# Patient Record
Sex: Female | Born: 1990 | Race: Black or African American | Hispanic: No | Marital: Single | Smoking: Never smoker
Health system: Southern US, Community
[De-identification: ages and names within clinical notes are randomized; demographics above are authoritative.]

## PROBLEM LIST (undated history)

## (undated) ENCOUNTER — Inpatient Hospital Stay (HOSPITAL_COMMUNITY): Payer: Self-pay

## (undated) DIAGNOSIS — G600 Hereditary motor and sensory neuropathy: Secondary | ICD-10-CM

## (undated) DIAGNOSIS — R42 Dizziness and giddiness: Secondary | ICD-10-CM

## (undated) DIAGNOSIS — O2340 Unspecified infection of urinary tract in pregnancy, unspecified trimester: Secondary | ICD-10-CM

## (undated) DIAGNOSIS — G7100 Muscular dystrophy, unspecified: Secondary | ICD-10-CM

## (undated) HISTORY — PX: TUBAL LIGATION: SHX77

---
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
## 2010-07-28 ENCOUNTER — Emergency Department (HOSPITAL_BASED_OUTPATIENT_CLINIC_OR_DEPARTMENT_OTHER)
Admission: EM | Admit: 2010-07-28 | Discharge: 2010-07-28 | Disposition: A | Discharge status: Home / Self Care | Payer: Self-pay | Attending: Emergency Medicine | Admitting: Emergency Medicine

## 2010-07-28 DIAGNOSIS — B9689 Other specified bacterial agents as the cause of diseases classified elsewhere: Secondary | ICD-10-CM | POA: Insufficient documentation

## 2010-07-28 DIAGNOSIS — N760 Acute vaginitis: Secondary | ICD-10-CM | POA: Insufficient documentation

## 2010-07-28 DIAGNOSIS — A499 Bacterial infection, unspecified: Secondary | ICD-10-CM | POA: Insufficient documentation

## 2010-07-28 DIAGNOSIS — O2390 Unspecified genitourinary tract infection in pregnancy, unspecified trimester: Secondary | ICD-10-CM | POA: Insufficient documentation

## 2010-07-28 DIAGNOSIS — G7109 Other specified muscular dystrophies: Secondary | ICD-10-CM | POA: Insufficient documentation

## 2010-07-28 LAB — URINALYSIS, ROUTINE W REFLEX MICROSCOPIC
Ketones, ur: 15 mg/dL — AB
Urine Glucose, Fasting: NEGATIVE mg/dL
pH: 6.5 (ref 5.0–8.0)

## 2010-07-28 LAB — WET PREP, GENITAL
Trich, Wet Prep: NONE SEEN
Yeast Wet Prep HPF POC: NONE SEEN

## 2010-07-31 LAB — GC/CHLAMYDIA PROBE AMP, GENITAL: Chlamydia, DNA Probe: NEGATIVE

## 2012-08-12 ENCOUNTER — Emergency Department (HOSPITAL_BASED_OUTPATIENT_CLINIC_OR_DEPARTMENT_OTHER): Payer: Medicaid Other

## 2012-08-12 ENCOUNTER — Emergency Department (HOSPITAL_BASED_OUTPATIENT_CLINIC_OR_DEPARTMENT_OTHER)
Admission: EM | Admit: 2012-08-12 | Discharge: 2012-08-12 | Disposition: A | Discharge status: Home / Self Care | Payer: Medicaid Other | Attending: Emergency Medicine | Admitting: Emergency Medicine

## 2012-08-12 ENCOUNTER — Encounter (HOSPITAL_BASED_OUTPATIENT_CLINIC_OR_DEPARTMENT_OTHER): Payer: Self-pay | Admitting: *Deleted

## 2012-08-12 DIAGNOSIS — Z3202 Encounter for pregnancy test, result negative: Secondary | ICD-10-CM | POA: Insufficient documentation

## 2012-08-12 DIAGNOSIS — K5900 Constipation, unspecified: Secondary | ICD-10-CM | POA: Insufficient documentation

## 2012-08-12 LAB — CBC WITH DIFFERENTIAL/PLATELET
Basophils Relative: 0 % (ref 0–1)
Eosinophils Relative: 1 % (ref 0–5)
HCT: 34.6 % — ABNORMAL LOW (ref 36.0–46.0)
Hemoglobin: 11 g/dL — ABNORMAL LOW (ref 12.0–15.0)
Lymphs Abs: 2.2 10*3/uL (ref 0.7–4.0)
MCH: 23.1 pg — ABNORMAL LOW (ref 26.0–34.0)
MCV: 72.7 fL — ABNORMAL LOW (ref 78.0–100.0)
Monocytes Absolute: 0.5 10*3/uL (ref 0.1–1.0)
Monocytes Relative: 5 % (ref 3–12)
Neutro Abs: 6.8 10*3/uL (ref 1.7–7.7)
RBC: 4.76 MIL/uL (ref 3.87–5.11)

## 2012-08-12 LAB — COMPREHENSIVE METABOLIC PANEL
ALT: 13 U/L (ref 0–35)
AST: 14 U/L (ref 0–37)
Albumin: 3.8 g/dL (ref 3.5–5.2)
Alkaline Phosphatase: 80 U/L (ref 39–117)
CO2: 26 mEq/L (ref 19–32)
Creatinine, Ser: 0.4 mg/dL — ABNORMAL LOW (ref 0.50–1.10)
Glucose, Bld: 96 mg/dL (ref 70–99)
Total Bilirubin: 0.4 mg/dL (ref 0.3–1.2)

## 2012-08-12 LAB — URINALYSIS, ROUTINE W REFLEX MICROSCOPIC
Bilirubin Urine: NEGATIVE
Glucose, UA: NEGATIVE mg/dL
Hgb urine dipstick: NEGATIVE
Specific Gravity, Urine: 1.017 (ref 1.005–1.030)
Urobilinogen, UA: 1 mg/dL (ref 0.0–1.0)
pH: 6 (ref 5.0–8.0)

## 2012-08-12 MED ORDER — POLYETHYLENE GLYCOL 3350 17 G PO PACK: 17.0000 g | PACK | Freq: Every day | ORAL | Status: DC

## 2012-08-12 NOTE — ED Notes (Signed)
RUQ pain x 1 week- denies n/v/d- states pain "comes and goes"

## 2012-08-12 NOTE — ED Provider Notes (Signed)
History     CSN: [id number]  Arrival date & time 08/12/12  1349   First MD Initiated Contact with Patient 08/12/12 1504      Chief Complaint  Patient presents with  . Abdominal Pain    (Consider location/radiation/quality/duration/timing/severity/associated sxs/prior treatment) Patient is a 22 y.o. female presenting with abdominal pain. The history is provided by the patient. No language interpreter was used.  Abdominal Pain Pain location:  RUQ Pain quality: sharp   Pain radiates to:  Does not radiate Timing:  Intermittent Chronicity:  New Context: not alcohol use   Relieved by:  Nothing Ineffective treatments:  None tried Associated symptoms: no diarrhea   Pt complains of pain in her right abdomen that comes and goes.  No fever, no chills, no vomitting or diarrhea. No back pain.  Pt denies pregnancy possibility  History reviewed. No pertinent past medical history.  History reviewed. No pertinent past surgical history.  No family history on file.  History  Substance Use Topics  . Smoking status: Never Smoker   . Smokeless tobacco: Never Used  . Alcohol Use: No    OB History   Grav Para Term Preterm Abortions TAB SAB Ect Mult Living                  Review of Systems  Gastrointestinal: Positive for abdominal pain. Negative for diarrhea.  All other systems reviewed and are negative.    Allergies  Review of patient's allergies indicates no known allergies.  Home Medications  No current outpatient prescriptions on file.  BP 105/63  Pulse 71  Temp(Src) 99 F (37.2 C) (Oral)  Resp 18  Ht 5\' 2"  (1.575 m)  Wt 153 lb (69.4 kg)  BMI 27.98 kg/m2  SpO2 100%  LMP 08/07/2012  Physical Exam  Nursing note and vitals reviewed. Constitutional: She appears well-developed and well-nourished.  HENT:  Head: Normocephalic.  Right Ear: External ear normal.  Left Ear: External ear normal.  Nose: Nose normal.  Mouth/Throat: Oropharynx is clear and moist.  Eyes:  Conjunctivae and EOM are normal. Pupils are equal, round, and reactive to light.  Neck: Normal range of motion. Neck supple.  Cardiovascular: Normal rate and normal heart sounds.   Pulmonary/Chest: Effort normal and breath sounds normal.  Abdominal: Soft. Bowel sounds are normal. There is no tenderness. There is no rebound.  Musculoskeletal: Normal range of motion.  Neurological: She is alert.  Skin: Skin is warm.  Psychiatric: She has a normal mood and affect.    ED Course  Procedures (including critical care time)  Labs Reviewed  PREGNANCY, URINE  URINALYSIS, ROUTINE W REFLEX MICROSCOPIC   No results found.   1. Constipation    Pt advised increase fluids,   Return to ED if symptoms worsen or cahnge   MDM  miralax        Elson Areas, PA-C 08/12/12 1750

## 2012-08-13 NOTE — ED Provider Notes (Signed)
Medical screening examination/treatment/procedure(s) were performed by non-physician practitioner and as supervising physician I was immediately available for consultation/collaboration.   Neomi Laidler, MD 08/13/12 1503 

## 2012-10-24 ENCOUNTER — Emergency Department (HOSPITAL_BASED_OUTPATIENT_CLINIC_OR_DEPARTMENT_OTHER)
Admission: EM | Admit: 2012-10-24 | Discharge: 2012-10-24 | Disposition: A | Discharge status: Home / Self Care | Payer: Medicaid Other | Attending: Emergency Medicine | Admitting: Emergency Medicine

## 2012-10-24 ENCOUNTER — Encounter (HOSPITAL_BASED_OUTPATIENT_CLINIC_OR_DEPARTMENT_OTHER): Payer: Self-pay | Admitting: *Deleted

## 2012-10-24 DIAGNOSIS — L2989 Other pruritus: Secondary | ICD-10-CM | POA: Insufficient documentation

## 2012-10-24 DIAGNOSIS — Y929 Unspecified place or not applicable: Secondary | ICD-10-CM | POA: Insufficient documentation

## 2012-10-24 DIAGNOSIS — IMO0001 Reserved for inherently not codable concepts without codable children: Secondary | ICD-10-CM | POA: Insufficient documentation

## 2012-10-24 DIAGNOSIS — W57XXXA Bitten or stung by nonvenomous insect and other nonvenomous arthropods, initial encounter: Secondary | ICD-10-CM

## 2012-10-24 DIAGNOSIS — Y939 Activity, unspecified: Secondary | ICD-10-CM | POA: Insufficient documentation

## 2012-10-24 DIAGNOSIS — R21 Rash and other nonspecific skin eruption: Secondary | ICD-10-CM | POA: Insufficient documentation

## 2012-10-24 DIAGNOSIS — L298 Other pruritus: Secondary | ICD-10-CM | POA: Insufficient documentation

## 2012-10-24 NOTE — ED Provider Notes (Signed)
History     CSN: [id number]  Arrival date & time 10/24/12  1191   First MD Initiated Contact with Patient 10/24/12 2045      Chief Complaint  Patient presents with  . Insect Bite    (Consider location/radiation/quality/duration/timing/severity/associated sxs/prior treatment) HPI Comments: Elizabeth Hooper is a 22 y/o F presenting to the ED with insect bite. Patient stated that she had a mark that started on her left deltoid approximately two days ago - stated that the mark has now gotten larger and is extremely pruritic starting yesterday. Patient stated that she woke up in the morning two days ago with a small bump that has gotten larger over the course of two days. Stated that she is constantly itching the site. Denied fever, chills, diaphoresis, headache, dizziness, blurred vision, loss of vision, tingling, numbness, heaviness to arm, weakness, oozing, discharge, facial swelling, facial pressure, chest pain, shortness of breathe, difficulty breathing, throat closing sensation.    The history is provided by the patient. No language interpreter was used.    History reviewed. No pertinent past medical history.  History reviewed. No pertinent past surgical history.  History reviewed. No pertinent family history.  History  Substance Use Topics  . Smoking status: Never Smoker   . Smokeless tobacco: Never Used  . Alcohol Use: No    OB History   Grav Para Term Preterm Abortions TAB SAB Ect Mult Living                  Review of Systems  Constitutional: Negative for fever, chills and fatigue.  HENT: Negative for ear pain, congestion, sore throat, rhinorrhea, trouble swallowing, neck pain and neck stiffness.   Eyes: Negative for photophobia, pain and visual disturbance.  Respiratory: Negative for cough, chest tightness and shortness of breath.   Cardiovascular: Negative for chest pain.  Gastrointestinal: Negative for nausea, vomiting, abdominal pain, diarrhea, constipation and  blood in stool.  Genitourinary: Negative for dysuria, hematuria, decreased urine volume, difficulty urinating and pelvic pain.  Musculoskeletal: Negative for back pain and arthralgias.  Skin: Positive for rash. Negative for wound.  Neurological: Negative for dizziness, weakness, light-headedness, numbness and headaches.  All other systems reviewed and are negative.    Allergies  Review of patient's allergies indicates no known allergies.  Home Medications   Current Outpatient Rx  Name  Route  Sig  Dispense  Refill  . polyethylene glycol (MIRALAX) packet   Oral   Take 17 g by mouth daily.   14 each   0     BP 109/67  Pulse 78  Temp(Src) 98.8 F (37.1 C) (Oral)  Resp 18  Ht 5\' 1"  (1.549 m)  Wt 151 lb (68.493 kg)  BMI 28.55 kg/m2  SpO2 99%  LMP 10/10/2012  Physical Exam  Nursing note and vitals reviewed. Constitutional: She is oriented to person, place, and time. She appears well-developed and well-nourished. No distress.  HENT:  Head: Normocephalic and atraumatic. No trismus in the jaw.  Mouth/Throat: Oropharynx is clear and moist. No oral lesions. No lacerations. No oropharyngeal exudate, posterior oropharyngeal edema, posterior oropharyngeal erythema or tonsillar abscesses.  Uvula midline, symmetrical elevation  Eyes: Conjunctivae and EOM are normal. Pupils are equal, round, and reactive to light. Right eye exhibits no discharge. Left eye exhibits no discharge.  Neck: Normal range of motion. Neck supple. No tracheal deviation present.  Negative nuchal rigidity Negative neck stiffness Negative lymphadenopathy  Cardiovascular: Normal rate, regular rhythm and normal heart sounds.  Exam reveals  no friction rub.   No murmur heard. Radial pulses 2+ bilaterally  Pulmonary/Chest: Effort normal and breath sounds normal. No respiratory distress. She has no wheezes. She has no rales.  Musculoskeletal: Normal range of motion. She exhibits no edema and no tenderness.       Left  upper arm: She exhibits swelling. She exhibits no tenderness, no bony tenderness, no edema, no deformity and no laceration.       Arms: Full ROM to upper extremities bilaterally Strength 5+/5+ to upper extremities bilaterally  Lymphadenopathy:    She has no cervical adenopathy.  Neurological: She is alert and oriented to person, place, and time. No cranial nerve deficit. She exhibits normal muscle tone. Coordination normal.  Cranial nerves II-XII grossly intact  Skin: Skin is warm and dry. She is not diaphoretic. There is erythema.  Psychiatric: She has a normal mood and affect. Her behavior is normal. Thought content normal.    ED Course  Procedures (including critical care time)  Labs Reviewed - No data to display No results found.   1. Insect bite       MDM  Patient afebrile, normotensive, non-tachycardic, adequate saturation on room air, alert and oriented. Suspected insect bite to left deltoid of unknown origin. Patient aseptic, non-toxic appearing, in no acute distress. Patient stable. Discharged patient. Discussed with patient to use cortisone cream to aid in itching relief. Referred patient to [facility] for re-check. Discussed with patient to remain hydrated and rest. Discussed with patient to monitor symptoms and if symptoms are to worsen or change to report back to the ED. Resource guide given.  Patient agreed to plan of care, understood, all questions answered.         Raymon Mutton, PA-C 10/25/12 [phone]

## 2012-10-24 NOTE — ED Notes (Signed)
Pt has 4 cm raised reddened area  X 2 to left upper arm onset Friday. +itchingf

## 2012-10-25 NOTE — ED Provider Notes (Signed)
Medical screening examination/treatment/procedure(s) were performed by non-physician practitioner and as supervising physician I was immediately available for consultation/collaboration.   Liann Spaeth B. Bernette Mayers, MD 10/25/12 [phone]

## 2012-10-27 ENCOUNTER — Encounter (HOSPITAL_BASED_OUTPATIENT_CLINIC_OR_DEPARTMENT_OTHER): Payer: Self-pay | Admitting: Emergency Medicine

## 2012-10-27 ENCOUNTER — Emergency Department (HOSPITAL_BASED_OUTPATIENT_CLINIC_OR_DEPARTMENT_OTHER)
Admission: EM | Admit: 2012-10-27 | Discharge: 2012-10-27 | Disposition: A | Discharge status: Home / Self Care | Payer: Medicaid Other | Attending: Emergency Medicine | Admitting: Emergency Medicine

## 2012-10-27 DIAGNOSIS — Y939 Activity, unspecified: Secondary | ICD-10-CM | POA: Insufficient documentation

## 2012-10-27 DIAGNOSIS — Y929 Unspecified place or not applicable: Secondary | ICD-10-CM | POA: Insufficient documentation

## 2012-10-27 DIAGNOSIS — Z8669 Personal history of other diseases of the nervous system and sense organs: Secondary | ICD-10-CM | POA: Insufficient documentation

## 2012-10-27 DIAGNOSIS — IMO0002 Reserved for concepts with insufficient information to code with codable children: Secondary | ICD-10-CM | POA: Insufficient documentation

## 2012-10-27 DIAGNOSIS — X58XXXA Exposure to other specified factors, initial encounter: Secondary | ICD-10-CM | POA: Insufficient documentation

## 2012-10-27 DIAGNOSIS — S76912A Strain of unspecified muscles, fascia and tendons at thigh level, left thigh, initial encounter: Secondary | ICD-10-CM

## 2012-10-27 HISTORY — DX: Muscular dystrophy, unspecified: G71.00

## 2012-10-27 MED ORDER — IBUPROFEN 800 MG PO TABS: 800.0000 mg | ORAL_TABLET | Freq: Three times a day (TID) | ORAL | Status: DC

## 2012-10-27 MED ORDER — IBUPROFEN 800 MG PO TABS
800.0000 mg | ORAL_TABLET | Freq: Once | ORAL | Status: AC
  Administered 2012-10-27: 800 mg via ORAL
  Filled 2012-10-27: qty 1

## 2012-10-27 NOTE — ED Provider Notes (Signed)
History     CSN: [id number]  Arrival date & time 10/27/12  7829   First MD Initiated Contact with Patient 10/27/12 2117      Chief Complaint  Patient presents with  . Leg Pain    (Consider location/radiation/quality/duration/timing/severity/associated sxs/prior treatment) Patient is a 22 y.o. female presenting with leg pain. The history is provided by the patient. No language interpreter was used.  Leg Pain Location:  Leg Time since incident:  1 day Injury: no   Leg location:  L leg Pain details:    Quality:  Aching   Radiates to:  Does not radiate   Severity:  Moderate   Onset quality:  Gradual   Timing:  Constant Prior injury to area:  No Relieved by:  Nothing Associated symptoms: no back pain    Pt complains of pain to left anterior thigh.  No injury, no fever.  (Pt seen here 3 days ago for bug bite) Pt seen at [facility] yesterday with negative chest pain evaluation Past Medical History  Diagnosis Date  . Muscular dystrophy     History reviewed. No pertinent past surgical history.  No family history on file.  History  Substance Use Topics  . Smoking status: Never Smoker   . Smokeless tobacco: Never Used  . Alcohol Use: No    OB History   Grav Para Term Preterm Abortions TAB SAB Ect Mult Living                  Review of Systems  Musculoskeletal: Positive for myalgias. Negative for back pain and joint swelling.  Skin: Negative for wound.  All other systems reviewed and are negative.    Allergies  Review of patient's allergies indicates no known allergies.  Home Medications   Current Outpatient Rx  Name  Route  Sig  Dispense  Refill  . polyethylene glycol (MIRALAX) packet   Oral   Take 17 g by mouth daily.   14 each   0     BP 119/64  Pulse 78  Temp(Src) 100 F (37.8 C) (Oral)  Resp 20  Ht 5\' 1"  (1.549 m)  Wt 161 lb (73.029 kg)  BMI 30.44 kg/m2  LMP 10/04/2012  Physical Exam  Nursing note and vitals reviewed. Constitutional:  She is oriented to person, place, and time. She appears well-developed and well-nourished.  HENT:  Head: Normocephalic and atraumatic.  Musculoskeletal: She exhibits tenderness.  Minimally tender   Neurological: She is alert and oriented to person, place, and time.  Skin: Skin is warm.  Psychiatric: She has a normal mood and affect.    ED Course  Procedures (including critical care time)  Labs Reviewed - No data to display No results found.   No diagnosis found.    MDM  Ace wrap, ibuprofen,   Follow up with Dr. Pearletha Forge if pain persist past one week        Elson Areas, [place] 10/27/12 2205

## 2012-10-27 NOTE — ED Provider Notes (Signed)
Medical screening examination/treatment/procedure(s) were performed by non-physician practitioner and as supervising physician I was immediately available for consultation/collaboration.  Doug Sou, MD 10/27/12 [phone]

## 2012-10-27 NOTE — ED Notes (Signed)
Left upper leg pain, from knee to groin, that started yesterday morning.  Sts she went to [facility] ED last night and was told her "blood was low" and needed to take iron pills.  Pain worse today.

## 2012-10-27 NOTE — ED Notes (Signed)
PA at bedside.

## 2013-02-18 LAB — OB RESULTS CONSOLE TSH: TSH: 0.126

## 2013-02-18 LAB — OB RESULTS CONSOLE ABO/RH: RH Type: POSITIVE

## 2013-02-18 LAB — OB RESULTS CONSOLE RPR: RPR: NONREACTIVE

## 2013-02-18 LAB — OB RESULTS CONSOLE PLATELET COUNT: Platelets: 256 10*3/uL

## 2013-02-18 LAB — OB RESULTS CONSOLE ANTIBODY SCREEN: Antibody Screen: NEGATIVE

## 2013-02-18 LAB — OB RESULTS CONSOLE GC/CHLAMYDIA
CHLAMYDIA, DNA PROBE: NEGATIVE
Gonorrhea: NEGATIVE

## 2013-02-18 LAB — OB RESULTS CONSOLE HGB/HCT, BLOOD
HCT: 34 %
Hemoglobin: 11.1 g/dL

## 2013-02-18 LAB — OB RESULTS CONSOLE RUBELLA ANTIBODY, IGM: RUBELLA: IMMUNE

## 2013-02-18 LAB — OB RESULTS CONSOLE HIV ANTIBODY (ROUTINE TESTING): HIV: NONREACTIVE

## 2013-02-18 LAB — OB RESULTS CONSOLE HEPATITIS B SURFACE ANTIGEN: Hepatitis B Surface Ag: NEGATIVE

## 2013-03-06 ENCOUNTER — Emergency Department (HOSPITAL_BASED_OUTPATIENT_CLINIC_OR_DEPARTMENT_OTHER)
Admission: EM | Admit: 2013-03-06 | Discharge: 2013-03-06 | Disposition: A | Discharge status: Home / Self Care | Payer: Medicaid Other | Attending: Emergency Medicine | Admitting: Emergency Medicine

## 2013-03-06 ENCOUNTER — Encounter (HOSPITAL_BASED_OUTPATIENT_CLINIC_OR_DEPARTMENT_OTHER): Payer: Self-pay

## 2013-03-06 DIAGNOSIS — N949 Unspecified condition associated with female genital organs and menstrual cycle: Secondary | ICD-10-CM | POA: Insufficient documentation

## 2013-03-06 DIAGNOSIS — Z3490 Encounter for supervision of normal pregnancy, unspecified, unspecified trimester: Secondary | ICD-10-CM

## 2013-03-06 DIAGNOSIS — R102 Pelvic and perineal pain: Secondary | ICD-10-CM

## 2013-03-06 DIAGNOSIS — O219 Vomiting of pregnancy, unspecified: Secondary | ICD-10-CM | POA: Insufficient documentation

## 2013-03-06 DIAGNOSIS — Z79899 Other long term (current) drug therapy: Secondary | ICD-10-CM | POA: Insufficient documentation

## 2013-03-06 LAB — URINE MICROSCOPIC-ADD ON

## 2013-03-06 LAB — CBC WITH DIFFERENTIAL/PLATELET
Eosinophils Relative: 0 % (ref 0–5)
HCT: 32.5 % — ABNORMAL LOW (ref 36.0–46.0)
Lymphocytes Relative: 21 % (ref 12–46)
Lymphs Abs: 1.9 10*3/uL (ref 0.7–4.0)
MCV: 71.3 fL — ABNORMAL LOW (ref 78.0–100.0)
Monocytes Absolute: 0.7 10*3/uL (ref 0.1–1.0)
RDW: 15.4 % (ref 11.5–15.5)
WBC: 8.9 10*3/uL (ref 4.0–10.5)

## 2013-03-06 LAB — WET PREP, GENITAL: Clue Cells Wet Prep HPF POC: NONE SEEN

## 2013-03-06 LAB — URINALYSIS, ROUTINE W REFLEX MICROSCOPIC
Glucose, UA: NEGATIVE mg/dL
Hgb urine dipstick: NEGATIVE
Protein, ur: NEGATIVE mg/dL

## 2013-03-06 MED ORDER — ONDANSETRON HCL 4 MG/2ML IJ SOLN
4.0000 mg | Freq: Once | INTRAMUSCULAR | Status: AC
  Administered 2013-03-06: 4 mg via INTRAVENOUS
  Filled 2013-03-06: qty 2

## 2013-03-06 MED ORDER — ONDANSETRON 8 MG PO TBDP: ORAL_TABLET | ORAL | Status: DC

## 2013-03-06 MED ORDER — SODIUM CHLORIDE 0.9 % IV BOLUS (SEPSIS)
1000.0000 mL | Freq: Once | INTRAVENOUS | Status: AC
  Administered 2013-03-06: 1000 mL via INTRAVENOUS

## 2013-03-06 NOTE — ED Provider Notes (Signed)
CSN: [id number]     Arrival date & time 03/06/13  1348 History   First MD Initiated Contact with Patient 03/06/13 1401     Chief Complaint  Patient presents with  . Abdominal Pain   (Consider location/radiation/quality/duration/timing/severity/associated sxs/prior Treatment) HPI Comments: Patient is a 22 year old otherwise healthy female. She is G2 P1001 at approximately [redacted] weeks gestation. She presents here with complaints of suprapubic abdominal pain that has been worsening for the past 2 days. This is associated with vomiting but she denies diarrhea or constipation. She denies fevers. She denies urinary complaints. There is no vaginal discharge or spotting. She tells me she had an ultrasound performed within the past 2 weeks at her OB office which revealed an intrauterine pregnancy.  Patient is a 22 y.o. female presenting with abdominal pain. The history is provided by the patient.  Abdominal Pain Pain location:  Suprapubic Pain quality: cramping   Pain radiates to:  Does not radiate Pain severity:  Moderate Onset quality:  Gradual Duration:  2 days Timing:  Constant Progression:  Worsening Chronicity:  New   Past Medical History  Diagnosis Date  . Muscular dystrophy   . Pregnant    History reviewed. No pertinent past surgical history. No family history on file. History  Substance Use Topics  . Smoking status: Never Smoker   . Smokeless tobacco: Never Used  . Alcohol Use: No   OB History   Grav Para Term Preterm Abortions TAB SAB Ect Mult Living   1              Review of Systems  Gastrointestinal: Positive for abdominal pain.  All other systems reviewed and are negative.    Allergies  Review of patient's allergies indicates no known allergies.  Home Medications   Current Outpatient Rx  Name  Route  Sig  Dispense  Refill  . Prenatal Vit-Fe Fumarate-FA (MULTIVITAMIN-PRENATAL) 27-0.8 MG TABS tablet   Oral   Take 1 tablet by mouth daily at 12 noon.           BP 103/62  Pulse 79  Temp(Src) 99.3 F (37.4 C) (Oral)  Resp 18  Wt 164 lb (74.39 kg)  BMI 31 kg/m2  SpO2 100%  LMP 12/23/2012 Physical Exam  Nursing note and vitals reviewed. Constitutional: She is oriented to person, place, and time. She appears well-developed and well-nourished. No distress.  HENT:  Head: Normocephalic and atraumatic.  Neck: Normal range of motion. Neck supple.  Cardiovascular: Normal rate and regular rhythm.  Exam reveals no gallop and no friction rub.   No murmur heard. Pulmonary/Chest: Effort normal and breath sounds normal. No respiratory distress. She has no wheezes.  Abdominal: Soft. Bowel sounds are normal. She exhibits no distension. There is tenderness.  There is mild tenderness to palpation in the suprapubic region without rebound or guarding.  Genitourinary: Vagina normal.  The external genitalia appears normal. There are no lesions. Vaginal mucosa is healthy appearing. There is slight yellowish discharge. The cervical os is closed and there is no bleeding. There is no adnexal tenderness or masses and there is no cervical motion tenderness.  Musculoskeletal: Normal range of motion.  Neurological: She is alert and oriented to person, place, and time.  Skin: Skin is warm and dry. She is not diaphoretic.    ED Course  Procedures (including critical care time) Labs Review Labs Reviewed  WET PREP, GENITAL  GC/CHLAMYDIA PROBE AMP  URINALYSIS, ROUTINE W REFLEX MICROSCOPIC  CBC WITH DIFFERENTIAL  HCG, QUANTITATIVE,  PREGNANCY   Imaging Review No results found.  MDM  No diagnosis found. Patient is a G2 P1 001 at [redacted] weeks gestation who presents with vomiting and pelvic pain. She had an ultrasound performed last week at the [facility] office and was told her pregnancy was intrauterine. Her lab work shows no evidence for urinary tract infection and wet prep reveals only few white cells. GC and Chlamydia are pending. She is feeling better with fluids and Zofran  and I believe she is stable for discharge to home. Instructions for return were given and she understands these. I will also advise she can take acetaminophen as needed for pain.    Geoffery Lyons, MD 03/06/13 1525

## 2013-03-06 NOTE — ED Notes (Signed)
Dr. Judd Lien notified of BP findings and states may proceed with discharge.  No new orders.

## 2013-03-06 NOTE — ED Notes (Addendum)
Patient here with lower abdominal pain and vomiting x 2 days. Reports that she is 2 months pregnant. denies urinary symptoms, denies discharge. States that she has had prenatal visit.

## 2013-03-06 NOTE — ED Notes (Signed)
Pt reports feeling her normal self, denies dizziness, SHOB, chest pain.

## 2013-03-07 LAB — GC/CHLAMYDIA PROBE AMP: GC Probe RNA: NEGATIVE

## 2013-04-21 LAB — GLUCOSE TOLERANCE, 1 HOUR: Glucose, 1 Hour GTT: 130

## 2013-04-21 LAB — SICKLE CELL SCREEN: Sickle Cell Screen: NEGATIVE

## 2013-07-08 ENCOUNTER — Encounter: Payer: Self-pay | Admitting: General Practice

## 2013-07-12 ENCOUNTER — Encounter: Payer: Self-pay | Admitting: *Deleted

## 2013-07-14 ENCOUNTER — Inpatient Hospital Stay (HOSPITAL_COMMUNITY)
Admission: AD | Admit: 2013-07-14 | Discharge: 2013-07-14 | Disposition: A | Discharge status: Home / Self Care | Payer: Medicaid Other | Source: Ambulatory Visit | Attending: Obstetrics and Gynecology | Admitting: Obstetrics and Gynecology

## 2013-07-14 ENCOUNTER — Encounter (HOSPITAL_COMMUNITY): Payer: Self-pay | Admitting: *Deleted

## 2013-07-14 ENCOUNTER — Inpatient Hospital Stay (HOSPITAL_COMMUNITY): Payer: Medicaid Other

## 2013-07-14 DIAGNOSIS — G7109 Other specified muscular dystrophies: Secondary | ICD-10-CM | POA: Insufficient documentation

## 2013-07-14 DIAGNOSIS — R109 Unspecified abdominal pain: Secondary | ICD-10-CM | POA: Insufficient documentation

## 2013-07-14 DIAGNOSIS — O4690 Antepartum hemorrhage, unspecified, unspecified trimester: Secondary | ICD-10-CM | POA: Insufficient documentation

## 2013-07-14 DIAGNOSIS — O99891 Other specified diseases and conditions complicating pregnancy: Secondary | ICD-10-CM | POA: Insufficient documentation

## 2013-07-14 DIAGNOSIS — O9989 Other specified diseases and conditions complicating pregnancy, childbirth and the puerperium: Secondary | ICD-10-CM

## 2013-07-14 LAB — WET PREP, GENITAL
CLUE CELLS WET PREP: NONE SEEN
Trich, Wet Prep: NONE SEEN
Yeast Wet Prep HPF POC: NONE SEEN

## 2013-07-14 LAB — URINALYSIS, ROUTINE W REFLEX MICROSCOPIC
Bilirubin Urine: NEGATIVE
Glucose, UA: NEGATIVE mg/dL
Ketones, ur: >80 mg/dL — AB
Nitrite: NEGATIVE
Protein, ur: NEGATIVE mg/dL
Specific Gravity, Urine: 1.015 (ref 1.005–1.030)
Urobilinogen, UA: 2 mg/dL — ABNORMAL HIGH (ref 0.0–1.0)
pH: 7 (ref 5.0–8.0)

## 2013-07-14 LAB — URINE MICROSCOPIC-ADD ON

## 2013-07-14 NOTE — MAU Provider Note (Signed)
Attestation of Attending Supervision of Advanced Practitioner (CNM/NP): Evaluation and management procedures were performed by the Advanced Practitioner under my supervision and collaboration.  I have reviewed the Advanced Practitioner's note and chart, and I agree with the management and plan.  Hisayo Delossantos 07/14/2013 2:45 PM

## 2013-07-14 NOTE — MAU Note (Signed)
Patient presents to MAU with c/o vaginal bleeding that was heavy per patient and running down leg. No bleeding noted on pad at this time. Denies LOF. Reports lower abdominal cramping. [facility] with [facility] 07/22/13

## 2013-07-14 NOTE — Discharge Instructions (Signed)
Vaginal Bleeding During Pregnancy, Third Trimester A small amount of bleeding (spotting) from the vagina is relatively common in pregnancy. Various things can cause bleeding or spotting in pregnancy. Sometimes the bleeding is normal and is not a problem. However, bleeding during the third trimester can also be a sign of something serious for the mother and the baby. Be sure to tell your health care provider about any vaginal bleeding right away.  Some possible causes of vaginal bleeding during the third trimester include:   The placenta may be partially or completely covering the opening to the cervix (placenta previa).   The placenta may have separated from the uterus (abruption of the placenta).   There may be an infection or growth on the cervix.   You may be starting labor, called discharging of the mucus plug.   The placenta may grow into the muscle layer of the uterus (placenta accreta).  HOME CARE INSTRUCTIONS  Watch your condition for any changes. The following actions may help to lessen any discomfort you are feeling:   Follow your health care provider's instructions for limiting your activity. If your health care provider orders bed rest, you may need to stay in bed and only get up to use the bathroom. However, your health care provider may allow you to continue light activity.  If needed, make plans for someone to help with your regular activities and responsibilities while you are on bed rest.  Keep track of the number of pads you use each day, how often you change pads, and how soaked (saturated) they are. Write this down.  Do not use tampons. Do not douche.  Do not have sexual intercourse or orgasms until approved by your health care provider.  Follow your health care provider's advice about lifting, driving, and physical activities.  If you pass any tissue from your vagina, save the tissue so you can show it to your health care provider.   Only take over-the-counter  or prescription medicines as directed by your health care provider.  Do not take aspirin because it can make you bleed.   Keep all follow-up appointments as directed by your health care provider. SEEK MEDICAL CARE IF:  You have any vaginal bleeding during any part of your pregnancy.  You have cramps or labor pains. SEEK IMMEDIATE MEDICAL CARE IF:   You have severe cramps or pain in your back or belly (abdomen).  You have a fever, not controlled by medicine.  You have chills.  You have a gush of fluid from the vagina.  You pass large clots or tissue from your vagina.  Your bleeding increases.  You feel lightheaded or weak.  You pass out.  You feel less movement or no movement of the baby.  MAKE SURE YOU:  Understand these instructions.  Will watch your condition.  Will get help right away if you are not doing well or get worse. Document Released: 08/17/2002 Document Revised: 03/17/2013 Document Reviewed: 02/01/2013 Exit[employer] Patient Information 2014 [employer].  

## 2013-07-14 NOTE — MAU Provider Note (Signed)
  History     CSN: [id number]  Arrival date and time: 07/14/13 1101   First Provider Initiated Contact with Patient 07/14/13 1125      Chief Complaint  Patient presents with  . Vaginal Bleeding  . Abdominal Pain   HPI Pt is a 23 y.o. G2P1001 at 5819w3d who presents with painless vaginal bleeding starting this morning. She reports a significant amount of bleeding running down her legs. She also reports a small black ball, possibly a blood clot that she passed this morning. She last had intercourse 2 days ago.  OB History   Grav Para Term Preterm Abortions TAB SAB Ect Mult Living   2 1 1       1       Past Medical History  Diagnosis Date  . Muscular dystrophy   . Pregnant     Past Surgical History  Procedure Laterality Date  . Cesarean section      History reviewed. No pertinent family history.  History  Substance Use Topics  . Smoking status: Never Smoker   . Smokeless tobacco: Never Used  . Alcohol Use: No    Allergies: No Known Allergies  Prescriptions prior to admission  Medication Sig Dispense Refill  . acetaminophen (TYLENOL) 500 MG tablet Take 500 mg by mouth every 6 (six) hours as needed.        Review of Systems  Gastrointestinal: Negative for abdominal pain and blood in stool.  Genitourinary: Negative for dysuria.       Vaginal bleeding, no LOF, discharge  All other systems reviewed and are negative.   Physical Exam   Blood pressure 114/66, pulse 119, temperature 98.1 F (36.7 C), temperature source Oral, resp. rate 18, height 5\' 1"  (1.549 m), weight 70.58 kg (155 lb 9.6 oz), last menstrual period 12/23/2012, SpO2 99.00%.  Physical Exam  Nursing note and vitals reviewed. Constitutional: She is oriented to person, place, and time. She appears well-developed and well-nourished. No distress.  HENT:  Head: Normocephalic and atraumatic.  Eyes: Conjunctivae are normal. Right eye exhibits no discharge. Left eye exhibits no discharge.  Cardiovascular:  Normal rate.   Respiratory: Effort normal.  GI: Soft. There is no tenderness.  Genitourinary: Vagina normal and uterus normal. No vaginal discharge found.  Musculoskeletal: She exhibits no edema and no tenderness.  Neurological: She is alert and oriented to person, place, and time.  Skin: Skin is warm and dry. She is not diaphoretic.  Psychiatric: She has a normal mood and affect. Her behavior is normal.    MAU Course  Procedures  MDM Wet prep: negative U/S: No previa or abruption visualized, posterior placenta above the cervical os Cervix closed GC/CT: pending  SSE repeated 1hr later with no acumulation of blood. Minimal pink discharge on speculum. No blood from os. No gross blood in vault.  Assessment and Plan  Pt is a 23 y.o. G2P1001 at 3019w3d who presents with painless vaginal bleeding starting this morning. No blood in vaginal vault, small amount on speculum, no blood from the cervical os. U/s without previa or abduption. Discharge home, return for recurrent/incresed bleeding, abdominal pain, regular painful contractions, decreased fetal movement. Pt to follow up in clinic next week.  Beverely Lowdamo, [place] 07/14/2013, 1:21 PM   I spoke with and examined patient and agree with resident's note and plan of care.  Tawana ScaleMichael Ryan Amellia Panik, MD OB Fellow 07/14/2013 1:47 PM

## 2013-07-15 LAB — URINE CULTURE: Colony Count: >=100000

## 2013-07-15 LAB — GC/CHLAMYDIA PROBE AMP
CT PROBE, AMP APTIMA: NEGATIVE
GC Probe RNA: NEGATIVE

## 2013-07-16 ENCOUNTER — Telehealth: Payer: Self-pay | Admitting: *Deleted

## 2013-07-16 ENCOUNTER — Other Ambulatory Visit: Payer: Self-pay | Admitting: Obstetrics and Gynecology

## 2013-07-16 ENCOUNTER — Encounter: Payer: Self-pay | Admitting: Obstetrics and Gynecology

## 2013-07-16 DIAGNOSIS — R8271 Bacteriuria: Secondary | ICD-10-CM | POA: Insufficient documentation

## 2013-07-16 MED ORDER — PENICILLIN V POTASSIUM 500 MG PO TABS
500.0000 mg | ORAL_TABLET | Freq: Four times a day (QID) | ORAL | Status: DC
Start: 2013-07-16 — End: 2013-08-16

## 2013-07-16 NOTE — Telephone Encounter (Signed)
Message copied by Dorothyann PengHAIZLIP, Krystyna Cleckley E on Fri Jul 16, 2013 11:21 AM ------      Message from: Catalina AntiguaONSTANT, PEGGY      Created: Fri Jul 16, 2013  8:50 AM       Please inform patient of positive UTI. Rx e-prescribed            Thanks            Peggy ------

## 2013-07-16 NOTE — Telephone Encounter (Signed)
Spoke with patient, informed of results and prescription.  Pt verbalizes understanding.

## 2013-07-22 ENCOUNTER — Ambulatory Visit (INDEPENDENT_AMBULATORY_CARE_PROVIDER_SITE_OTHER): Payer: Medicaid Other | Admitting: Obstetrics & Gynecology

## 2013-07-22 ENCOUNTER — Encounter: Payer: Self-pay | Admitting: Obstetrics & Gynecology

## 2013-07-22 VITALS — BP 102/60 | Temp 97.9°F | Wt 159.3 lb

## 2013-07-22 DIAGNOSIS — N390 Urinary tract infection, site not specified: Secondary | ICD-10-CM

## 2013-07-22 DIAGNOSIS — G600 Hereditary motor and sensory neuropathy: Secondary | ICD-10-CM

## 2013-07-22 DIAGNOSIS — O0993 Supervision of high risk pregnancy, unspecified, third trimester: Secondary | ICD-10-CM

## 2013-07-22 DIAGNOSIS — Z23 Encounter for immunization: Secondary | ICD-10-CM

## 2013-07-22 DIAGNOSIS — R8271 Bacteriuria: Secondary | ICD-10-CM

## 2013-07-22 DIAGNOSIS — O34219 Maternal care for unspecified type scar from previous cesarean delivery: Secondary | ICD-10-CM

## 2013-07-22 DIAGNOSIS — B951 Streptococcus, group B, as the cause of diseases classified elsewhere: Secondary | ICD-10-CM

## 2013-07-22 LAB — CBC
HCT: 27.3 % — ABNORMAL LOW (ref 36.0–46.0)
Hemoglobin: 8.9 g/dL — ABNORMAL LOW (ref 12.0–15.0)
MCH: 21.4 pg — ABNORMAL LOW (ref 26.0–34.0)
MCHC: 32.6 g/dL (ref 30.0–36.0)
MCV: 65.8 fL — ABNORMAL LOW (ref 78.0–100.0)
Platelets: 259 10*3/uL (ref 150–400)
RBC: 4.15 MIL/uL (ref 3.87–5.11)
RDW: 16 % — ABNORMAL HIGH (ref 11.5–15.5)
WBC: 9.4 10*3/uL (ref 4.0–10.5)

## 2013-07-22 LAB — POCT URINALYSIS DIP (DEVICE)
GLUCOSE, UA: NEGATIVE mg/dL
NITRITE: NEGATIVE
PH: 7 (ref 5.0–8.0)
Protein, ur: 30 mg/dL — AB
Specific Gravity, Urine: 1.025 (ref 1.005–1.030)
UROBILINOGEN UA: 4 mg/dL — AB (ref 0.0–1.0)

## 2013-07-22 MED ORDER — TETANUS-DIPHTH-ACELL PERTUSSIS 5-2.5-18.5 LF-MCG/0.5 IM SUSP: 0.5000 mL | Freq: Once | INTRAMUSCULAR | Status: DC

## 2013-07-22 NOTE — Progress Notes (Signed)
Has h/o Charcot Marie muscular dystrophy. Has a neurologist in [place]

## 2013-07-22 NOTE — Patient Instructions (Signed)
Vaginal Birth After Cesarean Delivery Vaginal birth after cesarean delivery (VBAC) is giving birth vaginally after previously delivering a baby by a cesarean. In the past, if a woman had a cesarean delivery, all births afterwards would be done by cesarean delivery. This is no longer true. It can be safe for the mother to try a vaginal delivery after having a cesarean delivery.  It is important to discuss VBAC with your health care provider early in the pregnancy so you can understand the risks, benefits, and options. It will give you time to decide what is best in your particular case. The final decision about whether to have a VBAC or repeat cesarean delivery should be between you and your health care provider. Any changes in your health or your baby's health during your pregnancy may make it necessary to change your initial decision about VBAC.  WOMEN WHO PLAN TO HAVE A VBAC SHOULD CHECK WITH THEIR HEALTH CARE PROVIDER TO BE SURE THAT:  The previous cesarean delivery was done with a low transverse uterine cut (incision) (not a vertical classical incision).   The birth canal is big enough for the baby.   There were no other operations on the uterus.   An electronic fetal monitor (EFM) will be on at all times during labor.   An operating room will be available and ready in case an emergency cesarean delivery is needed.   A health care provider and surgical nursing staff will be available at all times during labor to be ready to do an emergency delivery cesarean if necessary.   An anesthesiologist will be present in case an emergency cesarean delivery is needed.   The nursery is prepared and has adequate personnel and necessary equipment available to care for the baby in case of an emergency cesarean delivery. BENEFITS OF VBAC  Shorter stay in the hospital.   Avoidance of risks associated with cesarean delivery, such as:  Surgical complications, such as opening of the incision or  hernia in the incision.  Injury to other organs.  Fever. This can occur if an infection develops after surgery. It can also occur as a reaction to the medicine given to make you numb during the surgery.  Less blood loss and need for blood transfusions.  Lower risk of blood clots and infection.  Shorter recovery.   Decreased risk for having to remove the uterus (hysterectomy).   Decreased risk for the placenta to completely or partially cover the opening of the uterus (placenta previa) with a future pregnancy.   Decrease risk in future labor and delivery. RISKS OF A VBAC  Tearing (rupture) of the uterus. This is occurs in less than 1% of VBACs. The risk of this happening is higher if:  Steps are taken to begin the labor process (induce labor) or stimulate or strengthen contractions (augment labor).   Medicine is used to soften (ripen) the cervix.  Having to remove the uterus (hysterectomy) if it ruptures. VBAC SHOULD NOT BE DONE IF:  The previous cesarean delivery was done with a vertical (classical) or T-shaped incision or you do not know what kind of incision was made.   You had a ruptured uterus.   You have had certain types of surgery on your uterus, such as removal of uterine fibroids. Ask your health care provider about other types of surgeries that prevent you from having a VBAC.  You have certain medical or childbirth (obstetrical) problems.   There are problems with the baby.   You   have had two previous cesarean deliveries and no vaginal deliveries. OTHER FACTS TO KNOW ABOUT VBAC:  It is safe to have an epidural anesthetic with VBAC.   It is safe to turn the baby from a breech position (attempt an external cephalic version).   It is safe to try a VBAC with twins.   VBAC may not be successful if your baby weights 8.8 lb (4 kg) or more. However, weight predictions are not always accurate and should not be used alone to decide if VBAC is right for  you.  There is an increased failure rate if the time between the cesarean delivery and VBAC is less than 19 months.   Your health care provider may advise against a VBAC if you have preeclampsia (high blood pressure, protein in the urine, and swelling of face and extremities).   VBAC is often successful if you previously gave birth vaginally.   VBAC is often successful when the labor starts spontaneously before the due date.   Delivering a baby through a VBAC is similar to having a normal spontaneous vaginal delivery. Document Released: 11/17/2006 Document Revised: 03/17/2013 Document Reviewed: 12/24/2012 [employer] Patient Information 2014 [employer].  

## 2013-07-22 NOTE — Progress Notes (Signed)
P= 99 Edema in feet.  C/o of pain in legs but believes it may be due to muscular dystrophy.  C/o of occasional SOB after activity and short distance walking. Reports occasional dizziness and lightheadedness.  New OB packet given.  CBC, RPR and 1hr gtt today. Tdap today.

## 2013-07-23 LAB — PRESCRIPTION MONITORING PROFILE (19 PANEL)
Amphetamine/Meth: NEGATIVE ng/mL
BENZODIAZEPINE SCREEN, URINE: NEGATIVE ng/mL
BUPRENORPHINE, URINE: NEGATIVE ng/mL
Barbiturate Screen, Urine: NEGATIVE ng/mL
COCAINE METABOLITES: NEGATIVE ng/mL
Cannabinoid Scrn, Ur: NEGATIVE ng/mL
Carisoprodol, Urine: NEGATIVE ng/mL
Creatinine, Urine: 193.3 mg/dL (ref >20.0)
ECSTASY: NEGATIVE ng/mL
FENTANYL URINE: NEGATIVE ng/mL
MEPERIDINE UR: NEGATIVE ng/mL
Methadone Screen, Urine: NEGATIVE ng/mL
Methaqualone: NEGATIVE ng/mL
Nitrites, Initial: NEGATIVE ug/mL
Opiate Screen, Urine: NEGATIVE ng/mL
Oxycodone Screen, Ur: NEGATIVE ng/mL
PH URINE, INITIAL: 6.8 pH (ref 4.5–8.9)
Phencyclidine, Ur: NEGATIVE ng/mL
Propoxyphene: NEGATIVE ng/mL
Tapentadol, urine: NEGATIVE ng/mL
Tramadol Scrn, Ur: NEGATIVE ng/mL
Zolpidem, Urine: NEGATIVE ng/mL

## 2013-07-23 LAB — GLUCOSE TOLERANCE, 1 HOUR (50G) W/O FASTING: GLUCOSE 1 HOUR GTT: 104 mg/dL (ref 70–140)

## 2013-07-23 LAB — RPR

## 2013-07-23 NOTE — Progress Notes (Signed)
[facility]

## 2013-07-24 LAB — CULTURE, OB URINE: Colony Count: 85000

## 2013-08-05 ENCOUNTER — Encounter: Payer: Medicaid Other | Admitting: Obstetrics & Gynecology

## 2013-08-16 ENCOUNTER — Inpatient Hospital Stay (HOSPITAL_COMMUNITY)
Admission: AD | Admit: 2013-08-16 | Discharge: 2013-08-16 | Disposition: A | Discharge status: Home / Self Care | Payer: Medicaid Other | Source: Ambulatory Visit | Attending: Obstetrics & Gynecology | Admitting: Obstetrics & Gynecology

## 2013-08-16 ENCOUNTER — Encounter (HOSPITAL_COMMUNITY): Payer: Self-pay | Admitting: *Deleted

## 2013-08-16 DIAGNOSIS — M7918 Myalgia, other site: Secondary | ICD-10-CM

## 2013-08-16 DIAGNOSIS — G600 Hereditary motor and sensory neuropathy: Secondary | ICD-10-CM

## 2013-08-16 DIAGNOSIS — O0993 Supervision of high risk pregnancy, unspecified, third trimester: Secondary | ICD-10-CM

## 2013-08-16 DIAGNOSIS — IMO0001 Reserved for inherently not codable concepts without codable children: Secondary | ICD-10-CM

## 2013-08-16 DIAGNOSIS — O99891 Other specified diseases and conditions complicating pregnancy: Secondary | ICD-10-CM | POA: Insufficient documentation

## 2013-08-16 DIAGNOSIS — O9989 Other specified diseases and conditions complicating pregnancy, childbirth and the puerperium: Principal | ICD-10-CM

## 2013-08-16 DIAGNOSIS — R109 Unspecified abdominal pain: Secondary | ICD-10-CM | POA: Insufficient documentation

## 2013-08-16 DIAGNOSIS — G7109 Other specified muscular dystrophies: Secondary | ICD-10-CM | POA: Insufficient documentation

## 2013-08-16 HISTORY — DX: Unspecified infection of urinary tract in pregnancy, unspecified trimester: O23.40

## 2013-08-16 LAB — URINE MICROSCOPIC-ADD ON

## 2013-08-16 LAB — URINALYSIS, ROUTINE W REFLEX MICROSCOPIC
Bilirubin Urine: NEGATIVE
GLUCOSE, UA: NEGATIVE mg/dL
Hgb urine dipstick: NEGATIVE
Ketones, ur: NEGATIVE mg/dL
Nitrite: NEGATIVE
PH: 6.5 (ref 5.0–8.0)
PROTEIN: NEGATIVE mg/dL
Specific Gravity, Urine: <1.005 — ABNORMAL LOW (ref 1.005–1.030)
Urobilinogen, UA: 0.2 mg/dL (ref 0.0–1.0)

## 2013-08-16 MED ORDER — CYCLOBENZAPRINE HCL 5 MG PO TABS: 5.0000 mg | ORAL_TABLET | Freq: Three times a day (TID) | ORAL | Status: DC | PRN

## 2013-08-16 MED ORDER — CYCLOBENZAPRINE HCL 5 MG PO TABS
5.0000 mg | ORAL_TABLET | Freq: Once | ORAL | Status: AC
  Administered 2013-08-16: 5 mg via ORAL
  Filled 2013-08-16: qty 1

## 2013-08-16 NOTE — MAU Provider Note (Signed)
History     CSN: [id number]  Arrival date and time: 08/16/13 1439   First Provider Initiated Contact with Patient 08/16/13 1542      No chief complaint on file.  HPI Has been sufffering from a dull, tearing paing that seems to run from just under her R breast to her R anterior iliac crest, with sime radiations to her pubic area. Not associated with activities, also not relieved by stretching or tylenol. No VB, LOF, contractions. + FM. No recent trauma.   OB History   Grav Para Term Preterm Abortions TAB SAB Ect Mult Living   2 1 1  0      1      Past Medical History  Diagnosis Date  . Muscular dystrophy   . Pregnant   . UTI (urinary tract infection) during pregnancy     Past Surgical History  Procedure Laterality Date  . Cesarean section      Family History  Problem Relation Age of Onset  . Muscular dystrophy Father   . Hypertension Father     History  Substance Use Topics  . Smoking status: Never Smoker   . Smokeless tobacco: Never Used  . Alcohol Use: No    Allergies: No Known Allergies  Facility-administered medications prior to admission  Medication Dose Route Frequency Provider Last Rate Last Dose  . [DISCONTINUED] Tdap (BOOSTRIX) injection 0.5 mL  0.5 mL Intramuscular Once Adam Phenix, MD       Prescriptions prior to admission  Medication Sig Dispense Refill  . acetaminophen (TYLENOL) 500 MG tablet Take 500 mg by mouth every 6 (six) hours as needed for mild pain or headache.       . Prenatal Vit-Fe Fumarate-FA (PRENATAL MULTIVITAMIN) TABS tablet Take 1 tablet by mouth daily at 12 noon.        Review of Systems  Constitutional: Negative for fever and chills.  HENT: Negative for hearing loss.   Eyes: Negative for double vision and photophobia.  Respiratory: Negative for shortness of breath.   Cardiovascular: Negative for chest pain.  Gastrointestinal: Negative for nausea, vomiting and blood in stool.  Genitourinary: Negative for dysuria and  hematuria.  Musculoskeletal: Positive for back pain.  Neurological: Negative for headaches.   Physical Exam   Blood pressure 108/68, pulse 97, temperature 98.1 F (36.7 C), temperature source Oral, resp. rate 16, height 5\' 1"  (1.549 m), weight 70.308 kg (155 lb), last menstrual period 12/23/2012.  Physical Exam  Constitutional: She is oriented to person, place, and time. She appears well-developed and well-nourished. No distress.  HENT:  Head: Normocephalic and atraumatic.  Cardiovascular: Normal rate and regular rhythm.   Respiratory: Effort normal and breath sounds normal.  GI: There is no tenderness. There is no rebound and no guarding.  Musculoskeletal: She exhibits no edema.  Neurological: She is alert and oriented to person, place, and time.  Skin: No rash noted.  No bruising  FHT: 140s mod var mult accels >15x15 no decels Toco: no ctx  MAU Course  Procedures Results for orders placed during the hospital encounter of 08/16/13 (from the past 24 hour(s))  URINALYSIS, ROUTINE W REFLEX MICROSCOPIC     Status: Abnormal   Collection Time    08/16/13  4:30 PM      Result Value Ref Range   Color, Urine YELLOW  YELLOW   APPearance HAZY (*) CLEAR   Specific Gravity, Urine <1.005 (*) 1.005 - 1.030   pH 6.5  5.0 - 8.0  Glucose, UA NEGATIVE  NEGATIVE mg/dL   Hgb urine dipstick NEGATIVE  NEGATIVE   Bilirubin Urine NEGATIVE  NEGATIVE   Ketones, ur NEGATIVE  NEGATIVE mg/dL   Protein, ur NEGATIVE  NEGATIVE mg/dL   Urobilinogen, UA 0.2  0.0 - 1.0 mg/dL   Nitrite NEGATIVE  NEGATIVE   Leukocytes, UA SMALL (*) NEGATIVE  URINE MICROSCOPIC-ADD ON     Status: Abnormal   Collection Time    08/16/13  4:30 PM      Result Value Ref Range   Squamous Epithelial / LPF FEW (*) RARE   WBC, UA 3-6  <3 WBC/hpf   Bacteria, UA FEW (*) RARE   Assessment and Plan  # Abdominal/Flank Pain v nephrolithiasis; UA w/o blood, but trace LE.   - thought to be musculoskeletal in nature. FHT reassuring,  tocometer without contractions.   - enocurage to drink fluids (ie free water) -  urine culture neg -  Flexeril for pain  # F/u out patient as scheduled  Michaelene SongHall, Jonathan C 08/16/2013, 4:14 PM   I spoke with and examined patient and agree with resident's note and plan of care.  Tawana ScaleMichael Ryan Macrae Wiegman, MD OB Fellow 08/16/2013 10:36 PM

## 2013-08-16 NOTE — Discharge Instructions (Signed)
Musculoskeletal Pain °Musculoskeletal pain is muscle and boney aches and pains. These pains can occur in any part of the body. Your caregiver may treat you without knowing the cause of the pain. They may treat you if blood or urine tests, X-rays, and other tests were normal.  °CAUSES °There is often not a definite cause or reason for these pains. These pains may be caused by a type of germ (virus). The discomfort may also come from overuse. Overuse includes working out too hard when your body is not fit. Boney aches also come from weather changes. Bone is sensitive to atmospheric pressure changes. °HOME CARE INSTRUCTIONS  °· Ask when your test results will be ready. Make sure you get your test results. °· Only take over-the-counter or prescription medicines for pain, discomfort, or fever as directed by your caregiver. If you were given medications for your condition, do not drive, operate machinery or power tools, or sign legal documents for 24 hours. Do not drink alcohol. Do not take sleeping pills or other medications that may interfere with treatment. °· Continue all activities unless the activities cause more pain. When the pain lessens, slowly resume normal activities. Gradually increase the intensity and duration of the activities or exercise. °· During periods of severe pain, bed rest may be helpful. Lay or sit in any position that is comfortable. °· Putting ice on the injured area. °· Put ice in a bag. °· Place a towel between your skin and the bag. °· Leave the ice on for 15 to 20 minutes, 3 to 4 times a day. °· Follow up with your caregiver for continued problems and no reason can be found for the pain. If the pain becomes worse or does not go away, it may be necessary to repeat tests or do additional testing. Your caregiver may need to look further for a possible cause. °SEEK IMMEDIATE MEDICAL CARE IF: °· You have pain that is getting worse and is not relieved by medications. °· You develop chest pain  that is associated with shortness or breath, sweating, feeling sick to your stomach (nauseous), or throw up (vomit). °· Your pain becomes localized to the abdomen. °· You develop any new symptoms that seem different or that concern you. °MAKE SURE YOU:  °· Understand these instructions. °· Will watch your condition. °· Will get help right away if you are not doing well or get worse. °Document Released: 05/27/2005 Document Revised: 08/19/2011 Document Reviewed: 01/29/2013 °Exit[employer]® Patient Information ©2014 [employer]. ° °

## 2013-08-16 NOTE — MAU Note (Signed)
Pt came in today with c/o right sided abdominal pain from hip area to underneath right breast.  Says this pain has been present for the past four days.  Doesn't want anything for the pain, but missed her last appt and wanted to make sure that everything was okay.   Denies vag bleeding or low abd pain. Reports good fetal movement.

## 2013-08-17 LAB — URINE CULTURE
Colony Count: NO GROWTH
Culture: NO GROWTH
SPECIAL REQUESTS: NORMAL

## 2013-08-18 ENCOUNTER — Encounter: Payer: Self-pay | Admitting: Family Medicine

## 2013-08-18 ENCOUNTER — Ambulatory Visit (INDEPENDENT_AMBULATORY_CARE_PROVIDER_SITE_OTHER): Payer: Medicaid Other | Admitting: Family Medicine

## 2013-08-18 VITALS — BP 121/78 | Temp 98.4°F | Wt 158.3 lb

## 2013-08-18 DIAGNOSIS — G600 Hereditary motor and sensory neuropathy: Secondary | ICD-10-CM

## 2013-08-18 DIAGNOSIS — O34219 Maternal care for unspecified type scar from previous cesarean delivery: Secondary | ICD-10-CM

## 2013-08-18 DIAGNOSIS — O0990 Supervision of high risk pregnancy, unspecified, unspecified trimester: Secondary | ICD-10-CM

## 2013-08-18 DIAGNOSIS — O0993 Supervision of high risk pregnancy, unspecified, third trimester: Secondary | ICD-10-CM

## 2013-08-18 LAB — POCT URINALYSIS DIP (DEVICE)
BILIRUBIN URINE: NEGATIVE
GLUCOSE, UA: NEGATIVE mg/dL
Hgb urine dipstick: NEGATIVE
KETONES UR: 15 mg/dL — AB
Nitrite: NEGATIVE
PH: 8.5 — AB (ref 5.0–8.0)
Protein, ur: NEGATIVE mg/dL
Specific Gravity, Urine: 1.02 (ref 1.005–1.030)
Urobilinogen, UA: 2 mg/dL — ABNORMAL HIGH (ref 0.0–1.0)

## 2013-08-18 NOTE — Progress Notes (Signed)
P= 102 C/o of intermittent lower abdominal/pelvic pressure and braxton hicks.

## 2013-08-18 NOTE — Progress Notes (Signed)
+  FM, no lof, no vb, occ ctx  No other complaints  Elizabeth JunglingJessica Jirak is a 23 y.o. G2P1001 at 5440w3d here for ROB visit.  Discussed with Patient:  -Plans to breast feed.  All questions answered. -Continue prenatal vitamins. -Reviewed fetal kick counts Pt to perform daily at a time when the baby is active, lie laterally with both hands on belly in quiet room and count all movements (hiccups, shoulder rolls, obvious kicks, etc); pt is to report to clinic L&D for less than 10 movements felt in a one hour time period-pt told as soon as she counts 10 movements the count is complete.  - Routine precautions discussed (depression, infection s/s).   Patient provided with all pertinent phone numbers for emergencies. - RTC for any VB, regular, painful cramps/ctxs occurring at a rate of >2/10 min, fever (100.5 or higher), n/v/d, any pain that is unresolving or worsening, LOF, decreased fetal movement, CP, SOB, edema - RTC in 2 weeks for next appt.  Problems: Patient Active Problem List   Diagnosis Date Noted  . Supervision of high risk pregnancy in third trimester 07/22/2013  . 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 Previous cesarean section complicating pregnancy 07/22/2013  . GBS bacteriuria 07/16/2013    To Do: 1.   [ ]  Vaccines: recd [ ]  BCM: considering tubal - sign consent today [ ]  Readiness: baby has a place to sleep, car seat, other baby necessities.  Edu: [x ] PTL precautions; [ ]  BF class; [ ]  childbirth class; [ ]   BF counseling;

## 2013-08-18 NOTE — Patient Instructions (Signed)
Third Trimester of Pregnancy  The third trimester is from week 29 through week 42, months 7 through 9. The third trimester is a time when the fetus is growing rapidly. At the end of the ninth month, the fetus is about 20 inches in length and weighs 6 10 pounds.   BODY CHANGES  Your body goes through many changes during pregnancy. The changes vary from woman to woman.    Your weight will continue to increase. You can expect to gain 25 35 pounds (11 16 kg) by the end of the pregnancy.   You may begin to get stretch marks on your hips, abdomen, and breasts.   You may urinate more often because the fetus is moving lower into your pelvis and pressing on your bladder.   You may develop or continue to have heartburn as a result of your pregnancy.   You may develop constipation because certain hormones are causing the muscles that push waste through your intestines to slow down.   You may develop hemorrhoids or swollen, bulging veins (varicose veins).   You may have pelvic pain because of the weight gain and pregnancy hormones relaxing your joints between the bones in your pelvis. Back aches may result from over exertion of the muscles supporting your posture.   Your breasts will continue to grow and be tender. A yellow discharge may leak from your breasts called colostrum.   Your belly button may stick out.   You may feel short of breath because of your expanding uterus.   You may notice the fetus "dropping," or moving lower in your abdomen.   You may have a bloody mucus discharge. This usually occurs a few days to a week before labor begins.   Your cervix becomes thin and soft (effaced) near your due date.  WHAT TO EXPECT AT YOUR PRENATAL EXAMS   You will have prenatal exams every 2 weeks until week 36. Then, you will have weekly prenatal exams. During a routine prenatal visit:   You will be weighed to make sure you and the fetus are growing normally.   Your blood pressure is taken.   Your abdomen will be  measured to track your baby's growth.   The fetal heartbeat will be listened to.   Any test results from the previous visit will be discussed.   You may have a cervical check near your due date to see if you have effaced.  At around 36 weeks, your caregiver will check your cervix. At the same time, your caregiver will also perform a test on the secretions of the vaginal tissue. This test is to determine if a type of bacteria, Group B streptococcus, is present. Your caregiver will explain this further.  Your caregiver may ask you:   What your birth plan is.   How you are feeling.   If you are feeling the baby move.   If you have had any abnormal symptoms, such as leaking fluid, bleeding, severe headaches, or abdominal cramping.   If you have any questions.  Other tests or screenings that may be performed during your third trimester include:   Blood tests that check for low iron levels (anemia).   Fetal testing to check the health, activity level, and growth of the fetus. Testing is done if you have certain medical conditions or if there are problems during the pregnancy.  FALSE LABOR  You may feel small, irregular contractions that eventually go away. These are called Braxton Hicks contractions, or   false labor. Contractions may last for hours, days, or even weeks before true labor sets in. If contractions come at regular intervals, intensify, or become painful, it is best to be seen by your caregiver.   SIGNS OF LABOR    Menstrual-like cramps.   Contractions that are 5 minutes apart or less.   Contractions that start on the top of the uterus and spread down to the lower abdomen and back.   A sense of increased pelvic pressure or back pain.   A watery or bloody mucus discharge that comes from the vagina.  If you have any of these signs before the 37th week of pregnancy, call your caregiver right away. You need to go to the hospital to get checked immediately.  HOME CARE INSTRUCTIONS    Avoid all  smoking, herbs, alcohol, and unprescribed drugs. These chemicals affect the formation and growth of the baby.   Follow your caregiver's instructions regarding medicine use. There are medicines that are either safe or unsafe to take during pregnancy.   Exercise only as directed by your caregiver. Experiencing uterine cramps is a good sign to stop exercising.   Continue to eat regular, healthy meals.   Wear a good support bra for breast tenderness.   Do not use hot tubs, steam rooms, or saunas.   Wear your seat belt at all times when driving.   Avoid raw meat, uncooked cheese, cat litter boxes, and soil used by cats. These carry germs that can cause birth defects in the baby.   Take your prenatal vitamins.   Try taking a stool softener (if your caregiver approves) if you develop constipation. Eat more high-fiber foods, such as fresh vegetables or fruit and whole grains. Drink plenty of fluids to keep your urine clear or pale yellow.   Take warm sitz baths to soothe any pain or discomfort caused by hemorrhoids. Use hemorrhoid cream if your caregiver approves.   If you develop varicose veins, wear support hose. Elevate your feet for 15 minutes, 3 4 times a day. Limit salt in your diet.   Avoid heavy lifting, wear low heal shoes, and practice good posture.   Rest a lot with your legs elevated if you have leg cramps or low back pain.   Visit your dentist if you have not gone during your pregnancy. Use a soft toothbrush to brush your teeth and be gentle when you floss.   A sexual relationship may be continued unless your caregiver directs you otherwise.   Do not travel far distances unless it is absolutely necessary and only with the approval of your caregiver.   Take prenatal classes to understand, practice, and ask questions about the labor and delivery.   Make a trial run to the hospital.   Pack your hospital bag.   Prepare the baby's nursery.   Continue to go to all your prenatal visits as directed  by your caregiver.  SEEK MEDICAL CARE IF:   You are unsure if you are in labor or if your water has broken.   You have dizziness.   You have mild pelvic cramps, pelvic pressure, or nagging pain in your abdominal area.   You have persistent nausea, vomiting, or diarrhea.   You have a bad smelling vaginal discharge.   You have pain with urination.  SEEK IMMEDIATE MEDICAL CARE IF:    You have a fever.   You are leaking fluid from your vagina.   You have spotting or bleeding from your vagina.     You have severe abdominal cramping or pain.   You have rapid weight loss or gain.   You have shortness of breath with chest pain.   You notice sudden or extreme swelling of your face, hands, ankles, feet, or legs.   You have not felt your baby move in over an hour.   You have severe headaches that do not go away with medicine.   You have vision changes.  Document Released: 05/21/2001 Document Revised: 01/27/2013 Document Reviewed: 07/28/2012  [employer] Patient Information 2014 [employer].

## 2013-08-30 ENCOUNTER — Encounter: Payer: Self-pay | Admitting: *Deleted

## 2013-09-02 ENCOUNTER — Ambulatory Visit (INDEPENDENT_AMBULATORY_CARE_PROVIDER_SITE_OTHER): Payer: Medicaid Other | Admitting: Obstetrics & Gynecology

## 2013-09-02 VITALS — BP 107/70 | Temp 97.0°F | Wt 160.0 lb

## 2013-09-02 DIAGNOSIS — O0993 Supervision of high risk pregnancy, unspecified, third trimester: Secondary | ICD-10-CM

## 2013-09-02 DIAGNOSIS — O0990 Supervision of high risk pregnancy, unspecified, unspecified trimester: Secondary | ICD-10-CM

## 2013-09-02 DIAGNOSIS — O3421 Maternal care for scar from previous cesarean delivery: Secondary | ICD-10-CM

## 2013-09-02 DIAGNOSIS — O34219 Maternal care for unspecified type scar from previous cesarean delivery: Secondary | ICD-10-CM

## 2013-09-02 LAB — POCT URINALYSIS DIP (DEVICE)
BILIRUBIN URINE: NEGATIVE
Glucose, UA: NEGATIVE mg/dL
Hgb urine dipstick: NEGATIVE
Nitrite: NEGATIVE
PROTEIN: NEGATIVE mg/dL
Specific Gravity, Urine: 1.02 (ref 1.005–1.030)
Urobilinogen, UA: 4 mg/dL — ABNORMAL HIGH (ref 0.0–1.0)
pH: 7 (ref 5.0–8.0)

## 2013-09-02 NOTE — Progress Notes (Signed)
39 1 day repeat CS requested 10/04/13. Note from neuro 08/23/13 Philipp DeputyGrear, Karrie Elizabeth, MD - 08/23/2013 10:23 AM EDT Neuromuscular Clinic Return Patient Visit  Referring Physician: self [facility]  Chief Complaint: CMT  HPI: Ms. Elizabeth HeadyJessica Janae Hooper is a 23 y.o. 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 She was diagnosed by Dr. Clearance CootsHarper in 2009. At the time she was having problems with her walking and strength. Her father and grandmother also had similar symptoms. She had an EMG which showed extremely long distal latencies (Peroneal at 28) and velocities at 8. 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  She returns today to clinic and is [redacted] weeks pregnant. She reports that, as with her first pregnancy, she has gotten weaker towards the end of her pregnancy. She is having more trouble walking, but denies any falls in the past several weeks. She reports that she does not go out of the house much, but when she does she usually has help. [place] and reports that she has a C/S scheduled. She had a C/S with her first pregnancy two years ago without complications. She reports that she does not use a walker.   She was last seen in Nov 2012 following the delivery of her first child. She was also weak at that time, with proximal > distal weakness. CK was checked and was normal. She reports that about 4 months after delivering her first child her strength improved. Elizabeth Hooper is now 23 yo and receives PT for toe walking. She reports that he has been seeing Dr. Penni Homansutt; it is unclear from her report if he has undergone testing for the PMP22 duplication.   Past Medical History  Diagnosis Date  . Charcot-Marie-Tooth disease, type Ia [facility](HCC)    Past Surgical History  Procedure Laterality Date  . Cesarean section   No Known Allergies  Family History  Problem Relation Age of Onset  . Neuropathy Father  . Neuropathy  Paternal Uncle  . Neuropathy Paternal Grandmother   History  Substance Use Topics  . Smoking status: Never Smoker  . Smokeless tobacco: Not on file  . Alcohol Use: No    Review of Systems A complete review of systems was performed and was negative.  Physical Exam: BP 132/70  Pulse 115  Resp 14  Ht 1.549 m (5\' 1" )  Wt 72.576 kg (160 lb)  BMI 30.25 kg/m2  MENTAL STATUS EXAM:  - Orientation: Alert and oriented to person, place and time.  - Memory: Cooperative, follows commands well. Recent and remote memory normal.  - Attention, concentration: Attention span and concentration are normal.  - Language: Speech is clear and language is normal.  - Fund of knowledge: Aware of current events, vocabulary appropriate for patient age.   CRANIAL NERVES:  - CN 2 (Optic): Visual fields intact to confrontation, funduscopic examination without optic disk pallor or edema, retinal vessels are normal.  - CN 3,4,6 (EOM): Pupils equal and reactive to light and near full eye movement without nystagmus.  - CN 5 (Trigeminal): Facial sensation is normal, no weakness of masticatory muscles.  - CN 7 (Facial): No facial weakness or asymmetry.  - CN 8 (Auditory): Auditory acuity grossly normal.  - CN 9,10 (Glossophar): The uvula is midline, the palate elevates symmetrically.  - CN 11 (spinal access): Normal sternocleidomastoid and trapezius strength.  - CN 12 (Hypoglossal): The tongue is midline. No atrophy or fasciculations.  MOTOR:  - Deltoids: (R): 4 (L): 4 - Biceps: (R): 4 (L): 4 - Triceps: (R): 3 (L): 3 - Wrist Extensors: (R): 2 (L): 2 - Wrist Flexors: (R): 4 (L): 4 - Flexor Pollicis Longus: (R): 4 (L): 4 - Adductor Digiti Minimi: (R): 4 (L): 4 - Abductor Pollicis Brevis: (R): 4 (L): 4 - Hip Abductors (R): 4 (L): 4 - Hip Adductors (R): 4 (L): 4 - Hip Flexors: (R): 2 (L): 2 - Quadriceps: (R): 5 (L): 5 - Hamstrings: (R): 4 (L): 4 - Tibialis Anterior: (R): 3 (L): 3 - Medial Gastrocnemius:  (R): 5 (L): 5  Muscle Tone: Tone normal, muscle bulk decreased in APB and FDI bilaterally.   REFLEXES:  - Biceps: (R): 1+ (L): 1+ - Brachioradialis: (R): 0 (L): 0 - Patellar: (R): 0 (L): 0 - Achilles: (R): 0 (L): 0  COORDINATION: Intact finger-to-nose, slow rapid alternating movements, no tremor.   SENSATION: Intact to light touch throughout.   GAIT: Cannot stand without assistance, Steppage gait  Impression: 23 y/o female with CMT1A who is weak at [redacted] weeks pregnant. We do not have a good baseline exam for the patient, as she has only been seen in [facility] after delivery or during pregnancy, so its not exactly clear where or how much more she is actually weaker. She continued to have proximal > distal weakness, which is atypical for CMT and could be due to her pregnancy. She has an order for PT following pregnancy.   Recommendations: 1. Will see PT today 2. Encouraged her to use walker to prevent falls 3. Agree with OB that, given her history of CMT and weakness, scheduled C/S is likely the best delivery mode for this patient. We emphasized with the patient that the decision should be between her and her OB.  4. F/U one year; patient encouraged to call the clinic in the interim with any questions or concerns   It has been a pleasure to participate in the care of this patient.  Best Regards, Albertine Grates, MD, PhD Neuromuscular Fellow [facility]   Case discussed and patient seen and examined by Dr. Blain Pais, attending physician   I saw and evaluated the patient and reviewed the resident's note. I agree with the resident's findings and plan.   Electronically signed by: Tanna Furry, MD 08/23/2013 12:47 PM

## 2013-09-02 NOTE — Patient Instructions (Signed)
Third Trimester of Pregnancy  The third trimester is from week 29 through week 42, months 7 through 9. The third trimester is a time when the fetus is growing rapidly. At the end of the ninth month, the fetus is about 20 inches in length and weighs 6 10 pounds.   BODY CHANGES  Your body goes through many changes during pregnancy. The changes vary from woman to woman.    Your weight will continue to increase. You can expect to gain 25 35 pounds (11 16 kg) by the end of the pregnancy.   You may begin to get stretch marks on your hips, abdomen, and breasts.   You may urinate more often because the fetus is moving lower into your pelvis and pressing on your bladder.   You may develop or continue to have heartburn as a result of your pregnancy.   You may develop constipation because certain hormones are causing the muscles that push waste through your intestines to slow down.   You may develop hemorrhoids or swollen, bulging veins (varicose veins).   You may have pelvic pain because of the weight gain and pregnancy hormones relaxing your joints between the bones in your pelvis. Back aches may result from over exertion of the muscles supporting your posture.   Your breasts will continue to grow and be tender. A yellow discharge may leak from your breasts called colostrum.   Your belly button may stick out.   You may feel short of breath because of your expanding uterus.   You may notice the fetus "dropping," or moving lower in your abdomen.   You may have a bloody mucus discharge. This usually occurs a few days to a week before labor begins.   Your cervix becomes thin and soft (effaced) near your due date.  WHAT TO EXPECT AT YOUR PRENATAL EXAMS   You will have prenatal exams every 2 weeks until week 36. Then, you will have weekly prenatal exams. During a routine prenatal visit:   You will be weighed to make sure you and the fetus are growing normally.   Your blood pressure is taken.   Your abdomen will be  measured to track your baby's growth.   The fetal heartbeat will be listened to.   Any test results from the previous visit will be discussed.   You may have a cervical check near your due date to see if you have effaced.  At around 36 weeks, your caregiver will check your cervix. At the same time, your caregiver will also perform a test on the secretions of the vaginal tissue. This test is to determine if a type of bacteria, Group B streptococcus, is present. Your caregiver will explain this further.  Your caregiver may ask you:   What your birth plan is.   How you are feeling.   If you are feeling the baby move.   If you have had any abnormal symptoms, such as leaking fluid, bleeding, severe headaches, or abdominal cramping.   If you have any questions.  Other tests or screenings that may be performed during your third trimester include:   Blood tests that check for low iron levels (anemia).   Fetal testing to check the health, activity level, and growth of the fetus. Testing is done if you have certain medical conditions or if there are problems during the pregnancy.  FALSE LABOR  You may feel small, irregular contractions that eventually go away. These are called Braxton Hicks contractions, or   false labor. Contractions may last for hours, days, or even weeks before true labor sets in. If contractions come at regular intervals, intensify, or become painful, it is best to be seen by your caregiver.   SIGNS OF LABOR    Menstrual-like cramps.   Contractions that are 5 minutes apart or less.   Contractions that start on the top of the uterus and spread down to the lower abdomen and back.   A sense of increased pelvic pressure or back pain.   A watery or bloody mucus discharge that comes from the vagina.  If you have any of these signs before the 37th week of pregnancy, call your caregiver right away. You need to go to the hospital to get checked immediately.  HOME CARE INSTRUCTIONS    Avoid all  smoking, herbs, alcohol, and unprescribed drugs. These chemicals affect the formation and growth of the baby.   Follow your caregiver's instructions regarding medicine use. There are medicines that are either safe or unsafe to take during pregnancy.   Exercise only as directed by your caregiver. Experiencing uterine cramps is a good sign to stop exercising.   Continue to eat regular, healthy meals.   Wear a good support bra for breast tenderness.   Do not use hot tubs, steam rooms, or saunas.   Wear your seat belt at all times when driving.   Avoid raw meat, uncooked cheese, cat litter boxes, and soil used by cats. These carry germs that can cause birth defects in the baby.   Take your prenatal vitamins.   Try taking a stool softener (if your caregiver approves) if you develop constipation. Eat more high-fiber foods, such as fresh vegetables or fruit and whole grains. Drink plenty of fluids to keep your urine clear or pale yellow.   Take warm sitz baths to soothe any pain or discomfort caused by hemorrhoids. Use hemorrhoid cream if your caregiver approves.   If you develop varicose veins, wear support hose. Elevate your feet for 15 minutes, 3 4 times a day. Limit salt in your diet.   Avoid heavy lifting, wear low heal shoes, and practice good posture.   Rest a lot with your legs elevated if you have leg cramps or low back pain.   Visit your dentist if you have not gone during your pregnancy. Use a soft toothbrush to brush your teeth and be gentle when you floss.   A sexual relationship may be continued unless your caregiver directs you otherwise.   Do not travel far distances unless it is absolutely necessary and only with the approval of your caregiver.   Take prenatal classes to understand, practice, and ask questions about the labor and delivery.   Make a trial run to the hospital.   Pack your hospital bag.   Prepare the baby's nursery.   Continue to go to all your prenatal visits as directed  by your caregiver.  SEEK MEDICAL CARE IF:   You are unsure if you are in labor or if your water has broken.   You have dizziness.   You have mild pelvic cramps, pelvic pressure, or nagging pain in your abdominal area.   You have persistent nausea, vomiting, or diarrhea.   You have a bad smelling vaginal discharge.   You have pain with urination.  SEEK IMMEDIATE MEDICAL CARE IF:    You have a fever.   You are leaking fluid from your vagina.   You have spotting or bleeding from your vagina.     You have severe abdominal cramping or pain.   You have rapid weight loss or gain.   You have shortness of breath with chest pain.   You notice sudden or extreme swelling of your face, hands, ankles, feet, or legs.   You have not felt your baby move in over an hour.   You have severe headaches that do not go away with medicine.   You have vision changes.  Document Released: 05/21/2001 Document Revised: 01/27/2013 Document Reviewed: 07/28/2012  [employer] Patient Information 2014 [employer].

## 2013-09-02 NOTE — Progress Notes (Signed)
Pulse 100 Edema trace in feet.  C/o persistent pain across abdomen.

## 2013-09-09 ENCOUNTER — Encounter: Payer: Self-pay | Admitting: Family Medicine

## 2013-09-09 ENCOUNTER — Ambulatory Visit (INDEPENDENT_AMBULATORY_CARE_PROVIDER_SITE_OTHER): Payer: Medicaid Other | Admitting: Family Medicine

## 2013-09-09 VITALS — BP 106/72 | Temp 98.0°F | Wt 160.8 lb

## 2013-09-09 DIAGNOSIS — O3421 Maternal care for scar from previous cesarean delivery: Secondary | ICD-10-CM

## 2013-09-09 DIAGNOSIS — O0993 Supervision of high risk pregnancy, unspecified, third trimester: Secondary | ICD-10-CM

## 2013-09-09 DIAGNOSIS — O34219 Maternal care for unspecified type scar from previous cesarean delivery: Secondary | ICD-10-CM

## 2013-09-09 DIAGNOSIS — O0990 Supervision of high risk pregnancy, unspecified, unspecified trimester: Secondary | ICD-10-CM

## 2013-09-09 LAB — POCT URINALYSIS DIP (DEVICE)
GLUCOSE, UA: NEGATIVE mg/dL
Hgb urine dipstick: NEGATIVE
Ketones, ur: 15 mg/dL — AB
Nitrite: NEGATIVE
Protein, ur: 30 mg/dL — AB
Urobilinogen, UA: >=8 mg/dL (ref 0.0–1.0)
pH: 6.5 (ref 5.0–8.0)

## 2013-09-09 NOTE — Progress Notes (Signed)
CS scheduled GBS bacteuria

## 2013-09-09 NOTE — Progress Notes (Signed)
p=106 

## 2013-09-10 LAB — GC/CHLAMYDIA PROBE AMP
CT PROBE, AMP APTIMA: NEGATIVE
GC PROBE AMP APTIMA: NEGATIVE

## 2013-09-12 LAB — CULTURE, OB URINE

## 2013-09-13 ENCOUNTER — Telehealth: Payer: Self-pay | Admitting: *Deleted

## 2013-09-13 LAB — OB RESULTS CONSOLE GBS: GBS: POSITIVE

## 2013-09-13 MED ORDER — AMOXICILLIN 500 MG PO CAPS: 500.0000 mg | ORAL_CAPSULE | Freq: Three times a day (TID) | ORAL | Status: DC

## 2013-09-13 NOTE — Telephone Encounter (Addendum)
Message copied by Jill SideAY, Jarion Hawthorne L on Mon Sep 13, 2013  2:19 PM ------      Message from: Reva BoresPRATT, TANYA S      Created: Mon Sep 13, 2013  8:45 AM       Needs amox 500 mg tid x 7 days for GBS in urine  ------  Called pt and informed her of +UTI requiring antibiotic medication.  Rx sent to her pharmacy. Pt voiced understanding and will obtain prescription today.

## 2013-09-16 ENCOUNTER — Ambulatory Visit (INDEPENDENT_AMBULATORY_CARE_PROVIDER_SITE_OTHER): Payer: Medicaid Other | Admitting: Obstetrics & Gynecology

## 2013-09-16 VITALS — BP 113/74 | Temp 98.1°F | Wt 168.4 lb

## 2013-09-16 DIAGNOSIS — G600 Hereditary motor and sensory neuropathy: Secondary | ICD-10-CM

## 2013-09-16 DIAGNOSIS — R8271 Bacteriuria: Secondary | ICD-10-CM

## 2013-09-16 DIAGNOSIS — N390 Urinary tract infection, site not specified: Secondary | ICD-10-CM

## 2013-09-16 DIAGNOSIS — B951 Streptococcus, group B, as the cause of diseases classified elsewhere: Secondary | ICD-10-CM

## 2013-09-16 LAB — POCT URINALYSIS DIP (DEVICE)
Bilirubin Urine: NEGATIVE
Glucose, UA: NEGATIVE mg/dL
HGB URINE DIPSTICK: NEGATIVE
Ketones, ur: NEGATIVE mg/dL
LEUKOCYTES UA: NEGATIVE
Nitrite: NEGATIVE
PH: 7 (ref 5.0–8.0)
Protein, ur: NEGATIVE mg/dL
Specific Gravity, Urine: 1.015 (ref 1.005–1.030)
Urobilinogen, UA: 1 mg/dL (ref 0.0–1.0)

## 2013-09-16 NOTE — Progress Notes (Signed)
Few mild contractions  CS 4/27

## 2013-09-16 NOTE — Progress Notes (Signed)
P= 89 Edema in feet.  Intermittent lower abdominal/pelvic pain and braxton hicks.

## 2013-09-20 ENCOUNTER — Encounter (HOSPITAL_COMMUNITY): Payer: Self-pay | Admitting: Pharmacist

## 2013-09-20 NOTE — H&P (Signed)
  Elizabeth RipaJessica J Hooper is an 23 y.o. G2P1001 5921w1d female.    Chief Complaint: Needs C-section  HPI: Has had 1 prior C-section and desires elective repeat.  Past Medical History  Diagnosis Date  . Muscular dystrophy   . Pregnant   . UTI (urinary tract infection) during pregnancy     Past Surgical History  Procedure Laterality Date  . Cesarean section      Family History  Problem Relation Age of Onset  . Muscular dystrophy Father   . Hypertension Father    Social History:  reports that she has never smoked. She has never used smokeless tobacco. She reports that she does not drink alcohol or use illicit drugs.  Allergies: No Known Allergies  No current facility-administered medications on file prior to encounter.   Current Outpatient Prescriptions on File Prior to Encounter  Medication Sig Dispense Refill  . acetaminophen (TYLENOL) 500 MG tablet Take 500 mg by mouth every 6 (six) hours as needed for mild pain or headache.       . cyclobenzaprine (FLEXERIL) 5 MG tablet Take 1 tablet (5 mg total) by mouth 3 (three) times daily as needed for muscle spasms (muscle pain).  30 tablet  0  . Prenatal Vit-Fe Fumarate-FA (PRENATAL MULTIVITAMIN) TABS tablet Take 1 tablet by mouth daily at 12 noon.      . pseudoephedrine (SUDAFED) 30 MG tablet Take 30 mg by mouth every 4 (four) hours as needed for congestion.        A comprehensive review of systems was negative.  Last menstrual period 12/23/2012. LMP 12/23/2012 General appearance: alert, cooperative and appears stated age Head: Normocephalic, without obvious abnormality, atraumatic Neck: supple, symmetrical, trachea midline Lungs: normal effort Heart: regular rate and rhythm Abdomen: soft, fundus firm and non-tender Extremities: edema trace Pulses: 2+ and symmetric Skin: Skin color, texture, turgor normal. No rashes or lesions Neurologic: Motor: diminshed strength in lower extremeties   Lab Results  Component Value Date   WBC 9.4  07/22/2013   HGB 8.9* 07/22/2013   HCT 27.3* 07/22/2013   MCV 65.8* 07/22/2013   PLT 259 07/22/2013     Assessment/Plan Patient Active Problem List   Diagnosis Date Noted  . Supervision of high risk pregnancy in third trimester 07/22/2013    Priority: High  . Previous cesarean section complicating pregnancy 07/22/2013    Priority: Medium  . GBS bacteriuria 07/16/2013    Priority: Medium  . Charcot-Marie disease 07/22/2013   Repeat C-section and BTL. Risks include but are not limited to bleeding, infection, injury to surrounding structures, including bowel, bladder and ureters, blood clots, and death.  Likelihood of success is high Patient counseled, r.e. Risks benefits of BTL, including permanency of procedure, risk of failure(1:100), increased risk of ectopic.  Patient verbalized understanding and desires to proceed   Reva Boresanya S Ashby Moskal 09/20/2013, 8:43 AM

## 2013-09-20 NOTE — Anesthesia Preprocedure Evaluation (Addendum)
Anesthesia Evaluation  Patient identified by MRN, date of birth, ID band Patient awake    Reviewed: Allergy & Precautions, H&P , NPO status , Patient's Chart, lab work & pertinent test results  Airway Mallampati: II      Dental   Pulmonary  breath sounds clear to auscultation        Cardiovascular Exercise Tolerance: Good Rhythm:regular Rate:Normal     Neuro/Psych  Neuromuscular disease    GI/Hepatic   Endo/Other    Renal/GU      Musculoskeletal   Abdominal   Peds  Hematology   Anesthesia Other Findings   Reproductive/Obstetrics (+) Pregnancy                          Anesthesia Physical Anesthesia Plan  ASA: III and emergent  Anesthesia Plan: Epidural   Post-op Pain Management:    Induction:   Airway Management Planned:   Additional Equipment:   Intra-op Plan:   Post-operative Plan:   Informed Consent: I have reviewed the patients History and Physical, chart, labs and discussed the procedure including the risks, benefits and alternatives for the proposed anesthesia with the patient or authorized representative who has indicated his/her understanding and acceptance.     Plan Discussed with: Anesthesiologist, CRNA and Surgeon  Anesthesia Plan Comments:        Anesthesia Quick Evaluation Patient with mild charcot maire tooth [facility]1a [facility].  [facility] from [facility] workup are in chart per Dr. Shawnie PonsPratt.  Discussed risks and benefits of neuraxial anesthesia versus general and she would prefer to proceed with epidural understanding that she may have prolonged weakness and neurological symptoms.  She understands that it is possible but not likely to have worsening of her neurological symptoms that is permanent.  She understands and her questions were answered.  We discussed and considered spinal versus epidural verus general and I think there may be some  benefit to limiting the concentration of local in direct contact with her CNS so will proceed with epidural as the patient would prefer to proceed with neuraxial anesthesia.  I gave her some literature on this topic.

## 2013-09-23 ENCOUNTER — Encounter: Payer: Medicaid Other | Admitting: Obstetrics & Gynecology

## 2013-09-27 ENCOUNTER — Inpatient Hospital Stay (HOSPITAL_COMMUNITY): Admission: RE | Admit: 2013-09-27 | Payer: Medicaid Other | Source: Ambulatory Visit | Admitting: Family Medicine

## 2013-09-27 ENCOUNTER — Inpatient Hospital Stay (HOSPITAL_COMMUNITY)
Admission: AD | Admit: 2013-09-27 | Discharge: 2013-09-30 | DRG: 765 | Disposition: A | Discharge status: Home / Self Care | Payer: Medicaid Other | Source: Ambulatory Visit | Attending: Obstetrics & Gynecology | Admitting: Obstetrics & Gynecology

## 2013-09-27 ENCOUNTER — Encounter (HOSPITAL_COMMUNITY): Payer: Medicaid Other | Admitting: Anesthesiology

## 2013-09-27 ENCOUNTER — Inpatient Hospital Stay (HOSPITAL_COMMUNITY): Payer: Medicaid Other | Admitting: Anesthesiology

## 2013-09-27 ENCOUNTER — Encounter (HOSPITAL_COMMUNITY)
Admission: AD | Disposition: A | Discharge status: Home / Self Care | Payer: Self-pay | Source: Ambulatory Visit | Attending: Obstetrics & Gynecology

## 2013-09-27 ENCOUNTER — Encounter (HOSPITAL_COMMUNITY): Payer: Self-pay | Admitting: *Deleted

## 2013-09-27 DIAGNOSIS — G7109 Other specified muscular dystrophies: Secondary | ICD-10-CM | POA: Diagnosis present

## 2013-09-27 DIAGNOSIS — O99892 Other specified diseases and conditions complicating childbirth: Secondary | ICD-10-CM

## 2013-09-27 DIAGNOSIS — Z22330 Carrier of Group B streptococcus: Secondary | ICD-10-CM

## 2013-09-27 DIAGNOSIS — O99891 Other specified diseases and conditions complicating pregnancy: Secondary | ICD-10-CM | POA: Diagnosis present

## 2013-09-27 DIAGNOSIS — O9989 Other specified diseases and conditions complicating pregnancy, childbirth and the puerperium: Secondary | ICD-10-CM

## 2013-09-27 DIAGNOSIS — Z302 Encounter for sterilization: Secondary | ICD-10-CM

## 2013-09-27 DIAGNOSIS — G600 Hereditary motor and sensory neuropathy: Secondary | ICD-10-CM | POA: Diagnosis present

## 2013-09-27 DIAGNOSIS — O34219 Maternal care for unspecified type scar from previous cesarean delivery: Principal | ICD-10-CM | POA: Diagnosis present

## 2013-09-27 DIAGNOSIS — Z8249 Family history of ischemic heart disease and other diseases of the circulatory system: Secondary | ICD-10-CM | POA: Diagnosis not present

## 2013-09-27 DIAGNOSIS — O4290 Premature rupture of membranes, unspecified as to length of time between rupture and onset of labor, unspecified weeks of gestation: Secondary | ICD-10-CM | POA: Diagnosis present

## 2013-09-27 LAB — CBC
HCT: 31.4 % — ABNORMAL LOW (ref 36.0–46.0)
Hemoglobin: 9.4 g/dL — ABNORMAL LOW (ref 12.0–15.0)
MCH: 19.8 pg — ABNORMAL LOW (ref 26.0–34.0)
MCHC: 29.9 g/dL — ABNORMAL LOW (ref 30.0–36.0)
MCV: 66.1 fL — AB (ref 78.0–100.0)
PLATELETS: 270 10*3/uL (ref 150–400)
RBC: 4.75 MIL/uL (ref 3.87–5.11)
RDW: 17.7 % — ABNORMAL HIGH (ref 11.5–15.5)
WBC: 10.4 10*3/uL (ref 4.0–10.5)

## 2013-09-27 LAB — TYPE AND SCREEN
ABO/RH(D): O POS
ANTIBODY SCREEN: NEGATIVE

## 2013-09-27 LAB — ABO/RH: ABO/RH(D): O POS

## 2013-09-27 SURGERY — Surgical Case
Anesthesia: Epidural | Site: Abdomen

## 2013-09-27 SURGERY — Surgical Case
Anesthesia: Regional

## 2013-09-27 MED ORDER — NALOXONE HCL 1 MG/ML IJ SOLN: 1.0000 ug/kg/h | INTRAVENOUS | Status: DC | PRN

## 2013-09-27 MED ORDER — MENTHOL 3 MG MT LOZG: 1.0000 | LOZENGE | OROMUCOSAL | Status: DC | PRN

## 2013-09-27 MED ORDER — ONDANSETRON HCL 4 MG/2ML IJ SOLN
INTRAMUSCULAR | Status: AC
  Filled 2013-09-27: qty 2

## 2013-09-27 MED ORDER — PHENYLEPHRINE 40 MCG/ML (10ML) SYRINGE FOR IV PUSH (FOR BLOOD PRESSURE SUPPORT)
PREFILLED_SYRINGE | INTRAVENOUS | Status: AC
  Filled 2013-09-27: qty 5

## 2013-09-27 MED ORDER — SIMETHICONE 80 MG PO CHEW
80.0000 mg | CHEWABLE_TABLET | ORAL | Status: DC
  Administered 2013-09-28 – 2013-09-29 (×2): 80 mg via ORAL
  Filled 2013-09-27 (×2): qty 1

## 2013-09-27 MED ORDER — SCOPOLAMINE 1 MG/3DAYS TD PT72
MEDICATED_PATCH | TRANSDERMAL | Status: AC
  Administered 2013-09-27: 1.5 mg via TRANSDERMAL
  Filled 2013-09-27: qty 1

## 2013-09-27 MED ORDER — DIBUCAINE 1 % RE OINT: 1.0000 "application " | TOPICAL_OINTMENT | RECTAL | Status: DC | PRN

## 2013-09-27 MED ORDER — DIPHENHYDRAMINE HCL 25 MG PO CAPS
25.0000 mg | ORAL_CAPSULE | Freq: Four times a day (QID) | ORAL | Status: DC | PRN
  Administered 2013-09-28: 25 mg via ORAL
  Filled 2013-09-27: qty 1

## 2013-09-27 MED ORDER — MEPERIDINE HCL 25 MG/ML IJ SOLN: 6.2500 mg | INTRAMUSCULAR | Status: DC | PRN

## 2013-09-27 MED ORDER — WITCH HAZEL-GLYCERIN EX PADS: 1.0000 "application " | MEDICATED_PAD | CUTANEOUS | Status: DC | PRN

## 2013-09-27 MED ORDER — CITRIC ACID-SODIUM CITRATE 334-500 MG/5ML PO SOLN
ORAL | Status: AC
  Administered 2013-09-27: 30 mL
  Filled 2013-09-27: qty 15

## 2013-09-27 MED ORDER — METOCLOPRAMIDE HCL 5 MG/ML IJ SOLN
10.0000 mg | Freq: Three times a day (TID) | INTRAMUSCULAR | Status: DC | PRN
  Administered 2013-09-27: 10 mg via INTRAVENOUS

## 2013-09-27 MED ORDER — DIPHENHYDRAMINE HCL 50 MG/ML IJ SOLN: 25.0000 mg | INTRAMUSCULAR | Status: DC | PRN

## 2013-09-27 MED ORDER — LACTATED RINGERS IV SOLN
INTRAVENOUS | Status: DC
  Administered 2013-09-27 (×3): via INTRAVENOUS

## 2013-09-27 MED ORDER — TETANUS-DIPHTH-ACELL PERTUSSIS 5-2.5-18.5 LF-MCG/0.5 IM SUSP: 0.5000 mL | Freq: Once | INTRAMUSCULAR | Status: DC

## 2013-09-27 MED ORDER — PRENATAL MULTIVITAMIN CH
1.0000 | ORAL_TABLET | Freq: Every day | ORAL | Status: DC
  Administered 2013-09-28 – 2013-09-29 (×2): 1 via ORAL
  Filled 2013-09-27 (×2): qty 1

## 2013-09-27 MED ORDER — ONDANSETRON HCL 4 MG/2ML IJ SOLN: 4.0000 mg | INTRAMUSCULAR | Status: DC | PRN

## 2013-09-27 MED ORDER — SODIUM CHLORIDE 0.9 % IJ SOLN: 3.0000 mL | INTRAMUSCULAR | Status: DC | PRN

## 2013-09-27 MED ORDER — NALOXONE HCL 0.4 MG/ML IJ SOLN: 0.4000 mg | INTRAMUSCULAR | Status: DC | PRN

## 2013-09-27 MED ORDER — PHENYLEPHRINE 8 MG IN D5W 100 ML (0.08MG/ML) PREMIX OPTIME
INJECTION | INTRAVENOUS | Status: DC | PRN
  Administered 2013-09-27: 60 ug/min via INTRAVENOUS

## 2013-09-27 MED ORDER — PROMETHAZINE HCL 25 MG/ML IJ SOLN: 6.2500 mg | INTRAMUSCULAR | Status: DC | PRN

## 2013-09-27 MED ORDER — FENTANYL CITRATE 0.05 MG/ML IJ SOLN: 25.0000 ug | INTRAMUSCULAR | Status: DC | PRN

## 2013-09-27 MED ORDER — GLYCOPYRROLATE 0.2 MG/ML IJ SOLN
INTRAMUSCULAR | Status: DC | PRN
  Administered 2013-09-27: 0.1 mg via INTRAVENOUS

## 2013-09-27 MED ORDER — DIPHENHYDRAMINE HCL 25 MG PO CAPS: 25.0000 mg | ORAL_CAPSULE | ORAL | Status: DC | PRN

## 2013-09-27 MED ORDER — ONDANSETRON HCL 4 MG/2ML IJ SOLN
INTRAMUSCULAR | Status: DC | PRN
  Administered 2013-09-27: 4 mg via INTRAVENOUS

## 2013-09-27 MED ORDER — LIDOCAINE-EPINEPHRINE (PF) 2 %-1:200000 IJ SOLN
INTRAMUSCULAR | Status: DC | PRN
  Administered 2013-09-27: 3 mL via INTRADERMAL
  Administered 2013-09-27 (×2): 5 mL via INTRADERMAL
  Administered 2013-09-27: 7 mL via INTRADERMAL

## 2013-09-27 MED ORDER — NALBUPHINE HCL 10 MG/ML IJ SOLN: 5.0000 mg | INTRAMUSCULAR | Status: DC | PRN

## 2013-09-27 MED ORDER — OXYTOCIN 10 UNIT/ML IJ SOLN
INTRAMUSCULAR | Status: AC
  Filled 2013-09-27: qty 4

## 2013-09-27 MED ORDER — KETOROLAC TROMETHAMINE 30 MG/ML IJ SOLN
INTRAMUSCULAR | Status: AC
  Administered 2013-09-27: 30 mg via INTRAVENOUS
  Filled 2013-09-27: qty 1

## 2013-09-27 MED ORDER — ACETAMINOPHEN 500 MG PO TABS: 1000.0000 mg | ORAL_TABLET | Freq: Four times a day (QID) | ORAL | Status: DC

## 2013-09-27 MED ORDER — GLYCOPYRROLATE 0.2 MG/ML IJ SOLN
INTRAMUSCULAR | Status: AC
  Filled 2013-09-27: qty 1

## 2013-09-27 MED ORDER — CEFAZOLIN SODIUM-DEXTROSE 2-3 GM-% IV SOLR: 2.0000 g | Freq: Once | INTRAVENOUS | Status: DC

## 2013-09-27 MED ORDER — METOCLOPRAMIDE HCL 5 MG/ML IJ SOLN
INTRAMUSCULAR | Status: AC
Start: 2013-09-27 — End: 2013-09-28
  Filled 2013-09-27: qty 2

## 2013-09-27 MED ORDER — PHENYLEPHRINE HCL 10 MG/ML IJ SOLN
INTRAMUSCULAR | Status: DC | PRN
  Administered 2013-09-27: 40 ug via INTRAVENOUS
  Administered 2013-09-27: 80 ug via INTRAVENOUS
  Administered 2013-09-27: 40 ug via INTRAVENOUS

## 2013-09-27 MED ORDER — SENNOSIDES-DOCUSATE SODIUM 8.6-50 MG PO TABS
2.0000 | ORAL_TABLET | ORAL | Status: DC
  Administered 2013-09-28 – 2013-09-29 (×2): 2 via ORAL
  Filled 2013-09-27 (×2): qty 2

## 2013-09-27 MED ORDER — CEFAZOLIN SODIUM-DEXTROSE 2-3 GM-% IV SOLR
2.0000 g | INTRAVENOUS | Status: AC
  Administered 2013-09-27: 2 g via INTRAVENOUS
  Filled 2013-09-27: qty 50

## 2013-09-27 MED ORDER — PHENYLEPHRINE 8 MG IN D5W 100 ML (0.08MG/ML) PREMIX OPTIME
INJECTION | INTRAVENOUS | Status: AC
  Filled 2013-09-27: qty 100

## 2013-09-27 MED ORDER — OXYCODONE-ACETAMINOPHEN 5-325 MG PO TABS: 1.0000 | ORAL_TABLET | ORAL | Status: DC | PRN

## 2013-09-27 MED ORDER — KETOROLAC TROMETHAMINE 30 MG/ML IJ SOLN
30.0000 mg | Freq: Four times a day (QID) | INTRAMUSCULAR | Status: DC | PRN
  Administered 2013-09-27: 30 mg via INTRAVENOUS

## 2013-09-27 MED ORDER — MIDAZOLAM HCL 2 MG/2ML IJ SOLN: 0.5000 mg | Freq: Once | INTRAMUSCULAR | Status: DC | PRN

## 2013-09-27 MED ORDER — OXYTOCIN 40 UNITS IN LACTATED RINGERS INFUSION - SIMPLE MED: 62.5000 mL/h | INTRAVENOUS | Status: AC

## 2013-09-27 MED ORDER — SIMETHICONE 80 MG PO CHEW: 80.0000 mg | CHEWABLE_TABLET | ORAL | Status: DC | PRN

## 2013-09-27 MED ORDER — FENTANYL CITRATE 0.05 MG/ML IJ SOLN
INTRAMUSCULAR | Status: AC
  Filled 2013-09-27: qty 2

## 2013-09-27 MED ORDER — KETOROLAC TROMETHAMINE 30 MG/ML IJ SOLN: 30.0000 mg | Freq: Four times a day (QID) | INTRAMUSCULAR | Status: DC | PRN

## 2013-09-27 MED ORDER — LACTATED RINGERS IV SOLN
INTRAVENOUS | Status: DC
  Administered 2013-09-28 (×2): via INTRAVENOUS

## 2013-09-27 MED ORDER — IBUPROFEN 600 MG PO TABS: 600.0000 mg | ORAL_TABLET | Freq: Four times a day (QID) | ORAL | Status: DC | PRN

## 2013-09-27 MED ORDER — MORPHINE SULFATE (PF) 0.5 MG/ML IJ SOLN
INTRAMUSCULAR | Status: DC | PRN
  Administered 2013-09-27: 1 mg via INTRAVENOUS
  Administered 2013-09-27: 4 mg via EPIDURAL

## 2013-09-27 MED ORDER — IBUPROFEN 600 MG PO TABS
600.0000 mg | ORAL_TABLET | Freq: Four times a day (QID) | ORAL | Status: DC
  Administered 2013-09-28 – 2013-09-30 (×5): 600 mg via ORAL
  Filled 2013-09-27 (×7): qty 1

## 2013-09-27 MED ORDER — FENTANYL CITRATE 0.05 MG/ML IJ SOLN
INTRAMUSCULAR | Status: DC | PRN
  Administered 2013-09-27: 100 ug via INTRAVENOUS

## 2013-09-27 MED ORDER — LACTATED RINGERS IV SOLN
INTRAVENOUS | Status: DC | PRN
  Administered 2013-09-27: 16:00:00 via INTRAVENOUS

## 2013-09-27 MED ORDER — ONDANSETRON HCL 4 MG/2ML IJ SOLN: 4.0000 mg | Freq: Three times a day (TID) | INTRAMUSCULAR | Status: DC | PRN

## 2013-09-27 MED ORDER — SCOPOLAMINE 1 MG/3DAYS TD PT72
1.0000 | MEDICATED_PATCH | Freq: Once | TRANSDERMAL | Status: DC
  Administered 2013-09-27: 1.5 mg via TRANSDERMAL

## 2013-09-27 MED ORDER — DIPHENHYDRAMINE HCL 50 MG/ML IJ SOLN: 12.5000 mg | INTRAMUSCULAR | Status: DC | PRN

## 2013-09-27 MED ORDER — ONDANSETRON HCL 4 MG PO TABS: 4.0000 mg | ORAL_TABLET | ORAL | Status: DC | PRN

## 2013-09-27 MED ORDER — MEASLES, MUMPS & RUBELLA VAC ~~LOC~~ INJ
0.5000 mL | INJECTION | Freq: Once | SUBCUTANEOUS | Status: DC
  Filled 2013-09-27: qty 0.5

## 2013-09-27 MED ORDER — MORPHINE SULFATE 0.5 MG/ML IJ SOLN
INTRAMUSCULAR | Status: AC
  Filled 2013-09-27: qty 10

## 2013-09-27 MED ORDER — LANOLIN HYDROUS EX OINT: 1.0000 "application " | TOPICAL_OINTMENT | CUTANEOUS | Status: DC | PRN

## 2013-09-27 MED ORDER — OXYTOCIN 40 UNITS IN LACTATED RINGERS INFUSION - SIMPLE MED
INTRAVENOUS | Status: DC | PRN
  Administered 2013-09-27: 40 [IU] via INTRAVENOUS

## 2013-09-27 MED ORDER — LIDOCAINE HCL (PF) 1 % IJ SOLN: INTRAMUSCULAR | Status: DC | PRN

## 2013-09-27 MED ORDER — LACTATED RINGERS IV SOLN
INTRAVENOUS | Status: DC
Start: 2013-09-27 — End: 2013-09-27
  Administered 2013-09-27: 15:00:00 via INTRAVENOUS

## 2013-09-27 SURGICAL SUPPLY — 29 items
CLAMP CORD UMBIL (MISCELLANEOUS) IMPLANT
CLOTH BEACON ORANGE TIMEOUT ST (SAFETY) IMPLANT
CONTAINER PREFILL 10% NBF 15ML (MISCELLANEOUS) IMPLANT
DRAIN JACKSON PRT FLT 7MM (DRAIN) IMPLANT
DRAPE LG THREE QUARTER DISP (DRAPES) IMPLANT
DRSG OPSITE POSTOP 4X10 (GAUZE/BANDAGES/DRESSINGS) IMPLANT
DURAPREP 26ML APPLICATOR (WOUND CARE) IMPLANT
ELECT REM PT RETURN 9FT ADLT (ELECTROSURGICAL)
ELECTRODE REM PT RTRN 9FT ADLT (ELECTROSURGICAL) IMPLANT
EVACUATOR SILICONE 100CC (DRAIN) IMPLANT
EXTRACTOR VACUUM M CUP 4 TUBE (SUCTIONS) IMPLANT
EXTRACTOR VACUUM M CUP 4' TUBE (SUCTIONS)
GLOVE BIO SURGEON STRL SZ7 (GLOVE) IMPLANT
GLOVE BIOGEL PI IND STRL 7.0 (GLOVE) IMPLANT
GLOVE BIOGEL PI INDICATOR 7.0 (GLOVE)
GOWN STRL REUS W/TWL LRG LVL3 (GOWN DISPOSABLE) IMPLANT
KIT ABG SYR 3ML LUER SLIP (SYRINGE) IMPLANT
NEEDLE HYPO 25X5/8 SAFETYGLIDE (NEEDLE) IMPLANT
NS IRRIG 1000ML POUR BTL (IV SOLUTION) IMPLANT
PACK C SECTION WH (CUSTOM PROCEDURE TRAY) IMPLANT
PAD OB MATERNITY 4.3X12.25 (PERSONAL CARE ITEMS) IMPLANT
RTRCTR C-SECT PINK 25CM LRG (MISCELLANEOUS) IMPLANT
STAPLER VISISTAT 35W (STAPLE) IMPLANT
SUT VIC AB 0 CTX 36 (SUTURE)
SUT VIC AB 0 CTX36XBRD ANBCTRL (SUTURE) IMPLANT
SUT VIC AB 4-0 KS 27 (SUTURE) IMPLANT
TOWEL OR 17X24 6PK STRL BLUE (TOWEL DISPOSABLE) IMPLANT
TRAY FOLEY CATH 14FR (SET/KITS/TRAYS/PACK) IMPLANT
WATER STERILE IRR 1000ML POUR (IV SOLUTION) IMPLANT

## 2013-09-27 SURGICAL SUPPLY — 35 items
CLAMP CORD UMBIL (MISCELLANEOUS) IMPLANT
CLIP FILSHIE TUBAL LIGA STRL (Clip) ×3 IMPLANT
CLOSURE WOUND 1/2 X4 (GAUZE/BANDAGES/DRESSINGS) ×1
CLOTH BEACON ORANGE TIMEOUT ST (SAFETY) ×3 IMPLANT
CONTAINER PREFILL 10% NBF 15ML (MISCELLANEOUS) IMPLANT
DRAIN JACKSON PRT FLT 7MM (DRAIN) IMPLANT
DRAPE LG THREE QUARTER DISP (DRAPES) IMPLANT
DRSG OPSITE 6X11 MED (GAUZE/BANDAGES/DRESSINGS) ×3 IMPLANT
DRSG OPSITE POSTOP 4X10 (GAUZE/BANDAGES/DRESSINGS) ×3 IMPLANT
DURAPREP 26ML APPLICATOR (WOUND CARE) ×3 IMPLANT
ELECT REM PT RETURN 9FT ADLT (ELECTROSURGICAL) ×3
ELECTRODE REM PT RTRN 9FT ADLT (ELECTROSURGICAL) ×1 IMPLANT
EVACUATOR SILICONE 100CC (DRAIN) IMPLANT
EXTRACTOR VACUUM M CUP 4 TUBE (SUCTIONS) IMPLANT
EXTRACTOR VACUUM M CUP 4' TUBE (SUCTIONS)
GLOVE BIO SURGEON STRL SZ7 (GLOVE) ×3 IMPLANT
GLOVE BIOGEL PI IND STRL 7.0 (GLOVE) ×1 IMPLANT
GLOVE BIOGEL PI INDICATOR 7.0 (GLOVE) ×2
GOWN STRL REUS W/TWL LRG LVL3 (GOWN DISPOSABLE) ×6 IMPLANT
KIT ABG SYR 3ML LUER SLIP (SYRINGE) IMPLANT
NEEDLE HYPO 25X5/8 SAFETYGLIDE (NEEDLE) ×3 IMPLANT
NS IRRIG 1000ML POUR BTL (IV SOLUTION) ×3 IMPLANT
PACK C SECTION WH (CUSTOM PROCEDURE TRAY) ×3 IMPLANT
PAD ABD 8X7 1/2 STERILE (GAUZE/BANDAGES/DRESSINGS) ×3 IMPLANT
PAD OB MATERNITY 4.3X12.25 (PERSONAL CARE ITEMS) ×3 IMPLANT
RTRCTR C-SECT PINK 25CM LRG (MISCELLANEOUS) ×3 IMPLANT
SPONGE GAUZE 4X4 12PLY (GAUZE/BANDAGES/DRESSINGS) ×3 IMPLANT
STAPLER VISISTAT 35W (STAPLE) IMPLANT
STRIP CLOSURE SKIN 1/2X4 (GAUZE/BANDAGES/DRESSINGS) ×2 IMPLANT
SUT VIC AB 0 CTX 36 (SUTURE) ×10
SUT VIC AB 0 CTX36XBRD ANBCTRL (SUTURE) ×5 IMPLANT
SUT VIC AB 4-0 KS 27 (SUTURE) ×3 IMPLANT
TOWEL OR 17X24 6PK STRL BLUE (TOWEL DISPOSABLE) ×3 IMPLANT
TRAY FOLEY CATH 14FR (SET/KITS/TRAYS/PACK) ×3 IMPLANT
WATER STERILE IRR 1000ML POUR (IV SOLUTION) ×3 IMPLANT

## 2013-09-27 NOTE — H&P (Addendum)
SEE OB TRIAGE H&P entered 09/27/13 @ 1557 by Dr. Remonia RichterLeggett  Attestation of Attending Supervision of Fellow: Evaluation and management procedures were performed by the Fellow under my supervision and collaboration. I have reviewed the Fellow's note and chart, and I agree with the management and plan. Chief Complaint: No chief complaint on file.   None  HPI: Elizabeth RipaJessica J Hooper is a 23 y.o. G2P1001 at 960w1d pt of [facility] who presents to maternity admissions reporting leakage of clear fluid starting at 1pm today. Pt has dx of Charcot-Marie disease/muscular dystrophy. She had C/S for fetal distress with last pregnancy and desires repeat for this pregnancy. She reports [facility] her muscular dystrophy recommends delivery by C/S. She did have exacerbation of symptoms for 4 months following her last delivery requiring medical management. Her pregnancy was also complicated by diagnosis of Trichomonas in 2014, which was treated. She reports good fetal movement, denies regular contractions, vaginal bleeding, vaginal itching/burning, urinary symptoms, h/a, dizziness, n/v, or fever/chills.  Pregnancy Course:  Past Medical History:  Past Medical History   Diagnosis  Date   .  Muscular dystrophy    .  Pregnant    .  UTI (urinary tract infection) during pregnancy     Past obstetric history:  OB History   Gravida  Para  Term  Preterm  AB  SAB  TAB  Ectopic  Multiple  Living   2  1  1   0       1     #  Outcome  Date  GA  Lbr Len/2nd  Weight  Sex  Delivery  Anes  PTL  Lv   2  CUR            1  TRM  03/14/11  5046w0d   3.402 kg (7 lb 8 oz)  M  CS    Y      Past Surgical History:  Past Surgical History   Procedure  Laterality  Date   .  Cesarean section      Family History:  Family History   Problem  Relation  Age of Onset   .  Muscular dystrophy  Father    .  Hypertension  Father     Social History:  History   Substance Use Topics   .  Smoking status:  Never Smoker   .   Smokeless tobacco:  Never Used   .  Alcohol Use:  No    Allergies: No Known Allergies  Meds:  Prescriptions prior to admission   Medication  Sig  Dispense  Refill   .  acetaminophen (TYLENOL) 500 MG tablet  Take 500 mg by mouth every 6 (six) hours as needed for mild pain or headache.     Marland Kitchen.  amoxicillin (AMOXIL) 500 MG capsule  Take 1 capsule (500 mg total) by mouth 3 (three) times daily.  21 capsule  0   .  Prenatal Vit-Fe Fumarate-FA (PRENATAL MULTIVITAMIN) TABS tablet  Take 1 tablet by mouth daily at 12 noon.      ROS: Pertinent findings in history of present illness.  Physical Exam   Blood pressure 110/61, pulse 95, temperature 98.6 F (37 C), temperature source Oral, resp. rate 20, height 5\' 1"  (1.549 m), weight 76.204 kg (168 lb), last menstrual period 12/23/2012.  GENERAL: Well-developed, well-nourished female in no acute distress.  HEENT: normocephalic  HEART: normal rate  RESP: normal effort  ABDOMEN: Soft, non-tender, gravid appropriate for gestational  age  EXTREMITIES: Nontender, no edema  NEURO: alert and oriented   Dilation: 2  Effacement (%): 70  Cervical Position: Middle  Station: -2  Presentation: Vertex  Exam by:: L. Leftwich-Kirby CNM  Copious amount of clear fluid noted with cervical exam  FHT: Baseline 135 , moderate variability, accelerations present, no decelerations  Contractions: q 2-4 mins, mild to palpation  Labs:  Ferning positive  Assessment:  SROM, hx of previous C/S with desire to repeat  GBS positive  Plan:  Consult Dr Penne LashLeggett  Admit to OR for RLTCS  Abx prior to OR     Medication List     ASK your doctor about these medications       acetaminophen 500 MG tablet    Commonly known as: TYLENOL    Take 500 mg by mouth every 6 (six) hours as needed for mild pain or headache.    amoxicillin 500 MG capsule    Commonly known as: AMOXIL    Take 1 capsule (500 mg total) by mouth 3 (three) times daily.    prenatal multivitamin Tabs tablet     Take 1 tablet by mouth daily at 12 noon.      Sharen CounterLisa Leftwich-Kirby  Certified Nurse-Midwife  09/27/2013  3:02 PM  Pt seen and examined.  Attestation of Attending Supervision of Advanced Practitioner (CNM/NP): Evaluation and management procedures were performed by the Advanced Practitioner under my supervision and collaboration. I have reviewed the Advanced Practitioner's note and chart, and I agree with the management and plan.  Fredrich RomansKelly H Leggett  4:00 PM  The risks of cesarean section discussed with the patient included but were not limited to: bleeding which may require transfusion or reoperation; infection which may require antibiotics; injury to bowel, bladder, ureters or other surrounding organs; injury to the fetus; need for additional procedures including hysterectomy in the event of a life-threatening hemorrhage; placental abnormalities wth subsequent pregnancies, incisional problems, thromboembolic phenomenon and other postoperative/anesthesia complications. The patient concurred with the proposed plan, giving informed written consent for the procedure.  Patient desires permanent sterilization. Other reversible forms of contraception were discussed with patient; she declines all other modalities. Risks of procedure discussed with patient including but not limited to: risk of regret, permanence of method, bleeding, infection, injury to surrounding organs and need for additional procedures. Failure risk of 0.5-1% with increased risk of ectopic gestation if pregnancy occurs was also discussed with patient.  Patient verbalized understanding of these risks and wants to proceed with sterilization. Written informed consent obtained. To OR when ready.  Prenatal Transfer Tool   Maternal Diabetes: No  Genetic Screening: Normal  Maternal Ultrasounds/Referrals: Normal  Fetal Ultrasounds or other Referrals: None  Maternal Substance Abuse: No  Significant Maternal Medications: None  Significant Maternal  Lab Results: GBS positive  Mother has Charcot Alen BleacherMarie Tooth

## 2013-09-27 NOTE — Op Note (Signed)
Elizabeth Hooper PROCEDURE DATE: 09/27/2013  PREOPERATIVE DIAGNOSES: Intrauterine pregnancy at  4221w1d weeks gestation; previous uterine incision kronig; undesired fertility  POSTOPERATIVE DIAGNOSES: The same  PROCEDURE: Repeat Low Transverse Cesarean Section, Bilateral Tubal Sterilization using Filshie clips  SURGEON:  Dr. Penne LashLeggett ASSISTANT:  Dr. Ike Benedom   INDICATIONS: Elizabeth Hooper is a 23 y.o. [phone] at 4321w1d here for cesarean section and bilateral tubal sterilization secondary to the indications listed under preoperative diagnoses; please see preoperative note for further details.  The risks of surgery were discussed with the patient including but were not limited to: bleeding which may require transfusion or reoperation; infection which may require antibiotics; injury to bowel, bladder, ureters or other surrounding organs; injury to the fetus; need for additional procedures including hysterectomy in the event of a life-threatening hemorrhage; placental abnormalities wth subsequent pregnancies, incisional problems, thromboembolic phenomenon and other postoperative/anesthesia complications.  Patient also desires permanent sterilization.  Other reversible forms of contraception were discussed with patient; she declines all other modalities. Risks of procedure discussed with patient including but not limited to: risk of regret, permanence of method, bleeding, infection, injury to surrounding organs and need for additional procedures.  Failure risk of 0.5-1% with increased risk of ectopic gestation if pregnancy occurs was also discussed with patient.  The patient concurred with the proposed plan, giving informed written consent for the procedures.    FINDINGS:  Viable female infant in cephalic presentation.  Apgars 8 and 9.  Clear amniotic fluid.  Intact placenta, three vessel cord.  Normal uterus, fallopian tubes and ovaries bilaterally.  ANESTHESIA: Spinal INTRAVENOUS FLUIDS: 2000 ml ESTIMATED BLOOD  LOSS: 600 ml URINE OUTPUT:  300 ml SPECIMENS: Placenta sent to L&D COMPLICATIONS: None immediate  PROCEDURE IN DETAIL:  The patient preoperatively received intravenous antibiotics and had sequential compression devices applied to her lower extremities.   She was then taken to the operating room where spinal anesthesia was administered and was found to be adequate. She was then placed in a dorsal supine position with a leftward tilt, and prepped and draped in a sterile manner.  A foley catheter was placed into her bladder and attached to constant gravity.  After an adequate timeout was performed, a Pfannenstiel skin incision was made with scalpel and carried through to the underlying layer of fascia. The fascia was incised in the midline, and this incision was extended bilaterally using the Mayo scissors.  Kocher clamps were applied to the superior aspect of the fascial incision and the underlying rectus muscles were dissected off bluntly. A similar process was carried out on the inferior aspect of the fascial incision. The rectus muscles were separated in the midline bluntly and the peritoneum was entered bluntly. Attention was turned to the lower uterine segment where a low transverse hysterotomy was made with a scalpel and extended bilaterally bluntly.  The infant was successfully delivered, the cord was clamped and cut and the infant was handed over to awaiting neonatology team. Uterine massage was then administered, and the placenta delivered intact with a three-vessel cord. The uterus was then cleared of clot and debris.  The hysterotomy was closed with 0 Vicryl in a running locked fashion, and an imbricating layer was also placed with 0 Vicryl.  Attention was then turned to the fallopian tubes.  A Filshie clip was placed on both tubes, about 3 cm from the cornua, with care given to incorporate the underlying mesosalpinx on both sides, allowing for bilateral tubal sterilization. The pelvis was cleared of  all clot and debris. Hemostasis was confirmed on all surfaces.  The peritoneum was reapproximated using 0 Vicryl running stitches. The fascia was then closed using 0 Vicryl in a running fashion.  The subcutaneous layer was irrigated, and the skin was closed with a 4-0 Vicryl subcuticular stitch. The patient tolerated the procedure well. Sponge, lap, instrument and needle counts were correct x 2.  She was taken to the recovery room in stable condition.

## 2013-09-27 NOTE — H&P (Signed)
Attestation of Attending Supervision of Fellow: Evaluation and management procedures were performed by the Fellow under my supervision and collaboration.  I have reviewed the Fellow's note and chart, and I agree with the management and plan.    

## 2013-09-27 NOTE — Op Note (Signed)
Present for procedure. Attestation of Attending Supervision of Fellow: Evaluation and management procedures were performed by the Fellow under my supervision and collaboration. I have reviewed the Fellow's note and chart, and I agree with the management and plan.

## 2013-09-27 NOTE — Anesthesia Postprocedure Evaluation (Signed)
  Anesthesia Post-op Note  Patient: Elizabeth Hooper  Procedure(s) Performed: Procedure(s): CESAREAN SECTION WITH BILATERAL TUBAL LIGATION (N/A)  Last Vitals:  Filed Vitals:   09/27/13 1930  BP: 105/55  Pulse: 61  Temp:   Resp: 23     Patient is awake, responsive, moving her legs, and has signs of resolution of her numbness. Pain and nausea are reasonably well controlled. Vital signs are stable and clinically acceptable. Oxygen saturation is clinically acceptable. There are no apparent anesthetic complications at this time. Patient is ready for discharge.

## 2013-09-27 NOTE — MAU Note (Signed)
Per Wynona Caneshristine, RN charge [facility], pt to go to room 170 for triage/eval, possible prep for c/s.

## 2013-09-27 NOTE — OB Triage Provider Note (Addendum)
Chief Complaint:  No chief complaint on file.   None     HPI: Elizabeth Hooper is a 23 y.o. G2P1001 at 2858w1d pt of [facility] who presents to maternity admissions reporting leakage of clear fluid starting at 1pm today.  Pt has dx of Charcot-Marie disease/muscular dystrophy.  She had C/S for fetal distress with last pregnancy and desires repeat for this pregnancy. She reports that [facility] who manages her muscular dystrophy recommends delivery by C/S. She did have exacerbation of symptoms for 4 months following her last delivery requiring medical management.  Her pregnancy was also complicated by diagnosis of Trichomonas in 2014, which was treated.  She reports good fetal movement, denies regular contractions, vaginal bleeding, vaginal itching/burning, urinary symptoms, h/a, dizziness, n/v, or fever/chills.      Pregnancy Course:   Past Medical History: Past Medical History  Diagnosis Date  . Muscular dystrophy   . Pregnant   . UTI (urinary tract infection) during pregnancy     Past obstetric history: OB History  Gravida Para Term Preterm AB SAB TAB Ectopic Multiple Living  2 1 1  0      1    # Outcome Date GA Lbr Len/2nd Weight Sex Delivery Anes PTL Lv  2 CUR           1 TRM 03/14/11 5323w0d  3.402 kg (7 lb 8 oz) M CS   Y      Past Surgical History: Past Surgical History  Procedure Laterality Date  . Cesarean section      Family History: Family History  Problem Relation Age of Onset  . Muscular dystrophy Father   . Hypertension Father     Social History: History  Substance Use Topics  . Smoking status: Never Smoker   . Smokeless tobacco: Never Used  . Alcohol Use: No    Allergies: No Known Allergies  Meds:  Prescriptions prior to admission  Medication Sig Dispense Refill  . acetaminophen (TYLENOL) 500 MG tablet Take 500 mg by mouth every 6 (six) hours as needed for mild pain or headache.       Marland Kitchen. amoxicillin (AMOXIL) 500 MG capsule Take 1 capsule (500 mg  total) by mouth 3 (three) times daily.  21 capsule  0  . Prenatal Vit-Fe Fumarate-FA (PRENATAL MULTIVITAMIN) TABS tablet Take 1 tablet by mouth daily at 12 noon.        ROS: Pertinent findings in history of present illness.  Physical Exam  Blood pressure 110/61, pulse 95, temperature 98.6 F (37 C), temperature source Oral, resp. rate 20, height 5\' 1"  (1.549 m), weight 76.204 kg (168 lb), last menstrual period 12/23/2012. GENERAL: Well-developed, well-nourished female in no acute distress.  HEENT: normocephalic HEART: normal rate RESP: normal effort ABDOMEN: Soft, non-tender, gravid appropriate for gestational age EXTREMITIES: Nontender, no edema NEURO: alert and oriented   Dilation: 2 Effacement (%): 70 Cervical Position: Middle Station: -2 Presentation: Vertex Exam by:: L. Leftwich-Kirby CNM Copious amount of clear fluid noted with cervical exam  FHT:  Baseline 135 , moderate variability, accelerations present, no decelerations Contractions: q 2-4 mins, mild to palpation   Labs: Ferning positive  Assessment: SROM, hx of previous C/S with desire to repeat GBS positive  Plan: Consult Dr Penne LashLeggett Admit to OR for RLTCS Abx prior to OR      Medication List    ASK your doctor about these medications       acetaminophen 500 MG tablet  Commonly known as:  TYLENOL  Take 500 mg by mouth every 6 (six) hours as needed for mild pain or headache.     amoxicillin 500 MG capsule  Commonly known as:  AMOXIL  Take 1 capsule (500 mg total) by mouth 3 (three) times daily.     prenatal multivitamin Tabs tablet  Take 1 tablet by mouth daily at 12 noon.        Sharen CounterLisa Leftwich-Kirby Certified Nurse-Midwife 09/27/2013 3:02 PM  Pt seen and examined.  Attestation of Attending Supervision of Advanced Practitioner (CNM/NP): Evaluation and management procedures were performed by the Advanced Practitioner under my supervision and collaboration. I have reviewed the Advanced  Practitioner's note and chart, and I agree with the management and plan.  Fredrich RomansKelly H Hedwig Mcfall 4:00 PM    The risks of cesarean section discussed with the patient included but were not limited to: bleeding which may require transfusion or reoperation; infection which may require antibiotics; injury to bowel, bladder, ureters or other surrounding organs; injury to the fetus; need for additional procedures including hysterectomy in the event of a life-threatening hemorrhage; placental abnormalities wth subsequent pregnancies, incisional problems, thromboembolic phenomenon and other postoperative/anesthesia complications. The patient concurred with the proposed plan, giving informed written consent for the procedure.   Patient desires permanent sterilization.  Other reversible forms of contraception were discussed with patient; she declines all other modalities. Risks of procedure discussed with patient including but not limited to: risk of regret, permanence of method, bleeding, infection, injury to surrounding organs and need for additional procedures.  Failure risk of 0.5-1% with increased risk of ectopic gestation if pregnancy occurs was also discussed with patient.    Patient verbalized understanding of these risks and wants to proceed with sterilization.  Written informed consent obtained.  To OR when ready.   Prenatal Transfer Tool  Maternal Diabetes: No Genetic Screening: Normal Maternal Ultrasounds/Referrals: Normal Fetal Ultrasounds or other Referrals:  None Maternal Substance Abuse:  No Significant Maternal Medications:  None Significant Maternal Lab Results: GBS positive Mother has Charcot Alen BleacherMarie Tooth

## 2013-09-27 NOTE — Transfer of Care (Signed)
Immediate Anesthesia Transfer of Care Note  Patient: Elizabeth Hooper  Procedure(s) Performed: Procedure(s): CESAREAN SECTION WITH BILATERAL TUBAL LIGATION (N/A)  Patient Location: PACU  Anesthesia Type:Epidural  Level of Consciousness: awake, alert  and oriented  Airway & Oxygen Therapy: Patient Spontanous Breathing  Post-op Assessment: Report given to PACU RN and Post -op Vital signs reviewed and stable  Post vital signs: Reviewed and stable  Complications: No apparent anesthesia complications

## 2013-09-27 NOTE — Anesthesia Procedure Notes (Signed)
Epidural Patient location during procedure: OB Start time: 09/27/2013 3:54 PM  Staffing Anesthesiologist: Brayton CavesJACKSON, Judithann Villamar Performed by: anesthesiologist   Preanesthetic Checklist Completed: patient identified, site marked, surgical consent, pre-op evaluation, timeout performed, IV checked, risks and benefits discussed and monitors and equipment checked  Epidural Patient position: sitting Prep: site prepped and draped and DuraPrep Patient monitoring: continuous pulse ox and blood pressure Approach: midline Location: L3-L4 Injection technique: LOR air  Needle:  Needle type: Tuohy  Needle gauge: 17 G Needle length: 9 cm and 9 Needle insertion depth: 6 cm Catheter type: closed end flexible Catheter size: 19 Gauge Catheter at skin depth: 10 cm Test dose: negative  Assessment Events: blood not aspirated, injection not painful, no injection resistance, negative IV test and no paresthesia  Additional Notes Patient identified.  Risk benefits discussed including failed block, incomplete pain control, headache, nerve damage, paralysis, blood pressure changes, nausea, vomiting, reactions to medication both toxic or allergic, and postpartum back pain.  Patient expressed understanding and wished to proceed.  All questions were answered.  Sterile technique used throughout procedure and epidural site dressed with sterile barrier dressing. No paresthesia or other complications noted.The patient did not experience any signs of intravascular injection such as tinnitus or metallic taste in mouth nor signs of intrathecal spread such as rapid motor block. Please see nursing notes for vital signs.  No parasthesia with placement of needle but parasthesia with catheter therefore needle redirected and catheter placed without any sensation.

## 2013-09-27 NOTE — Progress Notes (Signed)
Notified pt in room 170 for eval of SROM. Will come see pt

## 2013-09-28 ENCOUNTER — Encounter (HOSPITAL_COMMUNITY): Payer: Self-pay

## 2013-09-28 ENCOUNTER — Encounter: Payer: Medicaid Other | Admitting: Family Medicine

## 2013-09-28 LAB — CBC
HCT: 26.8 % — ABNORMAL LOW (ref 36.0–46.0)
Hemoglobin: 8.1 g/dL — ABNORMAL LOW (ref 12.0–15.0)
MCH: 19.8 pg — ABNORMAL LOW (ref 26.0–34.0)
MCHC: 30.2 g/dL (ref 30.0–36.0)
MCV: 65.5 fL — ABNORMAL LOW (ref 78.0–100.0)
PLATELETS: 218 10*3/uL (ref 150–400)
RBC: 4.09 MIL/uL (ref 3.87–5.11)
RDW: 17.4 % — ABNORMAL HIGH (ref 11.5–15.5)
WBC: 16.3 10*3/uL — ABNORMAL HIGH (ref 4.0–10.5)

## 2013-09-28 LAB — RPR

## 2013-09-28 NOTE — Progress Notes (Signed)
Subjective: Postpartum Day 1: Cesarean Delivery and BTL Patient reports vomiting and no problems voiding, up ad lib. She has been able to hold some liquids down and currently denies nausea. She has IV fluids now. No flatus or BM yet. Pain is well controlled. Patient plans on bottle feeding and will wait to make decision on circumcision.  Objective: Vital signs in last 24 hours: Temp:  [97.3 F (36.3 C)-98.6 F (37 C)] 97.9 F (36.6 C) (04/21 0435) Pulse Rate:  [59-95] 60 (04/21 0435) Resp:  [16-35] 18 (04/21 0435) BP: (93-169)/(38-85) 133/70 mmHg (04/21 0435) SpO2:  [90 %-100 %] 100 % (04/21 0435) Weight:  [76.204 kg (168 lb)] 76.204 kg (168 lb) (04/20 1430)  Physical Exam:  General: alert, cooperative and appears stated age Lochia: appropriate Uterine Fundus: firm Incision: no significant drainage DVT Evaluation: No evidence of DVT seen on physical exam. Negative Homan's sign. No cords or calf tenderness. Does have bilateral trace edema.   Recent Labs  09/27/13 1435 09/28/13 0620  HGB 9.4* 8.1*  HCT 31.4* 26.8*    Assessment/Plan: Status post Cesarean section. Doing well postoperatively.  Recommended iron supplementation with stool softener.  Discharge home tomorrow with standard precautions and return to clinic in 4-6 weeks.  Wallis BambergMario Mani 09/28/2013, 7:50 AM  I have seen and examined this patient and agree with above documentation in the PA student's note.   Rulon AbideKeli Tahj Njoku, M.D. [facility] 09/28/2013 8:59 AM

## 2013-09-29 NOTE — Progress Notes (Signed)
Obstetric Discharge Summary Reason for Admission: cesarean section Prenatal Procedures: none Intrapartum Procedures: cesarean: low cervical, transverse and tubal ligation Postpartum Procedures: none Complications-Operative and Postpartum: none  Hospital Course: [name] is a 23 y.o. [id number] 7038w1d admitted for RLTCS with BTL. A viable baby boy was delivered without complication. Patient has no complaints. Her nausea and vomiting is resolved and she is tolerating PO. Pain is well controlled. She is bottle feeding and is undecided on circumcision. Of note, she denies flatus or BM post-operatively.  Delivery Note (Cesarean section: for additional information see op-note) At 4:21 PM a viable female was delivered via C-Section, Low Transverse (Presentation: ;  ).  APGAR: 8, 9; weight 7 lb 6.7 oz (3365 g).   Placenta status: Intact, Manual removal.  Cord: 3 vessels with the following complications: None.  Anesthesia: Epidural  Episiotomy: None Lacerations: None Suture Repair: vicryl Est. Blood Loss (mL):   Mom to postpartum.  Baby to Couplet care / Skin to Skin.  Elizabeth Hooper 09/29/2013, 9:07 AM     H/H: Lab Results  Component Value Date/Time   HGB 8.1* 09/28/2013  6:20 AM   HGB 11.1 02/18/2013   HCT 26.8* 09/28/2013  6:20 AM   HCT 34 02/18/2013    Filed Vitals:   09/29/13 0616  BP: 117/68  Pulse: 81  Temp: 98.3 F (36.8 C)  Resp: 17    Physical Exam: VSS NAD Abd: Appropriately tender, ND, Fundus @U -firm Incision: clear, dry and intact No c/c/e, Neg homan's sign, neg cords Lochia Appropriate  Discharge Diagnoses: Term Pregnancy-delivered via C-section  Discharge Information: Date: 12/20/2010 Activity: pelvic rest Diet: routine  Medications: None Breast feeding:  No: bottle feeding Condition: stable Instructions: refer to handout Discharge to: home      Medication List    ASK your doctor about these medications       acetaminophen 500 MG tablet  Commonly  known as:  TYLENOL  Take 500 mg by mouth every 6 (six) hours as needed for mild pain or headache.     amoxicillin 500 MG capsule  Commonly known as:  AMOXIL  Take 1 capsule (500 mg total) by mouth 3 (three) times daily.     prenatal multivitamin Tabs tablet  Take 1 tablet by mouth daily at 12 noon.         Elizabeth Hooper 09/29/2013,9:07 AM

## 2013-09-29 NOTE — Progress Notes (Signed)
Ur chart review completed.  

## 2013-09-30 MED ORDER — DOCUSATE SODIUM 100 MG PO CAPS: 100.0000 mg | ORAL_CAPSULE | Freq: Two times a day (BID) | ORAL | Status: DC

## 2013-09-30 MED ORDER — IBUPROFEN 600 MG PO TABS: 600.0000 mg | ORAL_TABLET | Freq: Four times a day (QID) | ORAL | Status: DC

## 2013-09-30 MED ORDER — OXYCODONE-ACETAMINOPHEN 5-325 MG PO TABS: 1.0000 | ORAL_TABLET | ORAL | Status: DC | PRN

## 2013-09-30 NOTE — Discharge Summary (Signed)
Obstetric Discharge Summary Reason for Admission: cesarean section Intrapartum Procedures: cesarean: low cervical, transverse and tubal ligation Postpartum Procedures: none Complications-Operative and Postpartum: none Hemoglobin  Date Value Ref Range Status  09/28/2013 8.1* 12.0 - 15.0 g/dL Final  1/61/09609/04/2013 45.411.1   Final     HCT  Date Value Ref Range Status  09/28/2013 26.8* 36.0 - 46.0 % Final  02/18/2013 34   Final    Physical Exam:  General: alert and no distress Lochia: appropriate Uterine Fundus: firm Incision: healing well, Covered in clean, dry dressing. Abdomen appropriately tender DVT Evaluation: No evidence of DVT seen on physical exam. Negative Homan's sign.  Discharge Diagnoses: Term Pregnancy-delivered  Discharge Summary: 23 y.o. G2P1001 at 6326w1d pt of [facility] with a h/o Pt has dx of Charcot-Marie disease/muscular dystrophy who presented to maternity admissions reporting leakage of clear fluid  She had C/S for fetal distress with first pregnancy and desired repeat with tubal ligation.  She was admitted and underwent RLTCS and Tubal Ligation on 09/27/13. Delivery of a viable female infant in cephalic presentation. Apgars 8 and 9. Clear amniotic fluid. Intact placenta, three vessel cord. Normal uterus, fallopian tubes and ovaries bilaterally. Patient was stable during postoperative period and was discharged home on POD#3.    Discharge Information: Date: 09/30/2013 Activity: pelvic rest Diet: routine Medications: Ibuprofen, Colace and Percocet Condition: stable Instructions: refer to practice specific booklet Discharge to: home Follow-up Information   [facility] Schedule an appointment as soon as possible for a visit in 4 weeks. (Call to schedule appointment for postpartum follow-up)    Specialty:  Obstetrics and Gynecology   Contact information:   [address] [place] [id number] [phone]      Newborn Data: Live born female   Birth Weight: 7 lb 6.7 oz (3365 g) APGAR: 8, 9  Home with mother.  Elizabeth JacobsonRobyn H Hooper 09/30/2013, 8:07 AM  I have seen this patient and agree with the above resident's note.  LEFTWICH-KIRBY, Kimberlee Shoun Certified Nurse-Midwife

## 2013-10-01 ENCOUNTER — Other Ambulatory Visit (HOSPITAL_COMMUNITY): Payer: Medicaid Other

## 2013-10-07 ENCOUNTER — Emergency Department (HOSPITAL_BASED_OUTPATIENT_CLINIC_OR_DEPARTMENT_OTHER)
Admission: EM | Admit: 2013-10-07 | Discharge: 2013-10-07 | Disposition: A | Discharge status: Home / Self Care | Payer: Medicaid Other | Attending: Emergency Medicine | Admitting: Emergency Medicine

## 2013-10-07 ENCOUNTER — Emergency Department (HOSPITAL_BASED_OUTPATIENT_CLINIC_OR_DEPARTMENT_OTHER): Payer: Medicaid Other

## 2013-10-07 ENCOUNTER — Encounter (HOSPITAL_BASED_OUTPATIENT_CLINIC_OR_DEPARTMENT_OTHER): Payer: Self-pay | Admitting: Emergency Medicine

## 2013-10-07 DIAGNOSIS — N898 Other specified noninflammatory disorders of vagina: Secondary | ICD-10-CM | POA: Insufficient documentation

## 2013-10-07 DIAGNOSIS — R5383 Other fatigue: Secondary | ICD-10-CM

## 2013-10-07 DIAGNOSIS — R109 Unspecified abdominal pain: Secondary | ICD-10-CM | POA: Insufficient documentation

## 2013-10-07 DIAGNOSIS — Z98891 History of uterine scar from previous surgery: Secondary | ICD-10-CM

## 2013-10-07 DIAGNOSIS — R5381 Other malaise: Secondary | ICD-10-CM | POA: Insufficient documentation

## 2013-10-07 DIAGNOSIS — Z79899 Other long term (current) drug therapy: Secondary | ICD-10-CM | POA: Insufficient documentation

## 2013-10-07 DIAGNOSIS — O909 Complication of the puerperium, unspecified: Principal | ICD-10-CM

## 2013-10-07 DIAGNOSIS — N939 Abnormal uterine and vaginal bleeding, unspecified: Secondary | ICD-10-CM

## 2013-10-07 DIAGNOSIS — Z8669 Personal history of other diseases of the nervous system and sense organs: Secondary | ICD-10-CM | POA: Insufficient documentation

## 2013-10-07 DIAGNOSIS — O26899 Other specified pregnancy related conditions, unspecified trimester: Secondary | ICD-10-CM | POA: Insufficient documentation

## 2013-10-07 DIAGNOSIS — Z87440 Personal history of urinary (tract) infections: Secondary | ICD-10-CM | POA: Insufficient documentation

## 2013-10-07 LAB — CBC WITH DIFFERENTIAL/PLATELET
BASOS PCT: 0 % (ref 0–1)
Basophils Absolute: 0 10*3/uL (ref 0.0–0.1)
EOS ABS: 0.1 10*3/uL (ref 0.0–0.7)
Eosinophils Relative: 1 % (ref 0–5)
HCT: 31.7 % — ABNORMAL LOW (ref 36.0–46.0)
HEMOGLOBIN: 9.5 g/dL — AB (ref 12.0–15.0)
LYMPHS PCT: 30 % (ref 12–46)
Lymphs Abs: 2.6 10*3/uL (ref 0.7–4.0)
MCH: 20.1 pg — ABNORMAL LOW (ref 26.0–34.0)
MCHC: 30 g/dL (ref 30.0–36.0)
MCV: 67 fL — ABNORMAL LOW (ref 78.0–100.0)
MONOS PCT: 7 % (ref 3–12)
Monocytes Absolute: 0.6 10*3/uL (ref 0.1–1.0)
NEUTROS PCT: 62 % (ref 43–77)
Neutro Abs: 5.3 10*3/uL (ref 1.7–7.7)
Platelets: 377 10*3/uL (ref 150–400)
RBC: 4.73 MIL/uL (ref 3.87–5.11)
RDW: 18.5 % — ABNORMAL HIGH (ref 11.5–15.5)
WBC: 8.6 10*3/uL (ref 4.0–10.5)

## 2013-10-07 MED ORDER — MISOPROSTOL 200 MCG PO TABS: 400.0000 ug | ORAL_TABLET | Freq: Once | ORAL | Status: DC

## 2013-10-07 NOTE — ED Notes (Signed)
C/o vaginal bleeding since 2pm today-c/o feeling weak-csection 4/20

## 2013-10-07 NOTE — Discharge Instructions (Signed)
Cytotec intravaginally as prescribed.  If you continue to bleed or experience further problems, followup with your OB/GYN or [facility].

## 2013-10-07 NOTE — ED Provider Notes (Signed)
CSN: [id number]     Arrival date & time 10/07/13  1901 History  This chart was scribed for Elizabeth Lyons, MD by Joaquin Music, ED Scribe. This patient was seen in room MH05/MH05 and the patient's care was started at 7:23 PM.   Chief Complaint  Patient presents with  . Vaginal Bleeding   Patient is a 23 y.o. female presenting with vaginal bleeding. The history is provided by the patient. No language interpreter was used.  Vaginal Bleeding Quality:  Bright red and clots Severity:  Moderate Onset quality:  Sudden Duration:  5 hours Timing:  Constant Progression:  Unchanged Chronicity:  New Menstrual history:  Irregular Possible pregnancy: no   Context comment:  After physical activity Relieved by:  Nothing Worsened by:  Nothing tried Ineffective treatments:  None tried Associated symptoms: abdominal pain   Associated symptoms: no dizziness   Abdominal pain:    Location:  Suprapubic   Quality:  Aching   Severity:  Mild   Onset quality:  Sudden   Timing:  Intermittent   Progression:  Unchanged   Chronicity:  New Risk factors comment:  C-section 09/27/2013  HPI Comments: Elizabeth Hooper is a 23 y.o. female who presents to the Emergency Department complaining of vaginal bleeding with associated weakness that began 5 hours ago today. She reports having a C-section 09/27/2013. She states she was informed to do exercises do to swelling to lower extremities and feet. She states she was doing little exercises. Pt states her c-section procedure went well, reports having bleeding about 4 days but subsided on its own, and states the bleeding suddenly began today. She states the blood was an excessive amount of bright red blood. Pt states the incision feels normal but reports intermittent suprapubic pain. States she is otherwise healthy. Denies dizziness and currently .  Past Medical History  Diagnosis Date  . Muscular dystrophy   . Pregnant   . UTI (urinary tract infection) during  pregnancy    Past Surgical History  Procedure Laterality Date  . Cesarean section    . Cesarean section N/A 09/27/2013    Procedure: CESAREAN SECTION WITH BILATERAL TUBAL LIGATION;  Surgeon: Lesly Dukes, MD;  Location: [facility];  Service: Obstetrics;  Laterality: N/A;   Family History  Problem Relation Age of Onset  . Muscular dystrophy Father   . Hypertension Father    History  Substance Use Topics  . Smoking status: Never Smoker   . Smokeless tobacco: Never Used  . Alcohol Use: No   OB History   Grav Para Term Preterm Abortions TAB SAB Ect Mult Living   2 2 2  0      2     Review of Systems  Gastrointestinal: Positive for abdominal pain.  Genitourinary: Positive for vaginal bleeding.  Neurological: Positive for weakness. Negative for dizziness.  All other systems reviewed and are negative.  Allergies  Review of patient's allergies indicates no known allergies.  Home Medications   Prior to Admission medications   Medication Sig Start Date End Date Taking? Authorizing Provider  docusate sodium (COLACE) 100 MG capsule Take 1 capsule (100 mg total) by mouth 2 (two) times daily. 09/30/13   Myriam Jacobson, MD  ibuprofen (ADVIL,MOTRIN) 600 MG tablet Take 1 tablet (600 mg total) by mouth every 6 (six) hours. 09/30/13   Wilmer Floor Leftwich-Kirby, CNM  oxyCODONE-acetaminophen (PERCOCET/ROXICET) 5-325 MG per tablet Take 1-2 tablets by mouth every 4 (four) hours as needed for severe pain (moderate - severe  pain). 09/30/13   Myriam Jacobsonobyn H Restrepo, MD  Prenatal Vit-Fe Fumarate-FA (PRENATAL MULTIVITAMIN) TABS tablet Take 1 tablet by mouth daily at 12 noon.    Historical Provider, MD   BP 119/67  Pulse 69  Temp(Src) 99.3 F (37.4 C) (Oral)  Resp 20  Ht 5\' 1"  (1.549 m)  Wt 168 lb (76.204 kg)  BMI 31.76 kg/m2  SpO2 100%  LMP 12/23/2012  Physical Exam  Nursing note and vitals reviewed. Constitutional: She is oriented to person, place, and time. She appears well-developed and  well-nourished. No distress.  HENT:  Head: Normocephalic and atraumatic.  Eyes: EOM are normal.  Neck: Neck supple. No tracheal deviation present.  Cardiovascular: Normal rate.   Pulmonary/Chest: Effort normal. No respiratory distress.  Abdominal: Soft. There is tenderness.  Mild tenderness to palpation in the suprapubic region. The incision appears to be healing well. There is no redness or drainage noted.  Musculoskeletal: Normal range of motion.  Neurological: She is alert and oriented to person, place, and time.  Skin: Skin is warm and dry.  Psychiatric: She has a normal mood and affect. Her behavior is normal.   ED Course  Procedures DIAGNOSTIC STUDIES: Oxygen Saturation is 100% on RA, normal by my interpretation.    COORDINATION OF CARE: 7:29 PM-Discussed treatment plan which includes CBC and ultrasound of abdomen. Pt agreed to plan.   Labs Review Labs Reviewed  CBC WITH DIFFERENTIAL - Abnormal; Notable for the following:    Hemoglobin 9.5 (*)    HCT 31.7 (*)    MCV 67.0 (*)    MCH 20.1 (*)    RDW 18.5 (*)    All other components within normal limits    Imaging Review [place] Transvaginal Non-ob  10/07/2013   CLINICAL DATA:  Vaginal bleeding 10 days post C-section.  EXAM: TRANSABDOMINAL AND TRANSVAGINAL ULTRASOUND OF PELVIS  TECHNIQUE: Both transabdominal and transvaginal ultrasound examinations of the pelvis were performed. Transabdominal technique was performed for global imaging of the pelvis including uterus, ovaries, adnexal regions, and pelvic cul-de-sac. It was necessary to proceed with endovaginal exam following the transabdominal exam to visualize the endometrium and ovaries.  COMPARISON:  None  FINDINGS: Uterus  Measurements: 11.2 x 8.2 x 9.1 cm. No fibroids or other mass visualized.  Endometrium  Thickness: Thickened measuring at least 8 mm in diameter. The endometrial cavity is irregular and demonstrates heterogeneous echoes.  Right ovary  Obscured by bowel gas.  Left  ovary  Obscured by bowel gas.  Other findings  There is a trace of free fluid within the pelvis.  IMPRESSION: 1. There is an abnormal appearance of the endometrial cavity. It appears thickened and irregular in contour and heterogeneous in echotexture. This may reflect retained products of conception. No gas is demonstrated. 2. The adnexal structures were obscured by bowel gas.   Electronically Signed   By: David  [place]   On: 10/07/2013 21:08   [place] Pelvis Complete  10/07/2013   CLINICAL DATA:  Vaginal bleeding 10 days post C-section.  EXAM: TRANSABDOMINAL AND TRANSVAGINAL ULTRASOUND OF PELVIS  TECHNIQUE: Both transabdominal and transvaginal ultrasound examinations of the pelvis were performed. Transabdominal technique was performed for global imaging of the pelvis including uterus, ovaries, adnexal regions, and pelvic cul-de-sac. It was necessary to proceed with endovaginal exam following the transabdominal exam to visualize the endometrium and ovaries.  COMPARISON:  None  FINDINGS: Uterus  Measurements: 11.2 x 8.2 x 9.1 cm. No fibroids or other mass visualized.  Endometrium  Thickness: Thickened measuring  at least 8 mm in diameter. The endometrial cavity is irregular and demonstrates heterogeneous echoes.  Right ovary  Obscured by bowel gas.  Left ovary  Obscured by bowel gas.  Other findings  There is a trace of free fluid within the pelvis.  IMPRESSION: 1. There is an abnormal appearance of the endometrial cavity. It appears thickened and irregular in contour and heterogeneous in echotexture. This may reflect retained products of conception. No gas is demonstrated. 2. The adnexal structures were obscured by bowel gas.   Electronically Signed   By: David  [place]   On: 10/07/2013 21:08     EKG Interpretation None     MDM   Final diagnoses:  None    Patient presents here with vaginal bleeding several days status post cesarean section. Ultrasound was suggestive of possible retained products of  conception, however she is afebrile and has no white count and her hemoglobin is stable. I[facility] who [facility] recommending Cytotec intravaginally and followup with OB in the near future.  I personally performed the services described in this documentation, which was scribed in my presence. The recorded information has been reviewed and is accurate.      Elizabeth Lyonsouglas Gladys Deckard, MD 10/07/13 2142

## 2013-11-02 ENCOUNTER — Telehealth: Payer: Self-pay | Admitting: *Deleted

## 2013-11-02 ENCOUNTER — Ambulatory Visit: Payer: Medicaid Other | Admitting: Family Medicine

## 2013-11-02 NOTE — Telephone Encounter (Signed)
Called patient and informed of missed appointment, pt forgot appointment and will reschedule.

## 2013-11-15 ENCOUNTER — Encounter: Payer: Self-pay | Admitting: *Deleted

## 2014-01-27 ENCOUNTER — Encounter (HOSPITAL_BASED_OUTPATIENT_CLINIC_OR_DEPARTMENT_OTHER): Payer: Self-pay | Admitting: Emergency Medicine

## 2014-01-27 ENCOUNTER — Emergency Department (HOSPITAL_BASED_OUTPATIENT_CLINIC_OR_DEPARTMENT_OTHER): Payer: Medicaid Other

## 2014-01-27 ENCOUNTER — Emergency Department (HOSPITAL_BASED_OUTPATIENT_CLINIC_OR_DEPARTMENT_OTHER)
Admission: EM | Admit: 2014-01-27 | Discharge: 2014-01-27 | Disposition: A | Discharge status: Home / Self Care | Payer: Medicaid Other | Attending: Emergency Medicine | Admitting: Emergency Medicine

## 2014-01-27 DIAGNOSIS — Z87440 Personal history of urinary (tract) infections: Secondary | ICD-10-CM | POA: Diagnosis not present

## 2014-01-27 DIAGNOSIS — R51 Headache: Secondary | ICD-10-CM | POA: Diagnosis present

## 2014-01-27 DIAGNOSIS — Z8669 Personal history of other diseases of the nervous system and sense organs: Secondary | ICD-10-CM | POA: Diagnosis not present

## 2014-01-27 DIAGNOSIS — R519 Headache, unspecified: Secondary | ICD-10-CM

## 2014-01-27 DIAGNOSIS — Z79899 Other long term (current) drug therapy: Secondary | ICD-10-CM | POA: Insufficient documentation

## 2014-01-27 MED ORDER — KETOROLAC TROMETHAMINE 60 MG/2ML IM SOLN
60.0000 mg | Freq: Once | INTRAMUSCULAR | Status: AC
  Administered 2014-01-27: 60 mg via INTRAMUSCULAR
  Filled 2014-01-27: qty 2

## 2014-01-27 NOTE — ED Notes (Signed)
Pt c/o right side h/a and facial pain/ pressure x 1 week

## 2014-01-27 NOTE — Discharge Instructions (Signed)

## 2014-01-27 NOTE — ED Provider Notes (Signed)
Medical screening examination/treatment/procedure(s) were performed by non-physician practitioner and as supervising physician I was immediately available for consultation/collaboration.   EKG Interpretation None        Jerik Falletta H Nysir Fergusson, MD 01/27/14 2232 

## 2014-01-27 NOTE — ED Provider Notes (Signed)
CSN: [id number]     Arrival date & time 01/27/14  1832 History   First MD Initiated Contact with Patient 01/27/14 1839     Chief Complaint  Patient presents with  . Facial Pain     (Consider location/radiation/quality/duration/timing/severity/associated sxs/prior Treatment) HPI Comments: Pt states that she has had right sided headache and facial for the the last week. Pt states that she was seen by her pcp and given antibiotics, nasal spray and fioricet and the symptoms are not getting any better. Denies fever, blurred vision, or neck pain.  The history is provided by the patient. No language interpreter was used.    Past Medical History  Diagnosis Date  . Muscular dystrophy   . Pregnant   . UTI (urinary tract infection) during pregnancy    Past Surgical History  Procedure Laterality Date  . Cesarean section    . Cesarean section N/A 09/27/2013    Procedure: CESAREAN SECTION WITH BILATERAL TUBAL LIGATION;  Surgeon: Lesly Dukes, MD;  Location: [facility];  Service: Obstetrics;  Laterality: N/A;   Family History  Problem Relation Age of Onset  . Muscular dystrophy Father   . Hypertension Father    History  Substance Use Topics  . Smoking status: Never Smoker   . Smokeless tobacco: Never Used  . Alcohol Use: No   OB History   Grav Para Term Preterm Abortions TAB SAB Ect Mult Living   2 2 2  0      2     Review of Systems  Constitutional: Negative.   Respiratory: Negative.   Cardiovascular: Negative.       Allergies  Review of patient's allergies indicates no known allergies.  Home Medications   Prior to Admission medications   Medication Sig Start Date End Date Taking? Authorizing Provider  docusate sodium (COLACE) 100 MG capsule Take 1 capsule (100 mg total) by mouth 2 (two) times daily. 09/30/13   Myriam Jacobson, MD  ibuprofen (ADVIL,MOTRIN) 600 MG tablet Take 1 tablet (600 mg total) by mouth every 6 (six) hours. 09/30/13   Wilmer Floor Leftwich-Kirby, CNM   misoprostol (CYTOTEC) 200 MCG tablet Place 2 tablets (400 mcg total) vaginally once. 10/07/13   Geoffery Lyons, MD  oxyCODONE-acetaminophen (PERCOCET/ROXICET) 5-325 MG per tablet Take 1-2 tablets by mouth every 4 (four) hours as needed for severe pain (moderate - severe pain). 09/30/13   Myriam Jacobson, MD  Prenatal Vit-Fe Fumarate-FA (PRENATAL MULTIVITAMIN) TABS tablet Take 1 tablet by mouth daily at 12 noon.    Historical Provider, MD   BP 133/76  Pulse 88  Temp(Src) 99 F (37.2 C) (Oral)  Resp 18  Ht 5\' 1"  (1.549 m)  Wt 144 lb (65.318 kg)  BMI 27.22 kg/m2  SpO2 99%  LMP 01/05/2014 Physical Exam  Nursing note and vitals reviewed. Constitutional: She is oriented to person, place, and time. She appears well-developed and well-nourished.  HENT:  Head: Normocephalic and atraumatic.  Right Ear: External ear normal.  Left Ear: External ear normal.  Eyes: Conjunctivae and EOM are normal. Pupils are equal, round, and reactive to light.  Cardiovascular: Normal rate and regular rhythm.   Pulmonary/Chest: Effort normal and breath sounds normal.  Musculoskeletal: Normal range of motion.  Neurological: She is alert and oriented to person, place, and time.  Skin: Skin is warm.  Psychiatric: She has a normal mood and affect.    ED Course  Procedures (including critical care time) Labs Review Labs Reviewed - No data to display  Imaging Review Ct Head Wo Contrast  01/27/2014   CLINICAL DATA:  Headache.  Facial pain.  EXAM: CT HEAD WITHOUT CONTRAST  TECHNIQUE: Contiguous axial images were obtained from the base of the skull through the vertex without intravenous contrast.  COMPARISON:  None.  FINDINGS: No FX or extra-axial pathologic fluid or blood collection. No mass. No hydrocephalus. Benign dural calcification. No acute bony abnormality. Calvaria is intact. Visualized paranasal sinuses and mastoids are clear.  IMPRESSION: No acute abnormality.   Electronically Signed   By: Maisie Fushomas  Register    On: 01/27/2014 20:44     EKG Interpretation None      MDM   Final diagnoses:  Headache, unspecified headache type    Pt is neurologically intact. Pt feeling better after toradol. Pt okay with plan to go home and follow up with pcp as needed    Teressa LowerVrinda Clay Solum, NP 01/27/14 2141

## 2014-03-01 ENCOUNTER — Encounter (HOSPITAL_BASED_OUTPATIENT_CLINIC_OR_DEPARTMENT_OTHER): Payer: Self-pay | Admitting: Emergency Medicine

## 2014-03-01 ENCOUNTER — Emergency Department (HOSPITAL_BASED_OUTPATIENT_CLINIC_OR_DEPARTMENT_OTHER)
Admission: EM | Admit: 2014-03-01 | Discharge: 2014-03-01 | Disposition: A | Discharge status: Home / Self Care | Payer: Medicaid Other | Attending: Emergency Medicine | Admitting: Emergency Medicine

## 2014-03-01 DIAGNOSIS — J3489 Other specified disorders of nose and nasal sinuses: Secondary | ICD-10-CM | POA: Diagnosis present

## 2014-03-01 DIAGNOSIS — J018 Other acute sinusitis: Secondary | ICD-10-CM | POA: Insufficient documentation

## 2014-03-01 DIAGNOSIS — Z79899 Other long term (current) drug therapy: Secondary | ICD-10-CM | POA: Insufficient documentation

## 2014-03-01 DIAGNOSIS — Z87440 Personal history of urinary (tract) infections: Secondary | ICD-10-CM | POA: Diagnosis not present

## 2014-03-01 DIAGNOSIS — H748X9 Other specified disorders of middle ear and mastoid, unspecified ear: Secondary | ICD-10-CM | POA: Diagnosis not present

## 2014-03-01 DIAGNOSIS — J0140 Acute pansinusitis, unspecified: Secondary | ICD-10-CM

## 2014-03-01 DIAGNOSIS — Z792 Long term (current) use of antibiotics: Secondary | ICD-10-CM | POA: Insufficient documentation

## 2014-03-01 NOTE — Discharge Instructions (Signed)
You can take an antihistamine such as claritin, zyrtec, or allegra, as well as a decongestant such as pseudoephedrine Sinusitis Sinusitis is redness, soreness, and inflammation of the paranasal sinuses. Paranasal sinuses are air pockets within the bones of your face (beneath the eyes, the middle of the forehead, or above the eyes). In healthy paranasal sinuses, mucus is able to drain out, and air is able to circulate through them by way of your nose. However, when your paranasal sinuses are inflamed, mucus and air can become trapped. This can allow bacteria and other germs to grow and cause infection. Sinusitis can develop quickly and last only a short time (acute) or continue over a long period (chronic). Sinusitis that lasts for more than 12 weeks is considered chronic.  CAUSES  Causes of sinusitis include:  Allergies.  Structural abnormalities, such as displacement of the cartilage that separates your nostrils (deviated septum), which can decrease the air flow through your nose and sinuses and affect sinus drainage.  Functional abnormalities, such as when the small hairs (cilia) that line your sinuses and help remove mucus do not work properly or are not present. SIGNS AND SYMPTOMS  Symptoms of acute and chronic sinusitis are the same. The primary symptoms are pain and pressure around the affected sinuses. Other symptoms include:  Upper toothache.  Earache.  Headache.  Bad breath.  Decreased sense of smell and taste.  A cough, which worsens when you are lying flat.  Fatigue.  Fever.  Thick drainage from your nose, which often is green and may contain pus (purulent).  Swelling and warmth over the affected sinuses. DIAGNOSIS  Your health care provider will perform a physical exam. During the exam, your health care provider may:  Look in your nose for signs of abnormal growths in your nostrils (nasal polyps).  Tap over the affected sinus to check for signs of infection.  View  the inside of your sinuses (endoscopy) using an imaging device that has a light attached (endoscope). If your health care provider suspects that you have chronic sinusitis, one or more of the following tests may be recommended:  Allergy tests.  Nasal culture. A sample of mucus is taken from your nose, sent to a lab, and screened for bacteria.  Nasal cytology. A sample of mucus is taken from your nose and examined by your health care provider to determine if your sinusitis is related to an allergy. TREATMENT  Most cases of acute sinusitis are related to a viral infection and will resolve on their own within 10 days. Sometimes medicines are prescribed to help relieve symptoms (pain medicine, decongestants, nasal steroid sprays, or saline sprays).  However, for sinusitis related to a bacterial infection, your health care provider will prescribe antibiotic medicines. These are medicines that will help kill the bacteria causing the infection.  Rarely, sinusitis is caused by a fungal infection. In theses cases, your health care provider will prescribe antifungal medicine. For some cases of chronic sinusitis, surgery is needed. Generally, these are cases in which sinusitis recurs more than 3 times per year, despite other treatments. HOME CARE INSTRUCTIONS   Drink plenty of water. Water helps thin the mucus so your sinuses can drain more easily.  Use a humidifier.  Inhale steam 3 to 4 times a day (for example, sit in the bathroom with the shower running).  Apply a warm, moist washcloth to your face 3 to 4 times a day, or as directed by your health care provider.  Use saline nasal sprays  to help moisten and clean your sinuses.  Take medicines only as directed by your health care provider.  If you were prescribed either an antibiotic or antifungal medicine, finish it all even if you start to feel better. SEEK IMMEDIATE MEDICAL CARE IF:  You have increasing pain or severe headaches.  You have  nausea, vomiting, or drowsiness.  You have swelling around your face.  You have vision problems.  You have a stiff neck.  You have difficulty breathing. MAKE SURE YOU:   Understand these instructions.  Will watch your condition.  Will get help right away if you are not doing well or get worse. Document Released: 05/27/2005 Document Revised: 10/11/2013 Document Reviewed: 06/11/2011 [facility] Patient Information 2015 [place], [place]. This information is not intended to replace advice given to you by your health care provider. Make sure you discuss any questions you have with your health care provider.

## 2014-03-01 NOTE — ED Notes (Signed)
Pt reports that she was treated for a sinus infection 1 -2 weeks ago, who now presents with bilateral  Edematous eurhythmia areas noted to bilateral nares, pt denies drainage, + pain

## 2014-03-01 NOTE — ED Provider Notes (Signed)
CSN: [id number]     Arrival date & time 03/01/14  2023 History  This chart was scribed for Elizabeth Mo, MD by Roxy Cedar, ED Scribe. This patient was seen in room MHFT2/MHFT2 and the patient's care was started at 10:56 PM.  Chief Complaint  Patient presents with  . Nose Problem   HPI  HPI Comments: Elizabeth Hooper is a 23 y.o. female with a history of a recent sinus infection currently on amoxicillin, who presents to the Emergency Department complaining of sinus congestion that began 2 days ago. Patient reports associated drainage in sinuses but states that "nothing is coming out" and also reports associated ear pain and a mild cough. Patient denies associated fever, nausea or vomiting. Patient states that she started amoxicillin on 02/22/14 for a 21 day course that was prescribed to the patient for a sinus infection. Nothing makes her symptoms better or worse.  Timing of symptoms is constant.  Past Medical History  Diagnosis Date  . Muscular dystrophy   . Pregnant   . UTI (urinary tract infection) during pregnancy    Past Surgical History  Procedure Laterality Date  . Cesarean section    . Cesarean section N/A 09/27/2013    Procedure: CESAREAN SECTION WITH BILATERAL TUBAL LIGATION;  Surgeon: Lesly Dukes, MD;  Location: [facility];  Service: Obstetrics;  Laterality: N/A;   Family History  Problem Relation Age of Onset  . Muscular dystrophy Father   . Hypertension Father    History  Substance Use Topics  . Smoking status: Never Smoker   . Smokeless tobacco: Never Used  . Alcohol Use: No   OB History   Grav Para Term Preterm Abortions TAB SAB Ect Mult Living   0      2     Review of Systems  Constitutional: Negative for fever and chills.  HENT: Negative for congestion, rhinorrhea and sore throat.   Eyes: Negative for photophobia and visual disturbance.  Respiratory: Negative for cough and shortness of breath.   Cardiovascular: Negative for chest pain and leg  swelling.  Gastrointestinal: Negative for nausea, vomiting, abdominal pain, diarrhea and constipation.  Endocrine: Negative for polyphagia and polyuria.  Genitourinary: Negative for dysuria, flank pain, vaginal bleeding, vaginal discharge and enuresis.  Musculoskeletal: Negative for back pain and gait problem.  Skin: Negative for color change and rash.  Neurological: Negative for dizziness, syncope, light-headedness and numbness.  Hematological: Negative for adenopathy. Does not bruise/bleed easily.  All other systems reviewed and are negative.  Allergies  Review of patient's allergies indicates no known allergies.  Home Medications   Prior to Admission medications   Medication Sig Start Date End Date Taking? Authorizing Provider  amoxicillin-clavulanate (AUGMENTIN) 500-125 MG per tablet Take 1 tablet by mouth 3 (three) times daily.   Yes Historical Provider, MD  docusate sodium (COLACE) 100 MG capsule Take 1 capsule (100 mg total) by mouth 2 (two) times daily. 09/30/13   Myriam Jacobson, MD  ibuprofen (ADVIL,MOTRIN) 600 MG tablet Take 1 tablet (600 mg total) by mouth every 6 (six) hours. 09/30/13   Wilmer Floor Leftwich-Kirby, CNM  misoprostol (CYTOTEC) 200 MCG tablet Place 2 tablets (400 mcg total) vaginally once. 10/07/13   Geoffery Lyons, MD  oxyCODONE-acetaminophen (PERCOCET/ROXICET) 5-325 MG per tablet Take 1-2 tablets by mouth every 4 (four) hours as needed for severe pain (moderate - severe pain). 09/30/13   Myriam Jacobson, MD  Prenatal Vit-Fe Fumarate-FA (PRENATAL MULTIVITAMIN) TABS tablet Take 1 tablet  by mouth daily at 12 noon.    Historical Provider, MD   Triage Vitals: BP 134/71  Pulse 76  Temp(Src) 98.9 F (37.2 C) (Oral)  Resp 16  Ht  (1.549 m)  Wt 137 lb (62.143 kg)  BMI 25.90 kg/m2  SpO2 100%  LMP 02/04/2014  Breastfeeding? No  Physical Exam  Vitals reviewed. Constitutional: She is oriented to person, place, and time. She appears well-developed and well-nourished.   HENT:  Head: Normocephalic and atraumatic.  Right Ear: External ear normal.  Left Ear: External ear normal.  Boggy nasal turbinates bilaterally, bil mid ear effusions without erythema.    Eyes: Conjunctivae and EOM are normal. Pupils are equal, round, and reactive to light.  Neck: Normal range of motion. Neck supple.  Cardiovascular: Normal rate, regular rhythm, normal heart sounds and intact distal pulses.   Pulmonary/Chest: Effort normal and breath sounds normal.  Abdominal: Soft. Bowel sounds are normal. There is no tenderness.  Musculoskeletal: Normal range of motion.  Neurological: She is alert and oriented to person, place, and time.  Skin: Skin is warm and dry.    ED Course  Procedures (including critical care time)  DIAGNOSTIC STUDIES:     COORDINATION OF CARE: 11:01 PM- Advised patient to take an antihistamine. Pt advised of plan for treatment and pt agrees.  Labs Review Labs Reviewed - No data to display  Imaging Review No results found.   EKG Interpretation None     MDM   Final diagnoses:  None    23 y.o. female with pertinent PMH of recent sinusitis on amoxicillin currently presents with concern for right nasal congestion and sinus tenderness. The patient had similar symptoms were such prescribing metabolic. Patient states symptoms worsen acutely 2 days ago. She denies recent fever, has had no by mouth intolerance or other signs of systemic illness. Physical exam as above with boggy nasal turbinates bilaterally, however no acute signs of acute otitis media, bacterial sinusitis, or other emergent pathology. The patient has no nuchal rigidity or other signs of meningitis. She appears well. The patient was informed to take an antihistamine and a nasal decongestant to help with her symptoms. The patient voiced understanding of precautions, agreed to followup. Discharged in stable condition.    1. Acute pansinusitis, recurrence not specified       I personally  performed the services described in this documentation, which was scribed in my presence. The recorded information has been reviewed and is accurate.  Elizabeth Mo, MD 03/02/14 [phone]

## 2014-03-01 NOTE — ED Notes (Signed)
MD at bedside. 

## 2014-03-01 NOTE — ED Notes (Signed)
Pt reports she feels like there's something in her nose and has been evaluated by her doctor who did a sinus xray- states she is supposed to f/u with ENT but appointment is not for another 1.5 weeks

## 2014-04-11 ENCOUNTER — Encounter (HOSPITAL_BASED_OUTPATIENT_CLINIC_OR_DEPARTMENT_OTHER): Payer: Self-pay | Admitting: Emergency Medicine

## 2014-04-25 ENCOUNTER — Encounter (HOSPITAL_BASED_OUTPATIENT_CLINIC_OR_DEPARTMENT_OTHER): Payer: Self-pay | Admitting: *Deleted

## 2014-04-25 ENCOUNTER — Emergency Department (HOSPITAL_BASED_OUTPATIENT_CLINIC_OR_DEPARTMENT_OTHER)
Admission: EM | Admit: 2014-04-25 | Discharge: 2014-04-25 | Disposition: A | Discharge status: Home / Self Care | Payer: Medicaid Other | Attending: Emergency Medicine | Admitting: Emergency Medicine

## 2014-04-25 ENCOUNTER — Emergency Department (HOSPITAL_BASED_OUTPATIENT_CLINIC_OR_DEPARTMENT_OTHER): Payer: Medicaid Other

## 2014-04-25 DIAGNOSIS — K5900 Constipation, unspecified: Secondary | ICD-10-CM | POA: Insufficient documentation

## 2014-04-25 DIAGNOSIS — Z792 Long term (current) use of antibiotics: Secondary | ICD-10-CM | POA: Diagnosis not present

## 2014-04-25 DIAGNOSIS — Z9889 Other specified postprocedural states: Secondary | ICD-10-CM | POA: Insufficient documentation

## 2014-04-25 DIAGNOSIS — Z87440 Personal history of urinary (tract) infections: Secondary | ICD-10-CM | POA: Diagnosis not present

## 2014-04-25 DIAGNOSIS — Z79899 Other long term (current) drug therapy: Secondary | ICD-10-CM | POA: Insufficient documentation

## 2014-04-25 DIAGNOSIS — Z8669 Personal history of other diseases of the nervous system and sense organs: Secondary | ICD-10-CM | POA: Diagnosis not present

## 2014-04-25 MED ORDER — FLEET ENEMA 7-19 GM/118ML RE ENEM
1.0000 | ENEMA | Freq: Once | RECTAL | Status: AC
  Administered 2014-04-25: 1 via RECTAL
  Filled 2014-04-25: qty 1

## 2014-04-25 MED ORDER — OMEPRAZOLE 20 MG PO CPDR: 20.0000 mg | DELAYED_RELEASE_CAPSULE | Freq: Every day | ORAL | Status: DC

## 2014-04-25 MED ORDER — POLYETHYLENE GLYCOL 3350 17 GM/SCOOP PO POWD: 17.0000 g | Freq: Every day | ORAL | Status: DC

## 2014-04-25 NOTE — ED Notes (Signed)
Pt constipation x 1 week . Last BM x 1 week ago. PTA mag citrate , colace w/o relief

## 2014-04-25 NOTE — Discharge Instructions (Signed)
Stop taking TUMS.  TUMS will make you constipated.  Take Omeprazole instead for heartburn.  Take Miralax for constipation until your bowel movements have become regular again.  Do not take Miralax if you have diarrhea.

## 2014-04-25 NOTE — ED Provider Notes (Signed)
CSN: [id number]     Arrival date & time 04/25/14  1806 History   First MD Initiated Contact with Patient 04/25/14 1858     Chief Complaint  Patient presents with  . Constipation     (Consider location/radiation/quality/duration/timing/severity/associated sxs/prior Treatment) HPI Comments: Patient presents today with constipation.  She reports that her last BM was one week ago and was normal at that time.  She reports that she is currently having generalized cramping abdominal pain.  She reports that she has taken Magnesium Citrate and Colace without relief.  She denies nausea, vomiting, urinary symptoms, fever, or chills.  She does report that she has been taking six TUMS a day over the past week for heartburn.  She has had a previous C-Section, but no other abdominal surgeries.    Patient is a 23 y.o. female presenting with constipation. The history is provided by the patient.  Constipation   Past Medical History  Diagnosis Date  . Muscular dystrophy   . Pregnant   . UTI (urinary tract infection) during pregnancy    Past Surgical History  Procedure Laterality Date  . Cesarean section    . Cesarean section N/A 09/27/2013    Procedure: CESAREAN SECTION WITH BILATERAL TUBAL LIGATION;  Surgeon: Lesly DukesKelly H Leggett, MD;  Location: [facility];  Service: Obstetrics;  Laterality: N/A;  . Tubal ligation     Family History  Problem Relation Age of Onset  . Muscular dystrophy Father   . Hypertension Father    History  Substance Use Topics  . Smoking status: Never Smoker   . Smokeless tobacco: Never Used  . Alcohol Use: No   OB History    Gravida Para Term Preterm AB TAB SAB Ectopic Multiple Living   2 2 2  0      2     Review of Systems  Gastrointestinal: Positive for constipation.  All other systems reviewed and are negative.     Allergies  Review of patient's allergies indicates no known allergies.  Home Medications   Prior to Admission medications   Medication Sig Start Date  End Date Taking? Authorizing Provider  amoxicillin-clavulanate (AUGMENTIN) 500-125 MG per tablet Take 1 tablet by mouth 3 (three) times daily.    Historical Provider, MD  docusate sodium (COLACE) 100 MG capsule Take 1 capsule (100 mg total) by mouth 2 (two) times daily. 09/30/13   Myriam Jacobsonobyn H Restrepo, MD  ibuprofen (ADVIL,MOTRIN) 600 MG tablet Take 1 tablet (600 mg total) by mouth every 6 (six) hours. 09/30/13   Wilmer FloorLisa A Leftwich-Kirby, CNM  misoprostol (CYTOTEC) 200 MCG tablet Place 2 tablets (400 mcg total) vaginally once. 10/07/13   Geoffery Lyonsouglas Delo, MD  oxyCODONE-acetaminophen (PERCOCET/ROXICET) 5-325 MG per tablet Take 1-2 tablets by mouth every 4 (four) hours as needed for severe pain (moderate - severe pain). 09/30/13   Myriam Jacobsonobyn H Restrepo, MD  Prenatal Vit-Fe Fumarate-FA (PRENATAL MULTIVITAMIN) TABS tablet Take 1 tablet by mouth daily at 12 noon.    Historical Provider, MD   BP 112/77 mmHg  Pulse 92  Temp(Src) 97.8 F (36.6 C) (Oral)  Resp 16  Ht 5\' 1"  (1.549 m)  Wt 133 lb (60.328 kg)  BMI 25.14 kg/m2  SpO2 100%  LMP 04/06/2014 Physical Exam  Constitutional: She appears well-developed and well-nourished.  HENT:  Head: Normocephalic and atraumatic.  Mouth/Throat: Oropharynx is clear and moist.  Neck: Normal range of motion. Neck supple.  Cardiovascular: Normal rate, regular rhythm and normal heart sounds.   Pulmonary/Chest: Effort normal and  breath sounds normal.  Abdominal: Soft. Bowel sounds are normal. She exhibits no distension and no mass. There is no tenderness. There is no rebound and no guarding.  Genitourinary: Rectal exam shows no external hemorrhoid and no fissure.  No fecal impaction palpated with rectal exam  Neurological: She is alert.  Skin: Skin is warm and dry.  Psychiatric: She has a normal mood and affect.  Nursing note and vitals reviewed.   ED Course  Procedures (including critical care time) Labs Review Labs Reviewed - No data to display  Imaging Review Dg Abd 1  View  04/25/2014   CLINICAL DATA:  Constipation for 1 week. Generalized abdominal pain. Initial encounter.  EXAM: ABDOMEN - 1 VIEW  COMPARISON:  None.  FINDINGS: There is a moderate amount of stool within the rectum. No other significantly increased stool burden identified. The bowel gas pattern is nonobstructive. There is no bowel wall thickening or supine evidence of free intraperitoneal air. There are no suspicious abdominal calcifications. Bilateral tubal ligation clips are noted. The osseous structures appear normal.  IMPRESSION: Moderately increased stool within the rectum. No evidence of bowel obstruction.   Electronically Signed   By: Roxy HorsemanBill  Veazey M.D.   On: 04/25/2014 18:44     EKG Interpretation None     7:54 PM Patient had a large BM after enema. MDM   Final diagnoses:  Constipated   Patient presents today with constipation.  No evidence of bowel obstruction on xray.  Patient given fleet enema in the ED and had a large bowel movement.  Abdominal pain improved after BM.  Patient instructed to stop taking the TUMS because this is most likely causing the constipation.  Patient stable for discharge.  Return precautions given.   Santiago GladHeather Gorman Safi, PA-C 04/26/14 [phone]  Toy CookeyMegan Docherty, MD 04/26/14 1120

## 2014-05-14 ENCOUNTER — Encounter (HOSPITAL_BASED_OUTPATIENT_CLINIC_OR_DEPARTMENT_OTHER): Payer: Self-pay | Admitting: *Deleted

## 2014-05-14 ENCOUNTER — Emergency Department (HOSPITAL_BASED_OUTPATIENT_CLINIC_OR_DEPARTMENT_OTHER)
Admission: EM | Admit: 2014-05-14 | Discharge: 2014-05-14 | Disposition: A | Discharge status: Home / Self Care | Payer: Medicaid Other | Attending: Emergency Medicine | Admitting: Emergency Medicine

## 2014-05-14 DIAGNOSIS — Z792 Long term (current) use of antibiotics: Secondary | ICD-10-CM | POA: Insufficient documentation

## 2014-05-14 DIAGNOSIS — B373 Candidiasis of vulva and vagina: Secondary | ICD-10-CM

## 2014-05-14 DIAGNOSIS — Z8669 Personal history of other diseases of the nervous system and sense organs: Secondary | ICD-10-CM | POA: Diagnosis not present

## 2014-05-14 DIAGNOSIS — Z202 Contact with and (suspected) exposure to infections with a predominantly sexual mode of transmission: Secondary | ICD-10-CM | POA: Diagnosis present

## 2014-05-14 DIAGNOSIS — Z3202 Encounter for pregnancy test, result negative: Secondary | ICD-10-CM | POA: Insufficient documentation

## 2014-05-14 DIAGNOSIS — Z79899 Other long term (current) drug therapy: Secondary | ICD-10-CM | POA: Diagnosis not present

## 2014-05-14 DIAGNOSIS — B3731 Acute candidiasis of vulva and vagina: Secondary | ICD-10-CM

## 2014-05-14 LAB — URINALYSIS, ROUTINE W REFLEX MICROSCOPIC
Bilirubin Urine: NEGATIVE
GLUCOSE, UA: NEGATIVE mg/dL
Hgb urine dipstick: NEGATIVE
Ketones, ur: NEGATIVE mg/dL
NITRITE: NEGATIVE
PH: 7.5 (ref 5.0–8.0)
Protein, ur: NEGATIVE mg/dL
SPECIFIC GRAVITY, URINE: 1.029 (ref 1.005–1.030)
Urobilinogen, UA: 4 mg/dL — ABNORMAL HIGH (ref 0.0–1.0)

## 2014-05-14 LAB — PREGNANCY, URINE: PREG TEST UR: NEGATIVE

## 2014-05-14 LAB — WET PREP, GENITAL: Trich, Wet Prep: NONE SEEN

## 2014-05-14 LAB — RPR

## 2014-05-14 LAB — URINE MICROSCOPIC-ADD ON

## 2014-05-14 LAB — HIV ANTIBODY (ROUTINE TESTING W REFLEX): HIV: NONREACTIVE

## 2014-05-14 MED ORDER — FLUCONAZOLE 50 MG PO TABS
150.0000 mg | ORAL_TABLET | Freq: Once | ORAL | Status: AC
  Administered 2014-05-14: 150 mg via ORAL
  Filled 2014-05-14 (×2): qty 1

## 2014-05-14 NOTE — ED Notes (Signed)
requesting testing for STD- denies sx- concerned she may have been exposed by her partner

## 2014-05-14 NOTE — Discharge Instructions (Signed)
Please read and follow all provided instructions.  Your diagnoses today include:  1. Possible exposure to STD   2. Vaginal candidiasis     Tests performed today include:  Test for gonorrhea, chlamydia, syphilis, and HIV. You will be notified by telephone if you have a positive result.  Wet prep - shows a few yeast  Vital signs. See below for your results today.   Medications:   None   Home care instructions:  Read educational materials contained in this packet and follow any instructions provided.   STD Testing:  [facility] [place] [place] [facility]  [address], [place], phone [phone] or [phone]    Monday - Friday, call for an appointment  [facility] [place] [place] [facility]  [address], [place], phone [phone] or [phone]   Monday - Friday, call for an appointment  Return instructions:   Please return to the Emergency Department if you experience worsening symptoms.   Please return if you have any other emergent concerns.  Additional Information:  Your vital signs today were: BP 120/55 mmHg   Pulse 71   Temp(Src) 98.8 F (37.1 C) (Oral)   Resp 18   Ht 5' (1.524 m)   Wt 136 lb (61.689 kg)   BMI 26.56 kg/m2   SpO2 96%   LMP 05/07/2014   Breastfeeding? No If your blood pressure (BP) was elevated above 135/85 this visit, please have this repeated by your doctor within one month. --------------

## 2014-05-14 NOTE — ED Provider Notes (Signed)
CSN: [id number]     Arrival date & time 05/14/14  1505 History   First MD Initiated Contact with Patient 05/14/14 1610     Chief Complaint  Patient presents with  . Exposure to STD     (Consider location/radiation/quality/duration/timing/severity/associated sxs/prior Treatment) HPI Comments: Patient presents requesting STD check. Patient is concerned that she may have been exposed to an unknown sexual transmitted infection by her partner. She is currently asymptomatic. She denies vaginal bleeding or discharge. Last menstrual period was 05/07/14. No pelvic pain. No fevers, nausea, vomiting, or diarrhea. No dysuria. No treatments prior to arrival.  Patient is a 23 y.o. female presenting with STD exposure. The history is provided by the patient.  Exposure to STD Pertinent negatives include no abdominal pain, arthralgias, chest pain, coughing, fever, headaches, myalgias, nausea, rash, sore throat or vomiting.    Past Medical History  Diagnosis Date  . Muscular dystrophy   . UTI (urinary tract infection) during pregnancy    Past Surgical History  Procedure Laterality Date  . Cesarean section    . Cesarean section N/A 09/27/2013    Procedure: CESAREAN SECTION WITH BILATERAL TUBAL LIGATION;  Surgeon: Lesly DukesKelly H Leggett, MD;  Location: [facility];  Service: Obstetrics;  Laterality: N/A;  . Tubal ligation     Family History  Problem Relation Age of Onset  . Muscular dystrophy Father   . Hypertension Father    History  Substance Use Topics  . Smoking status: Never Smoker   . Smokeless tobacco: Never Used  . Alcohol Use: No   OB History    Gravida Para Term Preterm AB TAB SAB Ectopic Multiple Living   2 2 2  0      2     Review of Systems  Constitutional: Negative for fever.  HENT: Negative for rhinorrhea and sore throat.   Eyes: Negative for discharge and redness.  Respiratory: Negative for cough.   Cardiovascular: Negative for chest pain.  Gastrointestinal: Negative for nausea,  vomiting, abdominal pain, diarrhea and rectal pain.  Genitourinary: Negative for dysuria, frequency, vaginal bleeding, vaginal discharge, genital sores and pelvic pain.  Musculoskeletal: Negative for myalgias and arthralgias.  Skin: Negative for rash.  Neurological: Negative for headaches.  Hematological: Negative for adenopathy.    Allergies  Review of patient's allergies indicates no known allergies.  Home Medications   Prior to Admission medications   Medication Sig Start Date End Date Taking? Authorizing Provider  esomeprazole (NEXIUM) 40 MG capsule Take 40 mg by mouth daily at 12 noon.   Yes Historical Provider, MD  amoxicillin-clavulanate (AUGMENTIN) 500-125 MG per tablet Take 1 tablet by mouth 3 (three) times daily.    Historical Provider, MD  docusate sodium (COLACE) 100 MG capsule Take 1 capsule (100 mg total) by mouth 2 (two) times daily. 09/30/13   Myriam Jacobsonobyn H Restrepo, MD  ibuprofen (ADVIL,MOTRIN) 600 MG tablet Take 1 tablet (600 mg total) by mouth every 6 (six) hours. 09/30/13   Wilmer FloorLisa A Leftwich-Kirby, CNM  misoprostol (CYTOTEC) 200 MCG tablet Place 2 tablets (400 mcg total) vaginally once. 10/07/13   Geoffery Lyonsouglas Delo, MD  omeprazole (PRILOSEC) 20 MG capsule Take 1 capsule (20 mg total) by mouth daily. 04/25/14   Santiago GladHeather Laisure, PA-C  oxyCODONE-acetaminophen (PERCOCET/ROXICET) 5-325 MG per tablet Take 1-2 tablets by mouth every 4 (four) hours as needed for severe pain (moderate - severe pain). 09/30/13   Myriam Jacobsonobyn H Restrepo, MD  polyethylene glycol powder (GLYCOLAX/MIRALAX) powder Take 17 g by mouth daily. 04/25/14  Santiago Glad, PA-C  Prenatal Vit-Fe Fumarate-FA (PRENATAL MULTIVITAMIN) TABS tablet Take 1 tablet by mouth daily at 12 noon.    Historical Provider, MD   BP 120/55 mmHg  Pulse 71  Temp(Src) 98.8 F (37.1 C) (Oral)  Resp 18  Ht 5' (1.524 m)  Wt 136 lb (61.689 kg)  BMI 26.56 kg/m2  SpO2 96%  LMP 05/07/2014  Breastfeeding? No   Physical Exam  Constitutional: She  appears well-developed and well-nourished.  HENT:  Head: Normocephalic and atraumatic.  Eyes: Conjunctivae are normal.  Neck: Normal range of motion. Neck supple.  Pulmonary/Chest: No respiratory distress.  Genitourinary: Uterus normal. There is no rash or tenderness on the right labia. There is no rash or tenderness on the left labia. Uterus is not tender. Cervix exhibits no motion tenderness and no discharge. Right adnexum displays no mass, no tenderness and no fullness. Left adnexum displays no mass, no tenderness and no fullness. No erythema in the vagina. No foreign body around the vagina. No signs of injury around the vagina. No vaginal discharge found.  Neurological: She is alert.  Skin: Skin is warm and dry.  Psychiatric: She has a normal mood and affect.  Nursing note and vitals reviewed.   ED Course  Procedures (including critical care time) Labs Review Labs Reviewed  WET PREP, GENITAL - Abnormal; Notable for the following:    Yeast Wet Prep HPF POC FEW (*)    Clue Cells Wet Prep HPF POC FEW (*)    WBC, Wet Prep HPF POC MANY (*)    All other components within normal limits  URINALYSIS, ROUTINE W REFLEX MICROSCOPIC - Abnormal; Notable for the following:    APPearance CLOUDY (*)    Urobilinogen, UA 4.0 (*)    Leukocytes, UA SMALL (*)    All other components within normal limits  URINE MICROSCOPIC-ADD ON - Abnormal; Notable for the following:    Squamous Epithelial / LPF MANY (*)    Bacteria, UA MANY (*)    All other components within normal limits  GC/CHLAMYDIA PROBE AMP  PREGNANCY, URINE  HIV ANTIBODY (ROUTINE TESTING)  RPR    Imaging Review No results found.   EKG Interpretation None       4:54 PM Patient seen and examined. Will perform pelvic exam. Patient is in agreement with testing for syphilis and HIV.  Vital signs reviewed and are as follows: BP 120/55 mmHg  Pulse 71  Temp(Src) 98.8 F (37.1 C) (Oral)  Resp 18  Ht 5' (1.524 m)  Wt 136 lb (61.689  kg)  BMI 26.56 kg/m2  SpO2 96%  LMP 05/07/2014  Breastfeeding? No  5:58 PM Pelvic exam performed by Brixius PA-S. Patient informed of wet prep results. Upon further questioning she has had some mild itching that has not been bothersome. Given few yeast on wet prep, will give dose of Diflucan. Patient counseled on genital herpes and signs and symptoms to monitor for. Explained that we do not have a test to tell if she has an active infection and would not treat her based on the results of the blood test without physical signs and symptoms. She understands.  Patient urged to return with worsening symptoms or other concerns. Patient verbalized understanding and agrees with plan.    MDM   Final diagnoses:  Possible exposure to STD  Vaginal candidiasis   STD check performed. Patient given Diflucan given minimal itching with positive yeast on wet prep. Patient appears well, nontoxic. She is not pregnant. She  has no concerning signs or symptoms for PID.   Renne CriglerJoshua Erika Hussar, PA-C 05/14/14 1800  Vanetta MuldersScott Zackowski, MD 05/15/14 1513

## 2014-05-14 NOTE — ED Notes (Signed)
Here for STDs check. Reports one partner. Partner told her he was playing around and got herpes but later told her that he was kidding. Pt has no symptoms.

## 2014-05-16 LAB — GC/CHLAMYDIA PROBE AMP
CT Probe RNA: NEGATIVE
GC Probe RNA: NEGATIVE

## 2014-06-02 ENCOUNTER — Emergency Department (HOSPITAL_BASED_OUTPATIENT_CLINIC_OR_DEPARTMENT_OTHER)
Admission: EM | Admit: 2014-06-02 | Discharge: 2014-06-02 | Disposition: A | Discharge status: Home / Self Care | Payer: Medicaid Other | Attending: Emergency Medicine | Admitting: Emergency Medicine

## 2014-06-02 ENCOUNTER — Encounter (HOSPITAL_BASED_OUTPATIENT_CLINIC_OR_DEPARTMENT_OTHER): Payer: Self-pay

## 2014-06-02 DIAGNOSIS — Z8669 Personal history of other diseases of the nervous system and sense organs: Secondary | ICD-10-CM | POA: Diagnosis not present

## 2014-06-02 DIAGNOSIS — M6281 Muscle weakness (generalized): Secondary | ICD-10-CM | POA: Diagnosis not present

## 2014-06-02 DIAGNOSIS — Z87440 Personal history of urinary (tract) infections: Secondary | ICD-10-CM | POA: Insufficient documentation

## 2014-06-02 DIAGNOSIS — R531 Weakness: Secondary | ICD-10-CM

## 2014-06-02 DIAGNOSIS — Z3202 Encounter for pregnancy test, result negative: Secondary | ICD-10-CM | POA: Insufficient documentation

## 2014-06-02 LAB — BASIC METABOLIC PANEL
Anion gap: 10 (ref 5–15)
BUN: 13 mg/dL (ref 6–23)
CALCIUM: 9.5 mg/dL (ref 8.4–10.5)
CO2: 26 mmol/L (ref 19–32)
Chloride: 103 mEq/L (ref 96–112)
Creatinine, Ser: 0.45 mg/dL — ABNORMAL LOW (ref 0.50–1.10)
GFR calc Af Amer: >90 mL/min (ref >90)
GLUCOSE: 100 mg/dL — AB (ref 70–99)
Potassium: 3.4 mmol/L — ABNORMAL LOW (ref 3.5–5.1)
SODIUM: 139 mmol/L (ref 135–145)

## 2014-06-02 LAB — URINALYSIS, ROUTINE W REFLEX MICROSCOPIC
Bilirubin Urine: NEGATIVE
Glucose, UA: NEGATIVE mg/dL
Hgb urine dipstick: NEGATIVE
Ketones, ur: NEGATIVE mg/dL
Leukocytes, UA: NEGATIVE
NITRITE: NEGATIVE
Protein, ur: NEGATIVE mg/dL
SPECIFIC GRAVITY, URINE: 1.021 (ref 1.005–1.030)
UROBILINOGEN UA: 1 mg/dL (ref 0.0–1.0)
pH: 6 (ref 5.0–8.0)

## 2014-06-02 LAB — CBC
HEMATOCRIT: 37.4 % (ref 36.0–46.0)
HEMOGLOBIN: 11.7 g/dL — AB (ref 12.0–15.0)
MCH: 22.3 pg — AB (ref 26.0–34.0)
MCHC: 31.3 g/dL (ref 30.0–36.0)
MCV: 71.4 fL — ABNORMAL LOW (ref 78.0–100.0)
Platelets: 314 10*3/uL (ref 150–400)
RBC: 5.24 MIL/uL — ABNORMAL HIGH (ref 3.87–5.11)
RDW: 16.2 % — ABNORMAL HIGH (ref 11.5–15.5)
WBC: 13.1 10*3/uL — ABNORMAL HIGH (ref 4.0–10.5)

## 2014-06-02 LAB — PREGNANCY, URINE: PREG TEST UR: NEGATIVE

## 2014-06-02 LAB — CBG MONITORING, ED: GLUCOSE-CAPILLARY: 106 mg/dL — AB (ref 70–99)

## 2014-06-02 NOTE — Discharge Instructions (Signed)
Follow up with your primary care doctor. Rest and stay well hydrated.  Weakness Weakness is a lack of strength. It may be felt all over the body (generalized) or in one specific part of the body (focal). Some causes of weakness can be serious. You may need further medical evaluation, especially if you are elderly or you have a history of immunosuppression (such as chemotherapy or HIV), kidney disease, heart disease, or diabetes. CAUSES  Weakness can be caused by many different things, including:  Infection.  Physical exhaustion.  Internal bleeding or other blood loss that results in a lack of red blood cells (anemia).  Dehydration. This cause is more common in elderly people.  Side effects or electrolyte abnormalities from medicines, such as pain medicines or sedatives.  Emotional distress, anxiety, or depression.  Circulation problems, especially severe peripheral arterial disease.  Heart disease, such as rapid atrial fibrillation, bradycardia, or heart failure.  Nervous system disorders, such as Guillain-Barr syndrome, multiple sclerosis, or stroke. DIAGNOSIS  To find the cause of your weakness, your caregiver will take your history and perform a physical exam. Lab tests or X-rays may also be ordered, if needed. TREATMENT  Treatment of weakness depends on the cause of your symptoms and can vary greatly. HOME CARE INSTRUCTIONS   Rest as needed.  Eat a well-balanced diet.  Try to get some exercise every day.  Only take over-the-counter or prescription medicines as directed by your caregiver. SEEK MEDICAL CARE IF:   Your weakness seems to be getting worse or spreads to other parts of your body.  You develop new aches or pains. SEEK IMMEDIATE MEDICAL CARE IF:   You cannot perform your normal daily activities, such as getting dressed and feeding yourself.  You cannot walk up and down stairs, or you feel exhausted when you do so.  You have shortness of breath or chest  pain.  You have difficulty moving parts of your body.  You have weakness in only one area of the body or on only one side of the body.  You have a fever.  You have trouble speaking or swallowing.  You cannot control your bladder or bowel movements.  You have black or bloody vomit or stools. MAKE SURE YOU:  Understand these instructions.  Will watch your condition.  Will get help right away if you are not doing well or get worse. Document Released: 05/27/2005 Document Revised: 11/26/2011 Document Reviewed: 07/26/2011 [facility] Patient Information 2015 [place], [place]. This information is not intended to replace advice given to you by your health care provider. Make sure you discuss any questions you have with your health care provider. Viral Infections A virus is a type of germ. Viruses can cause:  Minor sore throats.  Aches and pains.  Headaches.  Runny nose.  Rashes.  Watery eyes.  Tiredness.  Coughs.  Loss of appetite.  Feeling sick to your stomach (nausea).  Throwing up (vomiting).  Watery poop (diarrhea). HOME CARE   Only take medicines as told by your doctor.  Drink enough water and fluids to keep your pee (urine) clear or pale yellow. Sports drinks are a good choice.  Get plenty of rest and eat healthy. Soups and broths with crackers or rice are fine. GET HELP RIGHT AWAY IF:   You have a very bad headache.  You have shortness of breath.  You have chest pain or neck pain.  You have an unusual rash.  You cannot stop throwing up.  You have watery poop that  does not stop.  You cannot keep fluids down.  You or your child has a temperature by mouth above 102 F (38.9 C), not controlled by medicine.  Your baby is older than 3 months with a rectal temperature of 102 F (38.9 C) or higher.  Your baby is 683 months old or younger with a rectal temperature of 100.4 F (38 C) or higher. MAKE SURE YOU:   Understand these instructions.  Will  watch this condition.  Will get help right away if you are not doing well or get worse. Document Released: 05/09/2008 Document Revised: 08/19/2011 Document Reviewed: 10/02/2010 [facility] Patient Information 2015 [place], [place]. This information is not intended to replace advice given to you by your health care provider. Make sure you discuss any questions you have with your health care provider.

## 2014-06-02 NOTE — ED Notes (Signed)
C/o weakness x 1 hour-denies pain,v/d

## 2014-06-02 NOTE — ED Notes (Signed)
Pt walked up to registration desk and stated that she was thirsty and felt as if she were going to pass out. Pt ambulating with quick, steady gait and in NAD. Pt's VS wnl and I explained that she could not eat or drink until seen by the MD.

## 2014-06-02 NOTE — ED Provider Notes (Signed)
CSN: [id number]     Arrival date & time 06/02/14  1637 History   First MD Initiated Contact with Patient 06/02/14 1935     Chief Complaint  Patient presents with  . Weakness     (Consider location/radiation/quality/duration/timing/severity/associated sxs/prior Treatment) HPI Comments: 23 year old female with a past medical history of GERD and muscular dystrophy presenting with generalized weakness beginning 1 hour prior to arrival. Patient [facility] and she felt slightly lightheaded, and then started to feel weak. Her lightheadedness subsided prior to arrival, however she is still feeling weak. She states while she was waiting to be seen, she started to feel very thirsty and dehydrated. She denies any pain. Denies chest pain, shortness of breath, nausea, vomiting, dizziness, numbness, tingling, confusion or vision change.  Patient is a 23 y.o. female presenting with weakness. The history is provided by the patient.  Weakness Associated symptoms include weakness.    Past Medical History  Diagnosis Date  . Muscular dystrophy   . UTI (urinary tract infection) during pregnancy    Past Surgical History  Procedure Laterality Date  . Cesarean section    . Cesarean section N/A 09/27/2013    Procedure: CESAREAN SECTION WITH BILATERAL TUBAL LIGATION;  Surgeon: Lesly DukesKelly H Leggett, MD;  Location: [facility];  Service: Obstetrics;  Laterality: N/A;  . Tubal ligation     Family History  Problem Relation Age of Onset  . Muscular dystrophy Father   . Hypertension Father    History  Substance Use Topics  . Smoking status: Never Smoker   . Smokeless tobacco: Never Used  . Alcohol Use: No   OB History    Gravida Para Term Preterm AB TAB SAB Ectopic Multiple Living   2 2 2  0      2     Review of Systems  Neurological: Positive for weakness.  All other systems reviewed and are negative.     Allergies  Review of patient's allergies indicates no known  allergies.  Home Medications   Prior to Admission medications   Not on File   BP 110/82 mmHg  Pulse 88  Temp(Src) 99.1 F (37.3 C) (Oral)  Resp 18  Ht 5\' 1"  (1.549 m)  Wt 133 lb (60.328 kg)  BMI 25.14 kg/m2  SpO2 100%  LMP 05/19/2014 Physical Exam  Constitutional: She is oriented to person, place, and time. She appears well-developed and well-nourished. No distress.  HENT:  Head: Normocephalic and atraumatic.  Mouth/Throat: Oropharynx is clear and moist.  Eyes: Conjunctivae and EOM are normal. Pupils are equal, round, and reactive to light.  Neck: Normal range of motion. Neck supple.  Cardiovascular: Normal rate, regular rhythm and normal heart sounds.   Pulmonary/Chest: Effort normal and breath sounds normal. No respiratory distress.  Abdominal: Soft. Bowel sounds are normal. There is no tenderness.  Musculoskeletal: Normal range of motion. She exhibits no edema.  Neurological: She is alert and oriented to person, place, and time. No cranial nerve deficit or sensory deficit. She displays a negative Romberg sign. Coordination and gait normal. GCS eye subscore is 4. GCS verbal subscore is 5. GCS motor subscore is 6.  No lightheadedness or dizziness on standing.  Skin: Skin is warm and dry.  Psychiatric: She has a normal mood and affect. Her behavior is normal.  Nursing note and vitals reviewed.   ED Course  Procedures (including critical care time) Labs Review Labs Reviewed  CBC - Abnormal; Notable for the following:  WBC 13.1 (*)    RBC 5.24 (*)    Hemoglobin 11.7 (*)    MCV 71.4 (*)    MCH 22.3 (*)    RDW 16.2 (*)    All other components within normal limits  BASIC METABOLIC PANEL - Abnormal; Notable for the following:    Potassium 3.4 (*)    Glucose, Bld 100 (*)    Creatinine, Ser 0.45 (*)    All other components within normal limits  CBG MONITORING, ED - Abnormal; Notable for the following:    Glucose-Capillary 106 (*)    All other components within normal  limits  URINALYSIS, ROUTINE W REFLEX MICROSCOPIC  PREGNANCY, URINE    Imaging Review No results found.   EKG Interpretation None      MDM   Final diagnoses:  Generalized weakness   Patient in no apparent distress. Afebrile, vital signs stable. Normal physical exam. No focal neurologic deficits. Symptoms are not exacerbated on standing. Urinalysis obtained in triage prior to patient being seen, no acute findings. Symptoms have a presentation of developing viral illness. Labs obtained. Leukocytosis of 13.1, no other acute findings. She is sitting on exam bed comfortably. Moist mucous membranes. I advised her to rest and stay hydrated. Follow-up with PCP. Stable for discharge. Return precautions given. Patient states understanding of treatment care plan and is agreeable.  Kathrynn SpeedRobyn M Kimberlin Scheel, PA-C 06/02/14 2101  Ethelda ChickMartha K Linker, MD 06/02/14 [phone]

## 2014-06-02 NOTE — ED Notes (Signed)
Pt ambulated to tx area w/o distress

## 2014-06-11 ENCOUNTER — Other Ambulatory Visit (HOSPITAL_BASED_OUTPATIENT_CLINIC_OR_DEPARTMENT_OTHER): Payer: Self-pay | Admitting: Family Medicine

## 2014-06-11 ENCOUNTER — Ambulatory Visit (HOSPITAL_BASED_OUTPATIENT_CLINIC_OR_DEPARTMENT_OTHER)
Admission: RE | Admit: 2014-06-11 | Discharge: 2014-06-11 | Disposition: A | Discharge status: Home / Self Care | Payer: Medicaid Other | Source: Ambulatory Visit | Attending: Family Medicine | Admitting: Family Medicine

## 2014-06-11 DIAGNOSIS — M79662 Pain in left lower leg: Secondary | ICD-10-CM | POA: Insufficient documentation

## 2014-06-11 DIAGNOSIS — M79652 Pain in left thigh: Secondary | ICD-10-CM | POA: Insufficient documentation

## 2014-06-11 DIAGNOSIS — M7989 Other specified soft tissue disorders: Principal | ICD-10-CM

## 2014-06-23 ENCOUNTER — Encounter (HOSPITAL_COMMUNITY): Payer: Self-pay | Admitting: Obstetrics & Gynecology

## 2014-09-14 ENCOUNTER — Emergency Department (HOSPITAL_BASED_OUTPATIENT_CLINIC_OR_DEPARTMENT_OTHER)
Admission: EM | Admit: 2014-09-14 | Discharge: 2014-09-14 | Disposition: A | Discharge status: Home / Self Care | Payer: Medicaid Other | Attending: Emergency Medicine | Admitting: Emergency Medicine

## 2014-09-14 ENCOUNTER — Emergency Department (HOSPITAL_BASED_OUTPATIENT_CLINIC_OR_DEPARTMENT_OTHER): Payer: Medicaid Other

## 2014-09-14 ENCOUNTER — Encounter (HOSPITAL_BASED_OUTPATIENT_CLINIC_OR_DEPARTMENT_OTHER): Payer: Self-pay

## 2014-09-14 DIAGNOSIS — R079 Chest pain, unspecified: Secondary | ICD-10-CM | POA: Diagnosis present

## 2014-09-14 DIAGNOSIS — Z87440 Personal history of urinary (tract) infections: Secondary | ICD-10-CM | POA: Diagnosis not present

## 2014-09-14 DIAGNOSIS — Z8669 Personal history of other diseases of the nervous system and sense organs: Secondary | ICD-10-CM | POA: Insufficient documentation

## 2014-09-14 DIAGNOSIS — R1013 Epigastric pain: Secondary | ICD-10-CM | POA: Insufficient documentation

## 2014-09-14 DIAGNOSIS — Z9851 Tubal ligation status: Secondary | ICD-10-CM | POA: Insufficient documentation

## 2014-09-14 LAB — TROPONIN I: Troponin I: <0.03 ng/mL (ref <0.031)

## 2014-09-14 MED ORDER — OMEPRAZOLE 20 MG PO CPDR: 20.0000 mg | DELAYED_RELEASE_CAPSULE | Freq: Every day | ORAL | Status: DC

## 2014-09-14 MED ORDER — GI COCKTAIL ~~LOC~~
30.0000 mL | Freq: Once | ORAL | Status: AC
  Administered 2014-09-14: 30 mL via ORAL
  Filled 2014-09-14: qty 30

## 2014-09-14 NOTE — ED Provider Notes (Signed)
CSN: [id number]     Arrival date & time 09/14/14  1300 History   First MD Initiated Contact with Patient 09/14/14 1451     Chief Complaint  Patient presents with  . Chest Pain     (Consider location/radiation/quality/duration/timing/severity/associated sxs/prior Treatment) HPI Elizabeth Hooper is a 24 y.o. female Who comes in for evaluation of chest discomfort. Patient states for the past 8 or 9 months she has had a burning discomfort in the bottom of her chest to the top of her stomach that occurs after she eats. She reports having taken Tums and Rolaids in the past with some relief of her symptoms. She reports the discomfort is a 2/10. Denies discomfort now. She denies any overt chest pain, shortness of breath, nausea or vomiting, overt abdominal pain, diarrhea or constipation. She denies fevers, history of blood clot, hemoptysis, unilateral leg swelling, recent surgeries or travels.no other aggravating or modifying factors. PCP his [facility]. Last seen 2 months ago.  Past Medical History  Diagnosis Date  . Muscular dystrophy   . UTI (urinary tract infection) during pregnancy    Past Surgical History  Procedure Laterality Date  . Cesarean section    . Cesarean section N/A 09/27/2013    Procedure: CESAREAN SECTION WITH BILATERAL TUBAL LIGATION;  Surgeon: Lesly DukesKelly H Leggett, MD;  Location: [facility];  Service: Obstetrics;  Laterality: N/A;  . Tubal ligation     Family History  Problem Relation Age of Onset  . Muscular dystrophy Father   . Hypertension Father    History  Substance Use Topics  . Smoking status: Never Smoker   . Smokeless tobacco: Never Used  . Alcohol Use: No   OB History    Gravida Para Term Preterm AB TAB SAB Ectopic Multiple Living   2 2 2  0      2     Review of Systems A 10 point review of systems was completed and was negative except for pertinent positives and negatives as mentioned in the history of present illness     Allergies  Review of  patient's allergies indicates no known allergies.  Home Medications   Prior to Admission medications   Medication Sig Start Date End Date Taking? Authorizing Provider  omeprazole (PRILOSEC) 20 MG capsule Take 1 capsule (20 mg total) by mouth daily. 09/14/14   Senya Hinzman, PA-C   BP 118/66 mmHg  Pulse 63  Temp(Src) 98.3 F (36.8 C) (Oral)  Resp 18  Ht 5\' 1"  (1.549 m)  Wt 136 lb (61.689 kg)  BMI 25.71 kg/m2  SpO2 100%  LMP 09/12/2014 Physical Exam  Constitutional: She is oriented to person, place, and time. She appears well-developed and well-nourished.  HENT:  Head: Normocephalic and atraumatic.  Mouth/Throat: Oropharynx is clear and moist.  Eyes: Conjunctivae are normal. Pupils are equal, round, and reactive to light. Right eye exhibits no discharge. Left eye exhibits no discharge. No scleral icterus.  Neck: Neck supple.  Cardiovascular: Normal rate, regular rhythm and normal heart sounds.   Pulmonary/Chest: Effort normal and breath sounds normal. No respiratory distress. She has no wheezes. She has no rales.  Abdominal: Soft. There is no tenderness.  Musculoskeletal: She exhibits no tenderness.  Neurological: She is alert and oriented to person, place, and time.  Cranial Nerves II-XII grossly intact  Skin: Skin is warm and dry. No rash noted.  Psychiatric: She has a normal mood and affect.  Nursing note and vitals reviewed.   ED Course  Procedures (including  critical care time) Labs Review Labs Reviewed  TROPONIN I    Imaging Review Dg Chest 2 View  09/14/2014   CLINICAL DATA:  Mid chest pain radiating to stomach for 9 months, history reflux  EXAM: CHEST  2 VIEW  COMPARISON:  05/12/2014  FINDINGS: Normal heart size, mediastinal contours, and pulmonary vascularity.  Minimal bronchitic changes.  Lungs otherwise clear.  No pleural effusion or pneumothorax.  Bones unremarkable.  IMPRESSION: Minimal bronchitic changes without infiltrate.   Electronically Signed   By: Ulyses Southward M.D.   On: 09/14/2014 15:11     EKG Interpretation   Date/Time:  Wednesday September 14 2014 13:21:38 EDT Ventricular Rate:  62 PR Interval:  156 QRS Duration: 86 QT Interval:  402 QTC Calculation: 408 R Axis:   71 Text Interpretation:  Normal sinus rhythm with sinus arrhythmia Normal ECG  Confirmed by ZACKOWSKI  MD, SCOTT [phone]) on 09/14/2014 1:24:20 PM     Meds given in ED:  Medications  gi cocktail (Maalox,Lidocaine,Donnatal) (30 mLs Oral Given 09/14/14 1548)    Discharge Medication List as of 09/14/2014  4:54 PM    START taking these medications   Details  omeprazole (PRILOSEC) 20 MG capsule Take 1 capsule (20 mg total) by mouth daily., Starting 09/14/2014, Until Discontinued, Print       Filed Vitals:   09/14/14 1324  BP: 118/66  Pulse: 63  Temp: 98.3 F (36.8 C)  TempSrc: Oral  Resp: 18  Height:  (1.549 m)  Weight: 136 lb (61.689 kg)  SpO2: 100%    MDM  Vitals stable - WNL -afebrile Pt resting comfortably in ED. PE--grossly benign physical exam Labwork-troponin negative. EKG not concerning Imaging-chest x-ray shows no acute current pulmonary pathology.  DDX-clinical picture physical exam consistent with GI source, likely GERD. Will initiate patient on PPI and have her follow-up with her PCP at [facility] urgent care. Low concern for cardiac or pulmonary etiology at this point. Given extended timing,presentation of symptoms I doubt ACS. PERC negative.  I discussed all relevant lab findings and imaging results with pt and they verbalized understanding. Discussed f/u with PCP within 48 hrs and return precautions, pt very amenable to plan.  Final diagnoses:  Abdominal discomfort, epigastric        Joycie Peek, PA-C 09/14/14 2308  Glynn Octave, MD 09/15/14 [phone]

## 2014-09-14 NOTE — ED Notes (Signed)
Patient asked to change into a gown.  

## 2014-09-14 NOTE — ED Notes (Signed)
C/o CP, epigastric pain x 5 days-reports hx of same with dx GERD-states pain is worse after eating

## 2014-09-14 NOTE — ED Notes (Signed)
Patient transported to and from x-ray.

## 2014-11-17 ENCOUNTER — Encounter (HOSPITAL_COMMUNITY): Payer: Self-pay | Admitting: Obstetrics & Gynecology

## 2016-07-30 ENCOUNTER — Ambulatory Visit: Payer: Medicaid Other | Admitting: Advanced Practice Midwife

## 2016-10-06 ENCOUNTER — Emergency Department (HOSPITAL_BASED_OUTPATIENT_CLINIC_OR_DEPARTMENT_OTHER): Payer: Medicaid Other

## 2016-10-06 ENCOUNTER — Emergency Department (HOSPITAL_BASED_OUTPATIENT_CLINIC_OR_DEPARTMENT_OTHER)
Admission: EM | Admit: 2016-10-06 | Discharge: 2016-10-06 | Disposition: A | Discharge status: Home / Self Care | Payer: Medicaid Other | Attending: Emergency Medicine | Admitting: Emergency Medicine

## 2016-10-06 ENCOUNTER — Encounter (HOSPITAL_BASED_OUTPATIENT_CLINIC_OR_DEPARTMENT_OTHER): Payer: Self-pay | Admitting: Emergency Medicine

## 2016-10-06 DIAGNOSIS — I880 Nonspecific mesenteric lymphadenitis: Secondary | ICD-10-CM | POA: Insufficient documentation

## 2016-10-06 DIAGNOSIS — R1031 Right lower quadrant pain: Secondary | ICD-10-CM | POA: Diagnosis present

## 2016-10-06 LAB — URINALYSIS, ROUTINE W REFLEX MICROSCOPIC
Bilirubin Urine: NEGATIVE
Glucose, UA: NEGATIVE mg/dL
KETONES UR: NEGATIVE mg/dL
NITRITE: POSITIVE — AB
PH: 6 (ref 5.0–8.0)
PROTEIN: NEGATIVE mg/dL
Specific Gravity, Urine: 1.008 (ref 1.005–1.030)

## 2016-10-06 LAB — URINALYSIS, MICROSCOPIC (REFLEX)

## 2016-10-06 LAB — CBC WITH DIFFERENTIAL/PLATELET
BASOS ABS: 0 10*3/uL (ref 0.0–0.1)
Basophils Relative: 0 %
Eosinophils Absolute: 0.2 10*3/uL (ref 0.0–0.7)
Eosinophils Relative: 2 %
HEMATOCRIT: 36.9 % (ref 36.0–46.0)
HEMOGLOBIN: 11.9 g/dL — AB (ref 12.0–15.0)
Lymphocytes Relative: 16 %
Lymphs Abs: 1.4 10*3/uL (ref 0.7–4.0)
MCH: 23.9 pg — AB (ref 26.0–34.0)
MCHC: 32.2 g/dL (ref 30.0–36.0)
MCV: 74.2 fL — ABNORMAL LOW (ref 78.0–100.0)
MONOS PCT: 6 %
Monocytes Absolute: 0.5 10*3/uL (ref 0.1–1.0)
NEUTROS PCT: 76 %
Neutro Abs: 6.7 10*3/uL (ref 1.7–7.7)
Platelets: 226 10*3/uL (ref 150–400)
RBC: 4.97 MIL/uL (ref 3.87–5.11)
RDW: 15.5 % (ref 11.5–15.5)
WBC: 8.7 10*3/uL (ref 4.0–10.5)

## 2016-10-06 LAB — COMPREHENSIVE METABOLIC PANEL
ALT: 18 U/L (ref 14–54)
AST: 20 U/L (ref 15–41)
Albumin: 3.8 g/dL (ref 3.5–5.0)
Alkaline Phosphatase: 69 U/L (ref 38–126)
Anion gap: 9 (ref 5–15)
BILIRUBIN TOTAL: 0.6 mg/dL (ref 0.3–1.2)
BUN: 6 mg/dL (ref 6–20)
CO2: 22 mmol/L (ref 22–32)
CREATININE: 0.5 mg/dL (ref 0.44–1.00)
Calcium: 8.4 mg/dL — ABNORMAL LOW (ref 8.9–10.3)
Chloride: 105 mmol/L (ref 101–111)
GFR calc non Af Amer: >60 mL/min (ref >60)
Glucose, Bld: 105 mg/dL — ABNORMAL HIGH (ref 65–99)
Potassium: 3.5 mmol/L (ref 3.5–5.1)
Sodium: 136 mmol/L (ref 135–145)
TOTAL PROTEIN: 7.7 g/dL (ref 6.5–8.1)

## 2016-10-06 LAB — PREGNANCY, URINE: PREG TEST UR: NEGATIVE

## 2016-10-06 LAB — LIPASE, BLOOD: LIPASE: 14 U/L (ref 11–51)

## 2016-10-06 MED ORDER — ONDANSETRON HCL 4 MG PO TABS: 4.0000 mg | ORAL_TABLET | Freq: Four times a day (QID) | ORAL | 0 refills | Status: DC

## 2016-10-06 MED ORDER — ONDANSETRON HCL 4 MG/2ML IJ SOLN
4.0000 mg | Freq: Once | INTRAMUSCULAR | Status: AC
  Administered 2016-10-06: 4 mg via INTRAVENOUS
  Filled 2016-10-06: qty 2

## 2016-10-06 MED ORDER — IOPAMIDOL (ISOVUE-300) INJECTION 61%
100.0000 mL | Freq: Once | INTRAVENOUS | Status: AC | PRN
  Administered 2016-10-06: 100 mL via INTRAVENOUS

## 2016-10-06 MED ORDER — SODIUM CHLORIDE 0.9 % IV BOLUS (SEPSIS)
1000.0000 mL | Freq: Once | INTRAVENOUS | Status: AC
  Administered 2016-10-06: 1000 mL via INTRAVENOUS

## 2016-10-06 NOTE — ED Notes (Signed)
Patient transported to CT 

## 2016-10-06 NOTE — ED Provider Notes (Signed)
[facility] Provider Note   CSN: [id number] Arrival date & time: 10/06/16  1657   By signing my name below, I, [name], attest that this documentation has been prepared under the direction and in the presence of Gwyneth Sprout, MD . Electronically Signed: [name], ED Scribe. 10/06/2016. 5:46 PM.   History   Chief Complaint Chief Complaint  Patient presents with  . Abdominal Pain    The history is provided by the patient. No language interpreter was used.   HPI Comments:  Elizabeth Hooper is a 26 y.o. female who presents to the Emergency Department complaining of constant abdominal pain x 2 days. She states that the pain is primarily on the right side, and she describes the pain as "dull, aching." She states that at first the pain was intermittent but it is now constant. She states that the pain is worse with laying down and drinking fluids, and she has not eaten since onset. Pt complains of associated diarrhea x 10 since last night, and vomiting x 1 this AM. LNMP was 09/09/2016. Pt is sexually active with 1 female partner, and does not use protection or birth control. Denies dysuria, hematuria, fever, vaginal discharge, cough.   Past Medical History:  Diagnosis Date  . Muscular dystrophy (HCC)   . UTI (urinary tract infection) during pregnancy     Patient Active Problem List   Diagnosis Date Noted  . Normal labor 09/29/2013  . Supervision of high risk pregnancy in third trimester 07/22/2013  . 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 Previous cesarean section complicating pregnancy 07/22/2013  . GBS bacteriuria 07/16/2013    Past Surgical History:  Procedure Laterality Date  . CESAREAN SECTION    . CESAREAN SECTION N/A 09/27/2013   Procedure: CESAREAN SECTION WITH BILATERAL TUBAL LIGATION;  Surgeon: Lesly Dukes, MD;  Location: [facility];  Service: Obstetrics;  Laterality: N/A;  . TUBAL LIGATION      OB History    Gravida Para Term Preterm AB  Living   0   2   SAB TAB Ectopic Multiple Live Births           2       Home Medications    Prior to Admission medications   Medication Sig Start Date End Date Taking? Authorizing Provider  omeprazole (PRILOSEC) 20 MG capsule Take 1 capsule (20 mg total) by mouth daily. 09/14/14   Joycie Peek, PA-C    Family History Family History  Problem Relation Age of Onset  . Muscular dystrophy Father   . Hypertension Father     Social History Social History  Substance Use Topics  . Smoking status: Never Smoker  . Smokeless tobacco: Never Used  . Alcohol use No     Allergies   Hydrocodone and Penicillins   Review of Systems Review of Systems   Physical Exam Updated Vital Signs BP 125/72 (BP Location: Left Arm)   Pulse 92   Temp 99 F (37.2 C) (Oral)   Resp 19   Ht  (1.549 m)   Wt 183 lb (83 kg)   LMP 09/10/2016 (Approximate)   SpO2 100%   BMI 34.58 kg/m   Physical Exam  Constitutional: She appears well-developed and well-nourished. No distress.  HENT:  Head: Normocephalic and atraumatic.  Eyes: Conjunctivae are normal.  Cardiovascular: Normal rate, regular rhythm, normal heart sounds and intact distal pulses.   No murmur heard. Pulmonary/Chest: Effort normal and breath sounds normal. She  has no wheezes. She has no rales.  Abdominal: She exhibits no distension.  RLQ pain with mild guarding. No rebound. Minimal RUQ pain.  Neurological: She is alert.  Skin: Skin is warm and dry.  Psychiatric: She has a normal mood and affect.  Nursing note and vitals reviewed.    ED Treatments / Results  DIAGNOSTIC STUDIES:  Oxygen Saturation is 100% on RA, normal by my interpretation.    COORDINATION OF CARE:  5:40 PM Discussed treatment plan with pt at bedside and pt agreed to plan.   Labs (all labs ordered are listed, but only abnormal results are displayed) Labs Reviewed  CBC WITH DIFFERENTIAL/PLATELET - Abnormal; Notable for the following:        Result Value   Hemoglobin 11.9 (*)    MCV 74.2 (*)    MCH 23.9 (*)    All other components within normal limits  COMPREHENSIVE METABOLIC PANEL  LIPID PANEL  LIPASE, BLOOD    EKG  EKG Interpretation None       Radiology Ct Abdomen Pelvis W Contrast  Result Date: 10/06/2016 CLINICAL DATA:  Right lower quadrant pain for 2 days, nausea and vomiting EXAM: CT ABDOMEN AND PELVIS WITH CONTRAST TECHNIQUE: Multidetector CT imaging of the abdomen and pelvis was performed using the standard protocol following bolus administration of intravenous contrast. CONTRAST:  ISOVUE-300 IOPAMIDOL (ISOVUE-300) INJECTION 61% COMPARISON:  05/09/2015 FINDINGS: Lower chest: No acute consolidation or pleural effusion. The heart is nonenlarged. Hepatobiliary: No focal liver abnormality is seen. No gallstones, gallbladder wall thickening, or biliary dilatation. Pancreas: Unremarkable. No pancreatic ductal dilatation or surrounding inflammatory changes. Spleen: Normal in size without focal abnormality. Adrenals/Urinary Tract: Adrenal glands are unremarkable. Kidneys are normal, without renal calculi, focal lesion, or hydronephrosis. Bladder is unremarkable. Stomach/Bowel: Stomach is within normal limits. Appendix appears normal. No evidence of bowel wall thickening, distention, or inflammatory changes. Vascular/Lymphatic: Few prominent right lower quadrant mesenteric lymph nodes, measuring up to 6 mm. Non aneurysmal aorta. Reproductive: Tubal ligation clip on the left.  No adnexal masses. Other: No free air.  Trace free fluid in the pelvis. Musculoskeletal: Prominent os acetabuli. No acute or suspicious bone lesion IMPRESSION: 1. Negative for acute appendicitis. 2. Nonspecific right lower quadrant mesenteric lymph nodes. Electronically Signed   By: Jasmine Pang M.D.   On: 10/06/2016 20:13    Procedures Procedures (including critical care time)  Medications Ordered in ED Medications - No data to  display   Initial Impression / Assessment and Plan / ED Course  I have reviewed the triage vital signs and the nursing notes.  Pertinent labs & imaging results that were available during my care of the patient were reviewed by me and considered in my medical decision making (see chart for details).     Patient with worsening abdominal pain over the last 2 days. Initially intermittent and now constant in the right lower quadrant. Eating makes it worse. She also developed diarrhea today and one episode of vomiting. She denies any urinary or vaginal symptoms. She is sexually active and does not use protection but has had the same partner for the last year. She denies fever. On exam patient has right lower quadrant tenderness with some guarding but no rebound.  Urine pregnancy test is negative, UA with positive nitrites but no white blood cells with low suspicion for a UTI. CBC within normal limits and CMP within normal limits. Lipase is negative. Given patient's symptoms will do a CT to rule out appendicitis however  this could be mesenteric adenitis or viral syndrome. Lower suspicion for ovarian pathology or STD. Low suspicion for UTI or pyelonephritis. Possible kidney stone but that should also be apparent on CT.  Patient given IV fluids but at this time does not want pain or nausea medication  8:32 PM CT scan shows enlarged lymph nodes in the right lower quadrant but no evidence of acute appendicitis. Most likely viral in nature. On reevaluation patient's pain is not worsened. Again low suspicion for pelvic pathology. Patient discharged home with anti-medic  Final Clinical Impressions(s) / ED Diagnoses   Final diagnoses:  Mesenteric adenitis    New Prescriptions New Prescriptions   ONDANSETRON (ZOFRAN) 4 MG TABLET    Take 1 tablet (4 mg total) by mouth every 6 (six) hours.   I personally performed the services described in this documentation, which was scribed in my presence.  The recorded  information has been reviewed and considered.       Gwyneth Sprout, MD 10/06/16 2034

## 2016-10-06 NOTE — ED Notes (Signed)
Pt given d/c instructions as per chart. Verbalizes understanding. No questions. 

## 2016-10-06 NOTE — ED Notes (Signed)
ED Provider at bedside. 

## 2016-10-06 NOTE — Discharge Instructions (Signed)
If diarrhea becomes worse you can take Imodium

## 2016-10-06 NOTE — ED Triage Notes (Signed)
Patient reports abdominal pain x 2 days.  Reports vomiting and diarrhea as well.  States pain is worse on the right.  Denies dysuria, hematuria.

## 2018-02-16 ENCOUNTER — Other Ambulatory Visit: Payer: Self-pay

## 2018-02-16 ENCOUNTER — Encounter (HOSPITAL_BASED_OUTPATIENT_CLINIC_OR_DEPARTMENT_OTHER): Payer: Self-pay

## 2018-02-16 ENCOUNTER — Emergency Department (HOSPITAL_BASED_OUTPATIENT_CLINIC_OR_DEPARTMENT_OTHER)
Admission: EM | Admit: 2018-02-16 | Discharge: 2018-02-17 | Disposition: A | Discharge status: Home / Self Care | Payer: Medicaid Other | Attending: Emergency Medicine | Admitting: Emergency Medicine

## 2018-02-16 DIAGNOSIS — Z5321 Procedure and treatment not carried out due to patient leaving prior to being seen by health care provider: Secondary | ICD-10-CM | POA: Insufficient documentation

## 2018-02-16 DIAGNOSIS — M79622 Pain in left upper arm: Secondary | ICD-10-CM | POA: Diagnosis present

## 2018-02-16 DIAGNOSIS — M79621 Pain in right upper arm: Secondary | ICD-10-CM | POA: Insufficient documentation

## 2018-02-16 NOTE — ED Triage Notes (Signed)
Pt c/o bilateral axillary pain for the last few hours, pt has not had any medication for symptoms

## 2018-02-17 NOTE — ED Notes (Signed)
No answer when called for a room. 

## 2018-02-17 NOTE — ED Notes (Signed)
No answer

## 2018-02-25 ENCOUNTER — Encounter (HOSPITAL_BASED_OUTPATIENT_CLINIC_OR_DEPARTMENT_OTHER): Payer: Self-pay

## 2018-02-25 ENCOUNTER — Other Ambulatory Visit: Payer: Self-pay

## 2018-02-25 ENCOUNTER — Emergency Department (HOSPITAL_BASED_OUTPATIENT_CLINIC_OR_DEPARTMENT_OTHER)
Admission: EM | Admit: 2018-02-25 | Discharge: 2018-02-25 | Disposition: A | Discharge status: Home / Self Care | Payer: Medicaid Other | Attending: Emergency Medicine | Admitting: Emergency Medicine

## 2018-02-25 DIAGNOSIS — Z3202 Encounter for pregnancy test, result negative: Secondary | ICD-10-CM | POA: Diagnosis not present

## 2018-02-25 DIAGNOSIS — G7100 Muscular dystrophy, unspecified: Secondary | ICD-10-CM | POA: Diagnosis not present

## 2018-02-25 DIAGNOSIS — J020 Streptococcal pharyngitis: Secondary | ICD-10-CM | POA: Diagnosis not present

## 2018-02-25 DIAGNOSIS — R05 Cough: Secondary | ICD-10-CM | POA: Diagnosis present

## 2018-02-25 LAB — PREGNANCY, URINE: Preg Test, Ur: NEGATIVE

## 2018-02-25 LAB — GROUP A STREP BY PCR: Group A Strep by PCR: DETECTED — AB

## 2018-02-25 MED ORDER — DEXAMETHASONE SODIUM PHOSPHATE 10 MG/ML IJ SOLN
10.0000 mg | Freq: Once | INTRAMUSCULAR | Status: AC
Start: 2018-02-25 — End: 2018-02-25
  Administered 2018-02-25: 10 mg via INTRAMUSCULAR
  Filled 2018-02-25: qty 1

## 2018-02-25 MED ORDER — AZITHROMYCIN 250 MG PO TABS: 250.0000 mg | ORAL_TABLET | Freq: Every day | ORAL | 0 refills | Status: DC

## 2018-02-25 MED ORDER — KETOROLAC TROMETHAMINE 60 MG/2ML IM SOLN
60.0000 mg | Freq: Once | INTRAMUSCULAR | Status: AC
  Administered 2018-02-25: 60 mg via INTRAMUSCULAR
  Filled 2018-02-25: qty 2

## 2018-02-25 NOTE — Discharge Instructions (Signed)
You can take Tylenol or Ibuprofen as directed for pain. You can alternate Tylenol and Ibuprofen every 4 hours. If you take Tylenol at 1pm, then you can take Ibuprofen at 5pm. Then you can take Tylenol again at 9pm.   Take antibiotics as directed. Please take all of your antibiotics until finished.  With your primary care doctor the next 2 to 3 days for further evaluation.  Return the emergency department for any worsening pain, difficult to swelling, throwing up, difficulty breathing, fevers or any other worsening or concerning symptoms.

## 2018-02-25 NOTE — ED Triage Notes (Signed)
C/o flu like sx x 1 week-NAD-steady gait 

## 2018-02-25 NOTE — ED Provider Notes (Signed)
[facility] EMERGENCY DEPARTMENT Provider Note   CSN: [id number] Arrival date & time: 02/25/18  1834     History   Chief Complaint Chief Complaint  Patient presents with  . Cough    HPI Elizabeth Hooper is a 27 y.o. female with PMH/o Muscular dystrophy presents for evaluation of 3 days of sore throat.  Patient reports that over the last 3 days, is progressively worsened.  She has been taking Tylenol at home with minimal improvement in symptoms.  Patient states that she has been able to tolerate secretions and p.o. but does report some worsening pain with swallowing.  She states she is also had a dry cough and some associated nasal congestion, rhinorrhea.  She went to her primary care doctor on Monday but states that she was told that she did not have strep.  Was told to use over-the-counter medications.  Patient denies any fevers, chest pain, difficulty breathing.  The history is provided by the patient.    Past Medical History:  Diagnosis Date  . Muscular dystrophy (HCC)   . UTI (urinary tract infection) during pregnancy     Patient Active Problem List   Diagnosis Date Noted  . Normal labor 09/29/2013  . Supervision of high risk pregnancy in third trimester 07/22/2013  . 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 Previous cesarean section complicating pregnancy 07/22/2013  . GBS bacteriuria 07/16/2013    Past Surgical History:  Procedure Laterality Date  . CESAREAN SECTION    . CESAREAN SECTION N/A 09/27/2013   Procedure: CESAREAN SECTION WITH BILATERAL TUBAL LIGATION;  Surgeon: Lesly Dukes, MD;  Location: [facility];  Service: Obstetrics;  Laterality: N/A;  . TUBAL LIGATION       OB History    Gravida  2   Para  2   Term  2   Preterm  0   AB      Living  2     SAB      TAB      Ectopic      Multiple      Live Births  2            Home Medications    Prior to Admission medications   Medication Sig Start Date End Date Taking? Authorizing  Provider  azithromycin (ZITHROMAX) 250 MG tablet Take 1 tablet (250 mg total) by mouth daily. Take first 2 tablets together, then 1 every day until finished. 02/25/18   Maxwell Caul, PA-C    Family History Family History  Problem Relation Age of Onset  . Muscular dystrophy Father   . Hypertension Father     Social History Social History   Tobacco Use  . Smoking status: Never Smoker  . Smokeless tobacco: Never Used  Substance Use Topics  . Alcohol use: No  . Drug use: No     Allergies   Hydrocodone and Penicillins   Review of Systems Review of Systems  Constitutional: Negative for fever.  HENT: Positive for sore throat. Negative for drooling and trouble swallowing.   Respiratory: Positive for cough. Negative for shortness of breath.   All other systems reviewed and are negative.    Physical Exam Updated Vital Signs BP 118/76 (BP Location: Left Arm)   Pulse 66   Temp 98.7 F (37.1 C) (Oral)   LMP 01/29/2018   SpO2 96%   Physical Exam  Constitutional: She appears well-developed and well-nourished.  HENT:  Head: Normocephalic and atraumatic.  Mouth/Throat: Uvula is midline.  No trismus in the jaw. Posterior oropharyngeal erythema present. Tonsillar exudate.  Posterior oropharynx is erythematous.  There is some small exudates noted on the right tonsil.  Uvula is midline.  No trismus.  No tonsillar asymmetry.  Airways patent, phonation is intact.  Eyes: Conjunctivae and EOM are normal. Right eye exhibits no discharge. Left eye exhibits no discharge. No scleral icterus.  Pulmonary/Chest: Effort normal and breath sounds normal.  Lungs clear to auscultation bilaterally.  Symmetric chest rise.  No wheezing, rales, rhonchi.  Neurological: She is alert.  Skin: Skin is warm and dry.  Psychiatric: She has a normal mood and affect. Her speech is normal and behavior is normal.  Nursing note and vitals reviewed.    ED Treatments / Results  Labs (all labs ordered are  listed, but only abnormal results are displayed) Labs Reviewed  GROUP A STREP BY PCR - Abnormal; Notable for the following components:      Result Value   Group A Strep by PCR DETECTED (*)    All other components within normal limits  PREGNANCY, URINE    EKG None  Radiology No results found.  Procedures Procedures (including critical care time)  Medications Ordered in ED Medications  ketorolac (TORADOL) injection 60 mg (60 mg Intramuscular Given 02/25/18 2102)  dexamethasone (DECADRON) injection 10 mg (10 mg Intramuscular Given 02/25/18 2102)     Initial Impression / Assessment and Plan / ED Course  I have reviewed the triage vital signs and the nursing notes.  Pertinent labs & imaging results that were available during my care of the patient were reviewed by me and considered in my medical decision making (see chart for details).     27 year old female who presents for evaluation of sore throat x3 days.  Associate with dry cough, nasal congestion.  No fevers.  Able to tolerate secretions. Patient is afebrile, non-toxic appearing, sitting comfortably on examination table. Vital signs reviewed and stable.  On exam, patient has some posterior oropharynx erythema and right-sided tonsillar exudates.  No tonsillar asymmetry.  Uvula is midline.  No trismus.  Consider pharyngitis versus upper respiratory infection.  History/physical exam is not concerning for Ludwig angina or peritonsillar abscess.  Will plan for strep.  Strep reviewed.  It is positive.  Discussed results with patient.  Will give some analgesics here in the ED.  Patient is allergic to penicillins, will therefore treat with azithromycin.  Encourage at home supportive care measures. Patient had ample opportunity for questions and discussion. All patient's questions were answered with full understanding. Strict return precautions discussed. Patient expresses understanding and agreement to plan.   Final Clinical Impressions(s)  / ED Diagnoses   Final diagnoses:  Strep pharyngitis    ED Discharge Orders         Ordered    azithromycin (ZITHROMAX) 250 MG tablet  Daily     02/25/18 2125           Maxwell CaulLayden, Tonie Elsey A, PA-C 02/25/18 2352    Pricilla LovelessGoldston, Scott, MD 02/26/18 [phone]

## 2018-03-04 ENCOUNTER — Encounter (HOSPITAL_BASED_OUTPATIENT_CLINIC_OR_DEPARTMENT_OTHER): Payer: Self-pay | Admitting: *Deleted

## 2018-03-04 ENCOUNTER — Other Ambulatory Visit: Payer: Self-pay

## 2018-03-04 ENCOUNTER — Emergency Department (HOSPITAL_BASED_OUTPATIENT_CLINIC_OR_DEPARTMENT_OTHER)
Admission: EM | Admit: 2018-03-04 | Discharge: 2018-03-04 | Disposition: A | Discharge status: Home / Self Care | Payer: Medicaid Other | Attending: Emergency Medicine | Admitting: Emergency Medicine

## 2018-03-04 DIAGNOSIS — J029 Acute pharyngitis, unspecified: Secondary | ICD-10-CM | POA: Diagnosis present

## 2018-03-04 MED ORDER — CLINDAMYCIN HCL 300 MG PO CAPS: 300.0000 mg | ORAL_CAPSULE | Freq: Three times a day (TID) | ORAL | 0 refills | Status: DC

## 2018-03-04 MED FILL — CLINDAMYCIN HCL 300 MG CAP: 300 | 7 days supply | Qty: 21 | Fill #0

## 2018-03-04 NOTE — ED Triage Notes (Signed)
Pt reports sore throat x 2 weeks, seen here last week dx with strep, given "shot and pills" cont with sore throat, denies fevers or congestion.

## 2018-03-07 NOTE — ED Provider Notes (Signed)
[facility] EMERGENCY DEPARTMENT Provider Note   CSN: [id number] Arrival date & time: 03/04/18  0806     History   Chief Complaint Chief Complaint  Patient presents with  . Sore Throat    HPI Elizabeth Hooper is a 27 y.o. female.  HPI Patient is a 27 year old female presents the emergency department with ongoing sore throat over the past 2 weeks.  Initially is treated with antibiotics and felt like she was improving.  She is returned to having sore throat again over the past several days.  No documented fevers.  No difficulty breathing or swallowing.  She completed the full course of antibiotics.  She denies anterior neck pain.  No dental pain.  Symptoms are mild to moderate in severity.   Past Medical History:  Diagnosis Date  . Muscular dystrophy (HCC)   . UTI (urinary tract infection) during pregnancy     Patient Active Problem List   Diagnosis Date Noted  . Normal labor 09/29/2013  . Supervision of high risk pregnancy in third trimester 07/22/2013  . 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 Previous cesarean section complicating pregnancy 07/22/2013  . GBS bacteriuria 07/16/2013    Past Surgical History:  Procedure Laterality Date  . CESAREAN SECTION    . CESAREAN SECTION N/A 09/27/2013   Procedure: CESAREAN SECTION WITH BILATERAL TUBAL LIGATION;  Surgeon: Lesly Dukes, MD;  Location: [facility];  Service: Obstetrics;  Laterality: N/A;  . TUBAL LIGATION       OB History    Gravida  2   Para  2   Term  2   Preterm  0   AB      Living  2     SAB      TAB      Ectopic      Multiple      Live Births  2            Home Medications    Prior to Admission medications   Medication Sig Start Date End Date Taking? Authorizing Provider  clindamycin (CLEOCIN) 300 MG capsule Take 1 capsule (300 mg total) by mouth 3 (three) times daily. 03/04/18   Azalia Bilis, MD    Family History Family History  Problem Relation Age of Onset  . Muscular  dystrophy Father   . Hypertension Father     Social History Social History   Tobacco Use  . Smoking status: Never Smoker  . Smokeless tobacco: Never Used  Substance Use Topics  . Alcohol use: No  . Drug use: No     Allergies   Hydrocodone and Penicillins   Review of Systems Review of Systems  All other systems reviewed and are negative.    Physical Exam Updated Vital Signs BP 119/77 (BP Location: Left Arm)   Pulse 79   Temp 98.5 F (36.9 C) (Oral)   Resp 16   Ht 5\' 1"  (1.549 m)   Wt 89.4 kg   LMP 02/25/2018   SpO2 100%   BMI 37.22 kg/m   Physical Exam  Constitutional: She is oriented to person, place, and time. She appears well-developed and well-nourished. No distress.  HENT:  Head: Normocephalic and atraumatic.  Uvula midline.  Mild posterior pharyngeal and bilateral tonsillar erythema without significant exudates.  Tolerating secretions.  Oral airway patent.  Dentition without significant abnormality.  Tongue is normal in size.  Anterior neck normal  Eyes: EOM are normal.  Neck: Normal range of motion.  Cardiovascular: Normal rate,  regular rhythm and normal heart sounds.  Pulmonary/Chest: Effort normal and breath sounds normal.  Abdominal: Soft. She exhibits no distension. There is no tenderness.  Musculoskeletal: Normal range of motion.  Neurological: She is alert and oriented to person, place, and time.  Skin: Skin is warm and dry.  Psychiatric: She has a normal mood and affect. Judgment normal.  Nursing note and vitals reviewed.    ED Treatments / Results  Labs (all labs ordered are listed, but only abnormal results are displayed) Labs Reviewed - No data to display  EKG None  Radiology No results found.  Procedures Procedures (including critical care time)  Medications Ordered in ED Medications - No data to display   Initial Impression / Assessment and Plan / ED Course  I have reviewed the triage vital signs and the nursing  notes.  Pertinent labs & imaging results that were available during my care of the patient were reviewed by me and considered in my medical decision making (see chart for details).     Overall well-appearing.  Mild pharyngitis.  Patient will be treated with a course of clindamycin to help minimize risk of developing separative type infection.  Primary care follow-up.  Understands return to the ER for new or worsening symptoms.  Does not need advanced imaging at this time.  Final Clinical Impressions(s) / ED Diagnoses   Final diagnoses:  Acute pharyngitis, unspecified etiology    ED Discharge Orders         Ordered    clindamycin (CLEOCIN) 300 MG capsule  3 times daily     03/04/18 0845           Azalia Bilis, MD 03/07/18 [phone]

## 2018-03-12 ENCOUNTER — Emergency Department (HOSPITAL_BASED_OUTPATIENT_CLINIC_OR_DEPARTMENT_OTHER)
Admission: EM | Admit: 2018-03-12 | Discharge: 2018-03-12 | Disposition: A | Discharge status: Home / Self Care | Payer: Medicaid Other | Attending: Emergency Medicine | Admitting: Emergency Medicine

## 2018-03-12 ENCOUNTER — Other Ambulatory Visit: Payer: Self-pay

## 2018-03-12 ENCOUNTER — Encounter (HOSPITAL_BASED_OUTPATIENT_CLINIC_OR_DEPARTMENT_OTHER): Payer: Self-pay | Admitting: *Deleted

## 2018-03-12 DIAGNOSIS — R519 Headache, unspecified: Secondary | ICD-10-CM

## 2018-03-12 DIAGNOSIS — Z79899 Other long term (current) drug therapy: Secondary | ICD-10-CM | POA: Insufficient documentation

## 2018-03-12 DIAGNOSIS — R51 Headache: Secondary | ICD-10-CM | POA: Insufficient documentation

## 2018-03-12 DIAGNOSIS — G7100 Muscular dystrophy, unspecified: Secondary | ICD-10-CM | POA: Diagnosis not present

## 2018-03-12 MED ORDER — PROCHLORPERAZINE MALEATE 10 MG PO TABS: 10.0000 mg | ORAL_TABLET | Freq: Two times a day (BID) | ORAL | 0 refills | Status: DC | PRN

## 2018-03-12 MED ORDER — PROCHLORPERAZINE MALEATE 10 MG PO TABS
10.0000 mg | ORAL_TABLET | Freq: Once | ORAL | Status: AC
  Administered 2018-03-12: 10 mg via ORAL
  Filled 2018-03-12: qty 1

## 2018-03-12 MED ORDER — KETOROLAC TROMETHAMINE 30 MG/ML IJ SOLN
30.0000 mg | Freq: Once | INTRAMUSCULAR | Status: AC
  Administered 2018-03-12: 30 mg via INTRAMUSCULAR
  Filled 2018-03-12: qty 1

## 2018-03-12 NOTE — ED Notes (Signed)
ED Provider at bedside. 

## 2018-03-12 NOTE — ED Provider Notes (Signed)
[facility] EMERGENCY DEPARTMENT Provider Note   CSN: [id number] Arrival date & time: 03/12/18  1308     History   Chief Complaint Chief Complaint  Patient presents with  . Headache    HPI Elizabeth Hooper is a 27 y.o. female.  The history is provided by the patient and medical records.  Headache   This is a recurrent problem. The current episode started more than 2 days ago. The problem occurs constantly. The problem has not changed since onset.The headache is associated with nothing. The pain is located in the frontal region. The quality of the pain is described as dull. The pain is moderate. The pain does not radiate. Associated symptoms include nausea. Pertinent negatives include no anorexia, no fever, no orthopnea, no palpitations, no syncope, no shortness of breath and no vomiting. She has tried acetaminophen for the symptoms. The treatment provided no relief.    Past Medical History:  Diagnosis Date  . Muscular dystrophy (HCC)   . UTI (urinary tract infection) during pregnancy     Patient Active Problem List   Diagnosis Date Noted  . Normal labor 09/29/2013  . Supervision of high risk pregnancy in third trimester 07/22/2013  . 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 Previous cesarean section complicating pregnancy 07/22/2013  . GBS bacteriuria 07/16/2013    Past Surgical History:  Procedure Laterality Date  . CESAREAN SECTION    . CESAREAN SECTION N/A 09/27/2013   Procedure: CESAREAN SECTION WITH BILATERAL TUBAL LIGATION;  Surgeon: Lesly Dukes, MD;  Location: [facility];  Service: Obstetrics;  Laterality: N/A;  . TUBAL LIGATION       OB History    Gravida  2   Para  2   Term  2   Preterm  0   AB      Living  2     SAB      TAB      Ectopic      Multiple      Live Births  2            Home Medications    Prior to Admission medications   Medication Sig Start Date End Date Taking? Authorizing Provider  clindamycin (CLEOCIN) 300  MG capsule Take 1 capsule (300 mg total) by mouth 3 (three) times daily. 03/04/18   Azalia Bilis, MD    Family History Family History  Problem Relation Age of Onset  . Muscular dystrophy Father   . Hypertension Father     Social History Social History   Tobacco Use  . Smoking status: Never Smoker  . Smokeless tobacco: Never Used  Substance Use Topics  . Alcohol use: No  . Drug use: No     Allergies   Hydrocodone and Penicillins   Review of Systems Review of Systems  Constitutional: Negative for chills, diaphoresis, fatigue and fever.  HENT: Negative for congestion and rhinorrhea.   Eyes: Negative for pain and visual disturbance.  Respiratory: Negative for cough, chest tightness, shortness of breath and wheezing.   Cardiovascular: Negative for chest pain, palpitations, orthopnea and syncope.  Gastrointestinal: Positive for nausea. Negative for abdominal pain, anorexia and vomiting.  Genitourinary: Negative for dysuria and flank pain.  Musculoskeletal: Negative for back pain, neck pain and neck stiffness.  Neurological: Positive for headaches. Negative for dizziness, seizures, speech difficulty, weakness, light-headedness and numbness.  Hematological: Negative for adenopathy.  Psychiatric/Behavioral: Negative for agitation.  All other systems reviewed and are negative.    Physical Exam  Updated Vital Signs BP 108/73 (BP Location: Left Arm)   Pulse 75   Temp 98.6 F (37 C) (Oral)   Resp 14   Ht 5\' 1"  (1.549 m)   Wt 88.5 kg   LMP 02/25/2018   SpO2 99%   BMI 36.84 kg/m   Physical Exam  Constitutional: She is oriented to person, place, and time. She appears well-developed and well-nourished.  Non-toxic appearance. She does not appear ill. No distress.  HENT:  Head: Normocephalic and atraumatic.  Mouth/Throat: Oropharynx is clear and moist. No oropharyngeal exudate.  Eyes: Pupils are equal, round, and reactive to light. Conjunctivae and EOM are normal.  Neck:  Neck supple.  Cardiovascular: Normal rate and regular rhythm.  No murmur heard. Pulmonary/Chest: Effort normal and breath sounds normal. No stridor. No respiratory distress. She has no wheezes. She has no rales. She exhibits no tenderness.  Abdominal: Soft. There is no tenderness.  Musculoskeletal: She exhibits no edema or tenderness.  Neurological: She is alert and oriented to person, place, and time. She is not disoriented. She displays no tremor and normal reflexes. No cranial nerve deficit or sensory deficit. She exhibits normal muscle tone. Coordination and gait normal. GCS eye subscore is 4. GCS verbal subscore is 5. GCS motor subscore is 6.  Skin: Skin is warm and dry. Capillary refill takes less than 2 seconds. No rash noted. She is not diaphoretic. No erythema.  Psychiatric: She has a normal mood and affect. Her mood appears not anxious.  Nursing note and vitals reviewed.    ED Treatments / Results  Labs (all labs ordered are listed, but only abnormal results are displayed) Labs Reviewed - No data to display  EKG None  Radiology No results found.  Procedures Procedures (including critical care time)  Medications Ordered in ED Medications  ketorolac (TORADOL) 30 MG/ML injection 30 mg (30 mg Intramuscular Given 03/12/18 1036)  prochlorperazine (COMPAZINE) tablet 10 mg (10 mg Oral Given 03/12/18 1036)     Initial Impression / Assessment and Plan / ED Course  I have reviewed the triage vital signs and the nursing notes.  Pertinent labs & imaging results that were available during my care of the patient were reviewed by me and considered in my medical decision making (see chart for details).     Elizabeth Hooper is a 27 y.o. female with a past medical history significant for headaches who presents with headache.  Patient reports that she has a headache for the last week that has been moderate to severe.  She reports that she has had some nausea but no vomiting.  She says  that she is taken Tylenol and Motrin at home without significant relief.  She reports that she has had no vision changes, neurologic deficits, and denies recent trauma.  She denies fevers, chills, neck pain, or neck stiffness.  She denies any recent medication changes.  She denies any other complaints.  She reports she just completed her menstrual cycle several days ago.   On exam, no focal neurologic deficits were seen.  Normal finger-nose-finger testing sensation and strength in all extremities.  Pupils are reactive bilaterally.  Patient had normal gait.  Lungs clear chest nontender.  Normal neck range of motion.  Given history of headaches that feel similar, suspect a migrainous type headache.  She reports that last time she got a Toradol shot and an oral headache cocktail which resolved her symptoms.  She requested this be tried again.  Next  Given her reassuring  exam, low suspicion for intracranial hemorrhage or other abnormality.  With her lack of neurologic deficits and normal gait, I do not feel she needs any other imaging performed.  After patient was given Toradol and Compazine, patient reports her headache is nearly resolved.  She would like to go home.    Patient encouraged to stay hydrated and given prescription for Compazine to go home.  Patient will follow-up with her PCP to discuss further headache management strategies.  Patient understood return precautions for any new or worsened symptoms.  Patient no other questions or concerns and was discharged in good condition with resolved symptoms.   Final Clinical Impressions(s) / ED Diagnoses   Final diagnoses:  Acute nonintractable headache, unspecified headache type    ED Discharge Orders         Ordered    prochlorperazine (COMPAZINE) 10 MG tablet  2 times daily PRN     03/12/18 1122          Clinical Impression: 1. Acute nonintractable headache, unspecified headache type     Disposition: Discharge  Condition:  Good  I have discussed the results, Dx and Tx plan with the pt(& family if present). He/she/they expressed understanding and agree(s) with the plan. Discharge instructions discussed at great length. Strict return precautions discussed and pt &/or family have verbalized understanding of the instructions. No further questions at time of discharge.    New Prescriptions   PROCHLORPERAZINE (COMPAZINE) 10 MG TABLET    Take 1 tablet (10 mg total) by mouth 2 (two) times daily as needed for nausea or vomiting.    Follow Up: [facility] [address] [place] [address] [phone] Schedule an appointment as soon as possible for a visit    [facility] [place] [facility] [address] [id number] [place] [address] [phone]       Tegeler, Canary Brim, MD 03/12/18 [phone]

## 2018-03-12 NOTE — ED Triage Notes (Signed)
Pt amb to triage with quick steady gait in nad. Pt reports headache x 1 week. Pt texting on cell phone during triage in nad. Denies fevers or any other c/o.

## 2018-03-12 NOTE — Discharge Instructions (Signed)
Your history and exam today are consistent with a migrainous type headache.  As your symptoms improved with the medications and you had no focal neurologic deficits, we did not feel the need to get any blood work or imaging today.  Since your symptoms feel much better, we feel you are safe for discharge home.  Please maintain hydration and follow-up with a primary doctor.  If any symptoms change or worsen, please return to the nearest emergency department.

## 2018-03-18 ENCOUNTER — Encounter (HOSPITAL_BASED_OUTPATIENT_CLINIC_OR_DEPARTMENT_OTHER): Payer: Self-pay

## 2018-03-18 ENCOUNTER — Other Ambulatory Visit: Payer: Self-pay

## 2018-03-18 ENCOUNTER — Emergency Department (HOSPITAL_BASED_OUTPATIENT_CLINIC_OR_DEPARTMENT_OTHER)
Admission: EM | Admit: 2018-03-18 | Discharge: 2018-03-19 | Disposition: A | Discharge status: Home / Self Care | Payer: Medicaid Other | Attending: Emergency Medicine | Admitting: Emergency Medicine

## 2018-03-18 ENCOUNTER — Emergency Department (HOSPITAL_BASED_OUTPATIENT_CLINIC_OR_DEPARTMENT_OTHER): Payer: Medicaid Other

## 2018-03-18 DIAGNOSIS — M25552 Pain in left hip: Secondary | ICD-10-CM | POA: Insufficient documentation

## 2018-03-18 LAB — PREGNANCY, URINE: Preg Test, Ur: NEGATIVE

## 2018-03-18 MED ORDER — KETOROLAC TROMETHAMINE 30 MG/ML IJ SOLN: 30.0000 mg | Freq: Once | INTRAMUSCULAR | Status: DC

## 2018-03-18 NOTE — ED Provider Notes (Signed)
[facility] EMERGENCY DEPARTMENT Provider Note   CSN: [id number] Arrival date & time: 03/18/18  2009     History   Chief Complaint Chief Complaint  Patient presents with  . Hip Pain    HPI SERENE KOPF is a 27 y.o. female with history of muscular dystrophy who presents with a 4-day history of left hip pain.  It is worse with movement.  She describes it as a sharp, soreness.  She denies any numbness or tingling in her leg or saddle anesthesia.  She denies any urinary symptoms.  She has taken ibuprofen at home without relief.  She denies any injury or heavy lifting.  She denies any back pain.  She denies fevers.  HPI  Past Medical History:  Diagnosis Date  . Muscular dystrophy (HCC)   . UTI (urinary tract infection) during pregnancy     Patient Active Problem List   Diagnosis Date Noted  . Normal labor 09/29/2013  . Supervision of high risk pregnancy in third trimester 07/22/2013  . 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 Previous cesarean section complicating pregnancy 07/22/2013  . GBS bacteriuria 07/16/2013    Past Surgical History:  Procedure Laterality Date  . CESAREAN SECTION    . CESAREAN SECTION N/A 09/27/2013   Procedure: CESAREAN SECTION WITH BILATERAL TUBAL LIGATION;  Surgeon: Lesly Dukes, MD;  Location: [facility];  Service: Obstetrics;  Laterality: N/A;  . TUBAL LIGATION       OB History    Gravida  2   Para  2   Term  2   Preterm  0   AB      Living  2     SAB      TAB      Ectopic      Multiple      Live Births  2            Home Medications    Prior to Admission medications   Medication Sig Start Date End Date Taking? Authorizing Provider  clindamycin (CLEOCIN) 300 MG capsule Take 1 capsule (300 mg total) by mouth 3 (three) times daily. 03/04/18   Azalia Bilis, MD  ibuprofen (ADVIL,MOTRIN) 600 MG tablet Take 1 tablet (600 mg total) by mouth every 6 (six) hours as needed. 03/18/18   Cherina Dhillon, Waylan Boga, PA-C    oxyCODONE-acetaminophen (PERCOCET/ROXICET) 5-325 MG tablet Take 1-2 tablets by mouth every 6 (six) hours as needed for severe pain. 03/18/18   Neda Willenbring, Waylan Boga, PA-C  prochlorperazine (COMPAZINE) 10 MG tablet Take 1 tablet (10 mg total) by mouth 2 (two) times daily as needed for nausea or vomiting. 03/12/18   Tegeler, Canary Brim, MD    Family History Family History  Problem Relation Age of Onset  . Muscular dystrophy Father   . Hypertension Father     Social History Social History   Tobacco Use  . Smoking status: Never Smoker  . Smokeless tobacco: Never Used  Substance Use Topics  . Alcohol use: No  . Drug use: No     Allergies   Hydrocodone and Penicillins   Review of Systems Review of Systems  Constitutional: Negative for fever.  Musculoskeletal: Positive for arthralgias. Negative for back pain.  Neurological: Negative for numbness.     Physical Exam Updated Vital Signs BP 121/90 (BP Location: Left Arm)   Pulse 79   Temp 98.6 F (37 C) (Oral)   Resp 16   Ht 5\' 1"  (1.549 m)   Wt 90.3 kg  LMP 02/25/2018   SpO2 99%   BMI 37.60 kg/m   Physical Exam  Constitutional: She appears well-developed and well-nourished. No distress.  HENT:  Head: Normocephalic and atraumatic.  Mouth/Throat: Oropharynx is clear and moist. No oropharyngeal exudate.  Eyes: Pupils are equal, round, and reactive to light. Conjunctivae are normal. Right eye exhibits no discharge. Left eye exhibits no discharge. No scleral icterus.  Neck: Normal range of motion. Neck supple. No thyromegaly present.  Cardiovascular: Normal rate, regular rhythm, normal heart sounds and intact distal pulses. Exam reveals no gallop and no friction rub.  No murmur heard. Pulmonary/Chest: Effort normal and breath sounds normal. No stridor. No respiratory distress. She has no wheezes. She has no rales.  Abdominal: Soft. Bowel sounds are normal. She exhibits no distension. There is no tenderness. There is no  rebound and no guarding.  Musculoskeletal: She exhibits no edema.  Tenderness on palpation of the posterior and lateral hip, no tenderness over the spine  Lymphadenopathy:    She has no cervical adenopathy.  Neurological: She is alert. Coordination normal.  5/5 strength to bilateral lower extremities and normal sensation  Skin: Skin is warm and dry. No rash noted. She is not diaphoretic. No pallor.  Psychiatric: She has a normal mood and affect.  Nursing note and vitals reviewed.    ED Treatments / Results  Labs (all labs ordered are listed, but only abnormal results are displayed) Labs Reviewed  PREGNANCY, URINE    EKG None  Radiology Dg Hip Unilat W Or Wo Pelvis 2-3 Views Left  Result Date: 03/18/2018 CLINICAL DATA:  Left hip pain for 4 days. 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 Bony overgrowth of the left upper acetabulum extending laterally could be from a prior attempted correction or from proliferative spurring. This appears mostly fragmented with cortication. There is some subcortical cyst formation laterally in the left acetabulum which is chronic. IMPRESSION: 1. 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 2. The left hip demonstrates fragmented lateral prominent spurring from the upper acetabulum which appears chronic and corticated, not substantially different from 10/06/2016, with underlying subcortical cyst formation and sclerosis in the acetabulum. Electronically Signed   By: Gaylyn Rong M.D.   On: 03/18/2018 23:28    Procedures Procedures (including critical care time)  Medications Ordered in ED Medications - No data to display   Initial Impression / Assessment and Plan / ED Course  I have reviewed the triage vital signs and the nursing notes.  Pertinent labs & imaging results that were  available during my care of the patient were reviewed by me and considered in my medical decision making (see chart for details).     Patient presenting with 4-day history of left hip pain.  X-ray shows fragmented lateral prominent spurring from the upper acetabulum which appears chronic and corticated with underlying subcortical cyst formation and sclerosis in the acetabulum.  Also shows dysplastic right hip with over 50% lateral covering.  Patient denies right hip pain or problems with the right hip.  Will refer to orthopedics for further evaluation.  I confirmed with radiologist, Dr. Hinton Lovely, who confirmed all findings were chronic and seen on prior imaging.  Patient will be treated with supportive management including ice, heat, NSAIDs, and short course of Percocet for pain control.  I reviewed the [place] narcotic database and found  no discrepancies.  Return precautions discussed.  Patient understands and agrees with plan.  Patient vitals stable throughout ED course and discharged in satisfactory condition. I discussed patient case with Dr. Madilyn Hook who guided the patient's management and agrees with plan.   Final Clinical Impressions(s) / ED Diagnoses   Final diagnoses:  Left hip pain    ED Discharge Orders         Ordered    ibuprofen (ADVIL,MOTRIN) 600 MG tablet  Every 6 hours PRN     03/19/18 0000    oxyCODONE-acetaminophen (PERCOCET/ROXICET) 5-325 MG tablet  Every 6 hours PRN     03/19/18 0000           Emi Holes, PA-C 03/19/18 1313    Tilden Fossa, MD 03/20/18 1536

## 2018-03-18 NOTE — ED Triage Notes (Signed)
Pt c/o pain to left hip/buttock area x 4 days-denies injury-NAD-steady gait

## 2018-03-18 NOTE — ED Notes (Signed)
ED Provider at bedside. 

## 2018-03-19 MED ORDER — IBUPROFEN 600 MG PO TABS: 600.0000 mg | ORAL_TABLET | Freq: Four times a day (QID) | ORAL | 0 refills | Status: DC | PRN

## 2018-03-19 MED ORDER — OXYCODONE-ACETAMINOPHEN 5-325 MG PO TABS: 1.0000 | ORAL_TABLET | Freq: Four times a day (QID) | ORAL | 0 refills | Status: DC | PRN

## 2018-03-19 NOTE — Discharge Instructions (Addendum)
Take ibuprofen every 6 hours as needed for pain.  For severe pain, take 1-2 Percocet every 6 hours per is on, 20 minutes off.  Please follow-up with the orthopedic doctor below, Dr. Ave Filter, for further evaluation and treatment of your hip pain.  Please return to emergency department if you develop any new or worsening symptoms.  Do not drink alcohol, drive, operate machinery or participate in any other potentially dangerous activities while taking opiate pain medication as it may make you sleepy. Do not take this medication with any other sedating medications, either prescription or over-the-counter. If you were prescribed Percocet or Vicodin, do not take these with acetaminophen (Tylenol) as it is already contained within these medications and overdose of Tylenol is dangerous.   This medication is an opiate (or narcotic) pain medication and can be habit forming.  Use it as little as possible to achieve adequate pain control.  Do not use or use it with extreme caution if you have a history of opiate abuse or dependence. This medication is intended for your use only - do not give any to anyone else and keep it in a secure place where nobody else, especially children, have access to it. It will also cause or worsen constipation, so you may want to consider taking an over-the-counter stool softener while you are taking this medication.

## 2018-04-08 ENCOUNTER — Other Ambulatory Visit: Payer: Self-pay

## 2018-04-08 ENCOUNTER — Emergency Department (HOSPITAL_BASED_OUTPATIENT_CLINIC_OR_DEPARTMENT_OTHER)
Admission: EM | Admit: 2018-04-08 | Discharge: 2018-04-08 | Disposition: A | Discharge status: Home / Self Care | Payer: Medicaid Other | Attending: Emergency Medicine | Admitting: Emergency Medicine

## 2018-04-08 ENCOUNTER — Encounter (HOSPITAL_BASED_OUTPATIENT_CLINIC_OR_DEPARTMENT_OTHER): Payer: Self-pay

## 2018-04-08 DIAGNOSIS — Z79899 Other long term (current) drug therapy: Secondary | ICD-10-CM | POA: Diagnosis not present

## 2018-04-08 DIAGNOSIS — R51 Headache: Secondary | ICD-10-CM | POA: Diagnosis present

## 2018-04-08 DIAGNOSIS — G7100 Muscular dystrophy, unspecified: Secondary | ICD-10-CM | POA: Insufficient documentation

## 2018-04-08 DIAGNOSIS — J019 Acute sinusitis, unspecified: Secondary | ICD-10-CM | POA: Insufficient documentation

## 2018-04-08 DIAGNOSIS — R519 Headache, unspecified: Secondary | ICD-10-CM

## 2018-04-08 DIAGNOSIS — J329 Chronic sinusitis, unspecified: Secondary | ICD-10-CM

## 2018-04-08 MED ORDER — CETIRIZINE HCL 10 MG PO TABS: 10.0000 mg | ORAL_TABLET | Freq: Every day | ORAL | 0 refills | Status: DC

## 2018-04-08 MED ORDER — NAPROXEN 500 MG PO TABS: 500.0000 mg | ORAL_TABLET | Freq: Two times a day (BID) | ORAL | 0 refills | Status: DC

## 2018-04-08 MED ORDER — DIPHENHYDRAMINE HCL 25 MG PO CAPS
25.0000 mg | ORAL_CAPSULE | Freq: Once | ORAL | Status: DC
  Filled 2018-04-08: qty 1

## 2018-04-08 MED ORDER — FLUTICASONE PROPIONATE 50 MCG/ACT NA SUSP: 2.0000 | Freq: Every day | NASAL | 0 refills | Status: DC

## 2018-04-08 MED ORDER — PROCHLORPERAZINE MALEATE 10 MG PO TABS
10.0000 mg | ORAL_TABLET | Freq: Once | ORAL | Status: AC
  Administered 2018-04-08: 10 mg via ORAL
  Filled 2018-04-08: qty 1

## 2018-04-08 MED ORDER — KETOROLAC TROMETHAMINE 30 MG/ML IJ SOLN
30.0000 mg | Freq: Once | INTRAMUSCULAR | Status: AC
  Administered 2018-04-08: 30 mg via INTRAMUSCULAR
  Filled 2018-04-08: qty 1

## 2018-04-08 MED FILL — CETIRIZINE HCL 10 MG TABLET: 10 | 100 days supply | Qty: 100 | Fill #0

## 2018-04-08 MED FILL — FLUTICASONE PROP 50 MCG SPR: 50 | 30 days supply | Qty: 16 | Fill #0

## 2018-04-08 NOTE — ED Triage Notes (Signed)
C/o HA x 1 week-NAD-steady gait-pt with multiple ED visits and seen by PCP for same-NAD-steady gait

## 2018-04-08 NOTE — ED Provider Notes (Signed)
[facility] EMERGENCY DEPARTMENT Provider Note   CSN: [id number] Arrival date & time: 04/08/18  1139     History   Chief Complaint Chief Complaint  Patient presents with  . Headache    HPI Elizabeth Hooper is a 27 y.o. female.  Elizabeth Hooper is a 27 y.o. Female with a history of muscular dystrophy and frequent headaches, presents to the emergency department for evaluation of headache.  She has been seen for this numerous times department and saw her primary care doctor regarding this yesterday.  She reports a frontal headache, and that her scalp feels tender to palpation.  This is a similar headache she is presented with several times over the past few weeks.  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  She has been taking Tylenol without improvement for her headaches.  She denies associated vision changes, facial asymmetry, changes in speech, numbness, weakness, tingling or dizziness, nausea or vomiting.  She does have some light sensitivity.  Patient wonders if she could have a sinus infection making these headaches worse because she has had some nasal congestion and tenderness over her sinuses, no fevers or chills, no neck pain or stiffness.     Past Medical History:  Diagnosis Date  . Muscular dystrophy (HCC)   . UTI (urinary tract infection) during pregnancy     Patient Active Problem List   Diagnosis Date Noted  . Normal labor 09/29/2013  . Supervision of high risk pregnancy in third trimester 07/22/2013  . 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 Previous cesarean section complicating pregnancy 07/22/2013  . GBS bacteriuria 07/16/2013    Past Surgical History:  Procedure Laterality Date  . CESAREAN SECTION    . CESAREAN SECTION N/A 09/27/2013   Procedure: CESAREAN SECTION  WITH BILATERAL TUBAL LIGATION;  Surgeon: Lesly Dukes, MD;  Location: [facility];  Service: Obstetrics;  Laterality: N/A;  . TUBAL LIGATION       OB History    Gravida  2   Para  2   Term  2   Preterm  0   AB      Living  2     SAB      TAB      Ectopic      Multiple      Live Births  2            Home Medications    Prior to Admission medications   Medication Sig Start Date End Date Taking? Authorizing Provider  cetirizine (ZYRTEC ALLERGY) 10 MG tablet Take 1 tablet (10 mg total) by mouth daily. 04/08/18   Dartha Lodge, PA-C  clindamycin (CLEOCIN) 300 MG capsule Take 1 capsule (300 mg total) by mouth 3 (three) times daily. 03/04/18   Azalia Bilis, MD  fluticasone (FLONASE) 50 MCG/ACT nasal spray Place 2 sprays into both nostrils daily. 04/08/18   Dartha Lodge, PA-C  ibuprofen (ADVIL,MOTRIN) 600 MG tablet Take 1 tablet (600 mg total) by mouth every 6 (six) hours as needed. 03/18/18   Law, Waylan Boga, PA-C  naproxen (NAPROSYN) 500 MG tablet Take 1 tablet (500 mg total) by mouth 2 (two) times daily. 04/08/18   Dartha Lodge, PA-C  oxyCODONE-acetaminophen (PERCOCET/ROXICET) 5-325 MG tablet Take 1-2 tablets by mouth every 6 (six) hours  as needed for severe pain. 03/18/18   Law, Waylan Boga, PA-C  prochlorperazine (COMPAZINE) 10 MG tablet Take 1 tablet (10 mg total) by mouth 2 (two) times daily as needed for nausea or vomiting. 03/12/18   Tegeler, Canary Brim, MD    Family History Family History  Problem Relation Age of Onset  . Muscular dystrophy Father   . Hypertension Father     Social History Social History   Tobacco Use  . Smoking status: Never Smoker  . Smokeless tobacco: Never Used  Substance Use Topics  . Alcohol use: No  . Drug use: No     Allergies   Hydrocodone and Penicillins   Review of Systems Review of Systems  Constitutional: Negative for chills and fever.  HENT: Positive for congestion, rhinorrhea, sinus pressure and sinus pain.    Eyes: Negative for pain and visual disturbance.  Respiratory: Negative for cough and shortness of breath.   Cardiovascular: Negative for chest pain.  Gastrointestinal: Negative for abdominal pain, nausea and vomiting.  Genitourinary: Negative for dysuria and frequency.  Musculoskeletal: Negative for arthralgias, myalgias, neck pain and neck stiffness.  Skin: Negative for color change and rash.  Neurological: Positive for headaches. Negative for dizziness, tremors, seizures, syncope, speech difficulty, weakness, light-headedness and numbness.     Physical Exam Updated Vital Signs BP 120/78 (BP Location: Left Arm)   Pulse 80   Temp 98.9 F (37.2 C) (Oral)   Resp 18   Ht 5\' 1"  (1.549 m)   Wt 89.8 kg   LMP 03/25/2018   SpO2 100%   BMI 37.41 kg/m   Physical Exam  Constitutional: She is oriented to person, place, and time. She appears well-developed and well-nourished. No distress.  HENT:  Head: Normocephalic and atraumatic.  Mouth/Throat: Oropharynx is clear and moist.  TMs clear with good landmarks, moderate nasal mucosa edema with clear rhinorrhea, there is mild tenderness over the frontal and maxillary sinuses.  Posterior oropharynx clear and moist, with no erythema, edema or exudates  Eyes: Pupils are equal, round, and reactive to light. EOM are normal. Right eye exhibits no discharge. Left eye exhibits no discharge.  Neck: Neck supple.  No rigidity  Cardiovascular: Normal rate, regular rhythm, normal heart sounds and intact distal pulses.  Pulmonary/Chest: Effort normal and breath sounds normal. No respiratory distress. She has no wheezes. She has no rales.  Abdominal: Soft. Bowel sounds are normal. She exhibits no distension and no mass. There is no tenderness. There is no guarding.  Musculoskeletal: She exhibits no edema or deformity.  Neurological: She is alert and oriented to person, place, and time. Coordination normal.  Speech is clear, able to follow commands CN  III-XII intact Normal strength in upper and lower extremities bilaterally including dorsiflexion and plantar flexion, strong and equal grip strength Sensation normal to light and sharp touch Moves extremities without ataxia, coordination intact Normal finger to nose and rapid alternating movements No pronator drift  Skin: Skin is warm and dry. Capillary refill takes less than 2 seconds. She is not diaphoretic.  Nursing note and vitals reviewed.    ED Treatments / Results  Labs (all labs ordered are listed, but only abnormal results are displayed) Labs Reviewed - No data to display  EKG None  Radiology No results found.  Procedures Procedures (including critical care time)  Medications Ordered in ED Medications  ketorolac (TORADOL) 30 MG/ML injection 30 mg (30 mg Intramuscular Given 04/08/18 1254)  prochlorperazine (COMPAZINE) tablet 10 mg (10 mg Oral Given  04/08/18 1254)     Initial Impression / Assessment and Plan / ED Course  I have reviewed the triage vital signs and the nursing notes.  Pertinent labs & imaging results that were available during my care of the patient were reviewed by me and considered in my medical decision making (see chart for details).  Pt HA treated and improved while in ED.  Presentation is like pts typical HA and non concerning for [facility], ICH, Meningitis, or temporal arteritis. Pt is afebrile with no focal neuro deficits, nuchal rigidity, or change in vision.  Patient does wonder if sinus congestion could be worsening her symptoms she has had congestion for the past 4 to 5 days, does not have any purulent drainage, afebrile, minimal mucosal edema and sinus tenderness noted on exam.  I have discussed the patient that she could potentially have a viral sinus infection encourage symptomatic treatment with Flonase, Zyrtec and sinus rinses.  Pt is to follow up with PCP to discuss prophylactic medication. Pt verbalizes understanding and is agreeable with plan  to dc.    Final Clinical Impressions(s) / ED Diagnoses   Final diagnoses:  Acute nonintractable headache, unspecified headache type  Sinusitis, unspecified chronicity, unspecified location    ED Discharge Orders         Ordered    cetirizine (ZYRTEC ALLERGY) 10 MG tablet  Daily     04/08/18 1238    fluticasone (FLONASE) 50 MCG/ACT nasal spray  Daily     04/08/18 1238    naproxen (NAPROSYN) 500 MG tablet  2 times daily     04/08/18 1238           Legrand Rams 04/08/18 Farley Ly, MD 04/11/18 [id number]

## 2018-04-08 NOTE — ED Notes (Signed)
Pt/family verbalized understanding of discharge instructions.   

## 2018-04-08 NOTE — Discharge Instructions (Signed)
Please take naproxen twice daily to help with headaches until you are able to get Topamax filled.  You can also use Flonase, Zyrtec and the sinus rinses that we talked about to help relieve any sinus congestion and pressure as that could certainly cause worsening headaches make sure you are drinking plenty of water dehydration can cause headaches to recur as well.  Follow-up with your primary care doctor as we discussed.  Return to the emergency department if you develop severe headache, vision changes, persistent vomiting, dizziness or weakness, numbness or any other new or concerning symptoms.

## 2018-06-04 ENCOUNTER — Emergency Department (HOSPITAL_BASED_OUTPATIENT_CLINIC_OR_DEPARTMENT_OTHER)
Admission: EM | Admit: 2018-06-04 | Discharge: 2018-06-04 | Disposition: A | Discharge status: Home / Self Care | Payer: Medicaid Other | Attending: Emergency Medicine | Admitting: Emergency Medicine

## 2018-06-04 ENCOUNTER — Other Ambulatory Visit: Payer: Self-pay

## 2018-06-04 ENCOUNTER — Emergency Department (HOSPITAL_BASED_OUTPATIENT_CLINIC_OR_DEPARTMENT_OTHER): Payer: Medicaid Other

## 2018-06-04 ENCOUNTER — Encounter (HOSPITAL_BASED_OUTPATIENT_CLINIC_OR_DEPARTMENT_OTHER): Payer: Self-pay | Admitting: *Deleted

## 2018-06-04 DIAGNOSIS — J40 Bronchitis, not specified as acute or chronic: Secondary | ICD-10-CM

## 2018-06-04 DIAGNOSIS — Z79899 Other long term (current) drug therapy: Secondary | ICD-10-CM | POA: Diagnosis not present

## 2018-06-04 DIAGNOSIS — R509 Fever, unspecified: Secondary | ICD-10-CM | POA: Diagnosis present

## 2018-06-04 LAB — URINALYSIS, MICROSCOPIC (REFLEX): WBC, UA: NONE SEEN WBC/hpf (ref 0–5)

## 2018-06-04 LAB — URINALYSIS, ROUTINE W REFLEX MICROSCOPIC
Bilirubin Urine: NEGATIVE
Glucose, UA: NEGATIVE mg/dL
Ketones, ur: NEGATIVE mg/dL
Leukocytes, UA: NEGATIVE
Nitrite: NEGATIVE
Protein, ur: NEGATIVE mg/dL
Specific Gravity, Urine: 1.02 (ref 1.005–1.030)
pH: 6 (ref 5.0–8.0)

## 2018-06-04 LAB — PREGNANCY, URINE: Preg Test, Ur: NEGATIVE

## 2018-06-04 MED ORDER — SODIUM CHLORIDE 0.9 % IV BOLUS
1000.0000 mL | Freq: Once | INTRAVENOUS | Status: AC
  Administered 2018-06-04: 1000 mL via INTRAVENOUS

## 2018-06-04 MED ORDER — BENZONATATE 100 MG PO CAPS: 100.0000 mg | ORAL_CAPSULE | Freq: Three times a day (TID) | ORAL | 0 refills | Status: DC | PRN

## 2018-06-04 MED ORDER — IBUPROFEN 400 MG PO TABS
600.0000 mg | ORAL_TABLET | Freq: Once | ORAL | Status: AC
  Administered 2018-06-04: 600 mg via ORAL
  Filled 2018-06-04: qty 1

## 2018-06-04 MED ORDER — FLUTICASONE PROPIONATE 50 MCG/ACT NA SUSP: 2.0000 | Freq: Every day | NASAL | 0 refills | Status: DC

## 2018-06-04 MED ORDER — ACETAMINOPHEN 325 MG PO TABS
650.0000 mg | ORAL_TABLET | Freq: Once | ORAL | Status: AC
  Administered 2018-06-04: 650 mg via ORAL
  Filled 2018-06-04: qty 2

## 2018-06-04 NOTE — ED Notes (Signed)
Given PO fluids per ED PA

## 2018-06-04 NOTE — Discharge Instructions (Signed)
Drink plenty of water and get plenty of rest.  Take flonase to decrease nasal congestion.  Tessalon as needed for cough.  You can take over-the-counter allergy medicines and other and flu medicines for nasal congestion and scratchy throat. Alternate 600 mg of ibuprofen and [phone] mg of Tylenol every 3 hours as needed for pain. Do not exceed 4000 mg of Tylenol daily.   Followup with your primary care doctor in 5-7 days for recheck of ongoing symptoms. Return to emergency department for emergent changing or worsening of symptoms such as throat tightness, facial swelling, fever not controlled by ibuprofen or Tylenol,difficulty breathing, or chest pain.

## 2018-06-04 NOTE — ED Notes (Signed)
Oxygen saturation between 96-100% while ambulating

## 2018-06-04 NOTE — ED Triage Notes (Signed)
Cough, runny nose and fever. Increased urination.

## 2018-06-04 NOTE — ED Provider Notes (Signed)
[facility] EMERGENCY DEPARTMENT Provider Note   CSN: [id number] Arrival date & time: 06/04/18  1720     History   Chief Complaint Chief Complaint  Patient presents with  . Cough    HPI Elizabeth Hooper is a 27 y.o. female with history of muscular dystrophy and Charcot Hilda Lias disease presents for evaluation of acute onset, progressively worsening fever, nasal congestion, cough, and shortness of breath since yesterday.  She notes cough productive of yellow-green sputum.  She feels short of breath and lightheaded specifically with ambulation.  Denies chest pain, abdominal pain.  Has had a few episodes of nonbloody nonbilious posttussive emesis.  Notes urinary frequency but states that she has been drinking a lot of water.  Denies urgency, dysuria, or hematuria.  Has been taking Motrin with some relief.  Notes that both of her children were recently treated for flulike symptoms last week.  The history is provided by the patient.    Past Medical History:  Diagnosis Date  . Muscular dystrophy (HCC)   . UTI (urinary tract infection) during pregnancy     Patient Active Problem List   Diagnosis Date Noted  . Normal labor 09/29/2013  . Supervision of high risk pregnancy in third trimester 07/22/2013  . 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 Previous cesarean section complicating pregnancy 07/22/2013  . GBS bacteriuria 07/16/2013    Past Surgical History:  Procedure Laterality Date  . CESAREAN SECTION    . CESAREAN SECTION N/A 09/27/2013   Procedure: CESAREAN SECTION WITH BILATERAL TUBAL LIGATION;  Surgeon: Lesly Dukes, MD;  Location: [facility];  Service: Obstetrics;  Laterality: N/A;  . TUBAL LIGATION       OB History    Gravida  2   Para  2   Term  2   Preterm  0   AB      Living  2     SAB      TAB      Ectopic      Multiple      Live Births  2            Home Medications    Prior to Admission medications   Medication Sig Start Date End  Date Taking? Authorizing Provider  benzonatate (TESSALON) 100 MG capsule Take 1 capsule (100 mg total) by mouth 3 (three) times daily as needed for cough. 06/04/18   Luevenia Maxin, Lemont Sitzmann A, PA-C  cetirizine (ZYRTEC ALLERGY) 10 MG tablet Take 1 tablet (10 mg total) by mouth daily. 04/08/18   Dartha Lodge, PA-C  clindamycin (CLEOCIN) 300 MG capsule Take 1 capsule (300 mg total) by mouth 3 (three) times daily. 03/04/18   Azalia Bilis, MD  fluticasone (FLONASE) 50 MCG/ACT nasal spray Place 2 sprays into both nostrils daily. 06/04/18   Kais Monje A, PA-C  ibuprofen (ADVIL,MOTRIN) 600 MG tablet Take 1 tablet (600 mg total) by mouth every 6 (six) hours as needed. 03/18/18   Law, Waylan Boga, PA-C  naproxen (NAPROSYN) 500 MG tablet Take 1 tablet (500 mg total) by mouth 2 (two) times daily. 04/08/18   Dartha Lodge, PA-C  oxyCODONE-acetaminophen (PERCOCET/ROXICET) 5-325 MG tablet Take 1-2 tablets by mouth every 6 (six) hours as needed for severe pain. 03/18/18   Law, Waylan Boga, PA-C  prochlorperazine (COMPAZINE) 10 MG tablet Take 1 tablet (10 mg total) by mouth 2 (two) times daily as needed for nausea or vomiting. 03/12/18   Tegeler, Canary Brim, MD    Family History  Family History  Problem Relation Age of Onset  . Muscular dystrophy Father   . Hypertension Father     Social History Social History   Tobacco Use  . Smoking status: Never Smoker  . Smokeless tobacco: Never Used  Substance Use Topics  . Alcohol use: No  . Drug use: No     Allergies   Hydrocodone and Penicillins   Review of Systems Review of Systems  Constitutional: Positive for chills and fever.  HENT: Positive for congestion. Negative for sore throat.   Respiratory: Positive for cough and shortness of breath.   Cardiovascular: Negative for chest pain.  Gastrointestinal: Negative for abdominal pain, nausea and vomiting (post-tussive emesis).  Genitourinary: Positive for frequency. Negative for dysuria, hematuria and urgency.    All other systems reviewed and are negative.    Physical Exam Updated Vital Signs BP (!) 126/92   Pulse 96   Temp 100 F (37.8 C) (Oral)   Resp 18   Ht 5\' 1"  (1.549 m)   Wt 88.5 kg   LMP 05/10/2018   SpO2 99%   BMI 36.84 kg/m   Physical Exam Vitals signs and nursing note reviewed.  Constitutional:      General: She is not in acute distress.    Appearance: She is well-developed.     Comments: Resting comfortably in bed  HENT:     Head: Normocephalic and atraumatic.     Jaw: No trismus.     Right Ear: External ear normal.     Left Ear: External ear normal.     Nose: Congestion present.     Mouth/Throat:     Mouth: Mucous membranes are moist.     Pharynx: Oropharynx is clear. Uvula midline. No pharyngeal swelling, oropharyngeal exudate, posterior oropharyngeal erythema or uvula swelling.     Tonsils: No tonsillar exudate or tonsillar abscesses.     Comments: Tolerating secretions without difficulty.  No abnormal phonation.  No trismus or sublingual abnormalities. Eyes:     General:        Right eye: No discharge.        Left eye: No discharge.     Conjunctiva/sclera: Conjunctivae normal.  Neck:     Musculoskeletal: Normal range of motion and neck supple. No neck rigidity.     Vascular: No JVD.     Trachea: No tracheal deviation.  Cardiovascular:     Rate and Rhythm: Tachycardia present.     Pulses: Normal pulses.  Pulmonary:     Effort: Pulmonary effort is normal.     Breath sounds: Normal breath sounds.  Chest:     Chest wall: No tenderness.  Abdominal:     General: Abdomen is flat. There is no distension.     Tenderness: There is no abdominal tenderness.  Lymphadenopathy:     Cervical: No cervical adenopathy.  Skin:    General: Skin is warm and dry.     Capillary Refill: Capillary refill takes less than 2 seconds.     Findings: No erythema.  Neurological:     Mental Status: She is alert.  Psychiatric:        Behavior: Behavior normal.      ED  Treatments / Results  Labs (all labs ordered are listed, but only abnormal results are displayed) Labs Reviewed  URINALYSIS, ROUTINE W REFLEX MICROSCOPIC - Abnormal; Notable for the following components:      Result Value   Hgb urine dipstick SMALL (*)    All other components within  normal limits  URINALYSIS, MICROSCOPIC (REFLEX) - Abnormal; Notable for the following components:   Bacteria, UA RARE (*)    All other components within normal limits  PREGNANCY, URINE    EKG None  Radiology Dg Chest 2 View  Result Date: 06/04/2018 CLINICAL DATA:  Cough and fever. EXAM: CHEST - 2 VIEW COMPARISON:  Chest CT and chest x-ray 04/18/2018 FINDINGS: The cardiac silhouette, mediastinal and hilar contours are normal. Mild peribronchial thickening could suggest bronchitis. No infiltrates, edema or effusions. The bony thorax is intact. IMPRESSION: Suspect mild bronchitis but no infiltrates or effusions. Electronically Signed   By: Rudie MeyerP.  Gallerani M.D.   On: 06/04/2018 18:48    Procedures Procedures (including critical care time)  Medications Ordered in ED Medications  acetaminophen (TYLENOL) tablet 650 mg (650 mg Oral Given 06/04/18 1736)  ibuprofen (ADVIL,MOTRIN) tablet 600 mg (600 mg Oral Given 06/04/18 1842)  sodium chloride 0.9 % bolus 1,000 mL (0 mLs Intravenous Stopped 06/04/18 2110)     Initial Impression / Assessment and Plan / ED Course  I have reviewed the triage vital signs and the nursing notes.  Pertinent labs & imaging results that were available during my care of the patient were reviewed by me and considered in my medical decision making (see chart for details).     Patient with flulike symptoms with multiple sick contacts.  She is febrile and tachycardic in the ED with significant improvement with antipyretics and IV fluids.  She appeared mildly dehydrated on initial presentation.  She is tolerating p.o. fluids without difficulty.  She was also complaining of some urinary  frequency but her UA does not show any evidence of UTI or nephrolithiasis and she has been increasing her p.o. intake which could explain her urinary frequency.  Chest x-ray without evidence of pneumonia or pleural effusion but does show bronchitis.  No evidence of meningitis, strep pharyngitis, PTA, retropharyngeal abscess.  On reevaluation patient resting comfortably no apparent distress.  Discussed that antibiotics are not indicated for viral infections, will give supportive treatment.  Recommend follow with PCP if symptoms persist.  Discussed strict ED return precautions.  Patient and significant other verbalized understanding of and agreement with plan and patient stable for discharge home at this time.  Final Clinical Impressions(s) / ED Diagnoses   Final diagnoses:  Bronchitis    ED Discharge Orders         Ordered    benzonatate (TESSALON) 100 MG capsule  3 times daily PRN     06/04/18 2026    fluticasone (FLONASE) 50 MCG/ACT nasal spray  Daily     06/04/18 2026           Jeanie SewerFawze, Boyde Grieco A, PA-C 06/05/18 1545    Raeford RazorKohut, Stephen, MD 06/05/18 1814

## 2018-06-19 ENCOUNTER — Emergency Department (HOSPITAL_BASED_OUTPATIENT_CLINIC_OR_DEPARTMENT_OTHER)
Admission: EM | Admit: 2018-06-19 | Discharge: 2018-06-19 | Disposition: A | Discharge status: Home / Self Care | Payer: Medicaid Other | Attending: Emergency Medicine | Admitting: Emergency Medicine

## 2018-06-19 ENCOUNTER — Other Ambulatory Visit: Payer: Self-pay

## 2018-06-19 ENCOUNTER — Encounter (HOSPITAL_BASED_OUTPATIENT_CLINIC_OR_DEPARTMENT_OTHER): Payer: Self-pay | Admitting: *Deleted

## 2018-06-19 DIAGNOSIS — Z79899 Other long term (current) drug therapy: Secondary | ICD-10-CM | POA: Diagnosis not present

## 2018-06-19 DIAGNOSIS — T7840XA Allergy, unspecified, initial encounter: Secondary | ICD-10-CM | POA: Diagnosis not present

## 2018-06-19 DIAGNOSIS — L299 Pruritus, unspecified: Secondary | ICD-10-CM | POA: Diagnosis present

## 2018-06-19 DIAGNOSIS — G7100 Muscular dystrophy, unspecified: Secondary | ICD-10-CM | POA: Diagnosis not present

## 2018-06-19 MED ORDER — DIPHENHYDRAMINE HCL 25 MG PO CAPS
25.0000 mg | ORAL_CAPSULE | Freq: Once | ORAL | Status: AC
  Administered 2018-06-19: 25 mg via ORAL
  Filled 2018-06-19: qty 1

## 2018-06-19 MED ORDER — PREDNISONE 20 MG PO TABS: 40.0000 mg | ORAL_TABLET | Freq: Every day | ORAL | 0 refills | Status: AC

## 2018-06-19 MED ORDER — DIPHENHYDRAMINE HCL 25 MG PO TABS: 25.0000 mg | ORAL_TABLET | Freq: Three times a day (TID) | ORAL | 0 refills | Status: DC | PRN

## 2018-06-19 MED ORDER — FAMOTIDINE 20 MG PO TABS: 20.0000 mg | ORAL_TABLET | Freq: Two times a day (BID) | ORAL | 0 refills | Status: DC

## 2018-06-19 MED ORDER — FAMOTIDINE 20 MG PO TABS
20.0000 mg | ORAL_TABLET | Freq: Once | ORAL | Status: AC
  Administered 2018-06-19: 20 mg via ORAL
  Filled 2018-06-19: qty 1

## 2018-06-19 NOTE — ED Notes (Signed)
NAD at this time. Pt is stable and going home.  

## 2018-06-19 NOTE — ED Provider Notes (Signed)
[facility] EMERGENCY DEPARTMENT Provider Note   CSN: [id number] Arrival date & time: 06/19/18  1152  History   Chief Complaint Chief Complaint  Patient presents with  . Urticaria    HPI Elizabeth Hooper is a 28 y.o. female with no significant past medical history who presents for evaluation of itching.  Patient states she has had "all over itching" for 2 days.  Denies new lotions, perfumes, new foods.  Denies fever, chills, nausea, vomiting, sensation of throat closing, tongue swelling, chest pain, shortness of breath, cough, abdominal pain, diarrhea or dysuria.  Has used OTC hydrocortisone cream without relief of symptoms.  Has not taken anything p.o. for her symptoms.  Denies previous history of anaphylaxis or allergic reaction.  Denies any aggravating or alleviating factors.  Symptoms are constant in nature.  History provided by patient.  No interpreter was used.  HPI  Past Medical History:  Diagnosis Date  . Muscular dystrophy (HCC)   . UTI (urinary tract infection) during pregnancy     Patient Active Problem List   Diagnosis Date Noted  . Normal labor 09/29/2013  . Supervision of high risk pregnancy in third trimester 07/22/2013  . 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 Previous cesarean section complicating pregnancy 07/22/2013  . GBS bacteriuria 07/16/2013    Past Surgical History:  Procedure Laterality Date  . CESAREAN SECTION    . CESAREAN SECTION N/A 09/27/2013   Procedure: CESAREAN SECTION WITH BILATERAL TUBAL LIGATION;  Surgeon: Lesly DukesKelly H Leggett, MD;  Location: [facility];  Service: Obstetrics;  Laterality: N/A;  . TUBAL LIGATION       OB History    Gravida  2   Para  2   Term  2   Preterm  0   AB      Living  2     SAB      TAB      Ectopic      Multiple      Live Births  2            Home Medications    Prior to Admission medications   Medication Sig Start Date End Date Taking? Authorizing Provider  benzonatate (TESSALON) 100  MG capsule Take 1 capsule (100 mg total) by mouth 3 (three) times daily as needed for cough. 06/04/18   Luevenia MaxinFawze, Mina A, PA-C  cetirizine (ZYRTEC ALLERGY) 10 MG tablet Take 1 tablet (10 mg total) by mouth daily. 04/08/18   Dartha LodgeFord, Kelsey N, PA-C  clindamycin (CLEOCIN) 300 MG capsule Take 1 capsule (300 mg total) by mouth 3 (three) times daily. 03/04/18   Azalia Bilisampos, Kevin, MD  diphenhydrAMINE (BENADRYL) 25 MG tablet Take 1 tablet (25 mg total) by mouth every 8 (eight) hours as needed for itching. 06/19/18   Dany Walther A, PA-C  famotidine (PEPCID) 20 MG tablet Take 1 tablet (20 mg total) by mouth 2 (two) times daily. 06/19/18   Alda Gaultney A, PA-C  fluticasone (FLONASE) 50 MCG/ACT nasal spray Place 2 sprays into both nostrils daily. 06/04/18   Fawze, Mina A, PA-C  ibuprofen (ADVIL,MOTRIN) 600 MG tablet Take 1 tablet (600 mg total) by mouth every 6 (six) hours as needed. 03/18/18   Law, Waylan BogaAlexandra M, PA-C  naproxen (NAPROSYN) 500 MG tablet Take 1 tablet (500 mg total) by mouth 2 (two) times daily. 04/08/18   Dartha LodgeFord, Kelsey N, PA-C  oxyCODONE-acetaminophen (PERCOCET/ROXICET) 5-325 MG tablet Take 1-2 tablets by mouth every 6 (six) hours as needed for severe pain. 03/18/18  Law, Alexandra M, PA-C  predniSONE (DELTASONE) 20 MG tablet Take 2 tablets (40 mg total) by mouth daily for 5 days. 06/19/18 06/24/18  Fayne Mcguffee A, PA-C  prochlorperazine (COMPAZINE) 10 MG tablet Take 1 tablet (10 mg total) by mouth 2 (two) times daily as needed for nausea or vomiting. 03/12/18   Tegeler, Canary Brimhristopher J, MD    Family History Family History  Problem Relation Age of Onset  . Muscular dystrophy Father   . Hypertension Father     Social History Social History   Tobacco Use  . Smoking status: Never Smoker  . Smokeless tobacco: Never Used  Substance Use Topics  . Alcohol use: No  . Drug use: No     Allergies   Hydrocodone and Penicillins   Review of Systems Review of Systems  Constitutional: Negative.     HENT: Negative.   Respiratory: Negative.   Cardiovascular: Negative.   Gastrointestinal: Negative.   Genitourinary: Negative.   Musculoskeletal: Negative.   Skin:       Pruritis  Neurological: Negative.   All other systems reviewed and are negative.    Physical Exam Updated Vital Signs BP 123/71   Pulse 80   Temp 98.8 F (37.1 C) (Oral)   Resp 18   Ht 5\' 1"  (1.549 m)   Wt 88.4 kg   LMP 05/22/2018   SpO2 100%   BMI 36.82 kg/m   Physical Exam Vitals signs and nursing note reviewed.  Constitutional:      General: She is not in acute distress.    Appearance: She is well-developed. She is not ill-appearing, toxic-appearing or diaphoretic.  HENT:     Head: Normocephalic and atraumatic.     Right Ear: Tympanic membrane, ear canal and external ear normal. There is no impacted cerumen.     Left Ear: Tympanic membrane, ear canal and external ear normal. There is no impacted cerumen.     Nose: Nose normal.     Mouth/Throat:     Mouth: Mucous membranes are moist.     Pharynx: Oropharynx is clear.     Comments: Posterior oropharynx clear.  Tongue midline without edema.  No oropharyngeal lesions.  Uvula midline without deviation.  No tonsillar edema or exudate. Eyes:     Pupils: Pupils are equal, round, and reactive to light.  Neck:     Musculoskeletal: Normal range of motion.     Comments: No stiffness or rigidity.  Phonation normal.  No drooling or dysphasia. Cardiovascular:     Rate and Rhythm: Normal rate.     Pulses: Normal pulses.     Heart sounds: Normal heart sounds. No murmur. No gallop.   Pulmonary:     Effort: No respiratory distress.     Comments: Clear to auscultation bilaterally without wheeze, rhonchi or rales. Abdominal:     General: There is no distension.     Comments: Soft, nontender without rebound or guarding.  Musculoskeletal: Normal range of motion.  Skin:    General: Skin is warm and dry.     Comments: No rashes, lesions.  There are multiple  areas of excoriation where patient has been scratching.  No warmth, erythema, edema.  No evidence of fluctuance or induration.  Skin nontender.  No bulla, target lesions, desquamated skin.  Neurological:     Mental Status: She is alert.      ED Treatments / Results  Labs (all labs ordered are listed, but only abnormal results are displayed) Labs Reviewed - No data  to display  EKG None  Radiology No results found.  Procedures Procedures (including critical care time)  Medications Ordered in ED Medications  diphenhydrAMINE (BENADRYL) capsule 25 mg (25 mg Oral Given 06/19/18 1247)  famotidine (PEPCID) tablet 20 mg (20 mg Oral Given 06/19/18 1247)     Initial Impression / Assessment and Plan / ED Course  I have reviewed the triage vital signs and the nursing notes.  Pertinent labs & imaging results that were available during my care of the patient were reviewed by me and considered in my medical decision making (see chart for details).  28 year old female presents for evaluation of pruritus. Afebrile, nonseptic, non-ill-appearing. Patient states this is all over.  Began 2 days ago.  Symptoms have not been worsening since onset.  No new lotions, perfumes or new food intake.  No evidence of anaphylaxis on exam.  Skin without erythema, warmth, fluctuance or induration.  Patient denies any difficulty breathing or swallowing.  Pt has a patent airway without stridor and is handling secretions without difficulty; no angioedema. No blisters, no pustules, no warmth, no draining sinus tracts, no superficial abscesses, no bullous impetigo, no vesicles, no desquamation, no target lesions with dusky purpura or a central bulla. Not tender to touch. No concern for superimposed infection. No concern for SJS, TEN, TSS, tick borne illness, syphilis or other life-threatening condition. Will discharge home with short course of steroids, pepcid and recommend Benadryl as needed for pruritis.  Abdomen soft,  nontender without rebound or guarding.  Patient re-evaluated prior to dc, is hemodynamically stable, in no respiratory distress, and denies the feeling of throat closing. Pt has been advised to take OTC benadryl & return to the ED if they have a mod-severe allergic rxn (s/s including throat closing, difficulty breathing, swelling of lips face or tongue).  Patient is hemodynamically stable and appropriate for DC home at this time.  I discussed strict return precautions.  Patient voiced understanding and is agreeable for follow-up.     Final Clinical Impressions(s) / ED Diagnoses   Final diagnoses:  Allergic reaction, initial encounter    ED Discharge Orders         Ordered    diphenhydrAMINE (BENADRYL) 25 MG tablet  Every 8 hours PRN     06/19/18 1251    famotidine (PEPCID) 20 MG tablet  2 times daily     06/19/18 1251    predniSONE (DELTASONE) 20 MG tablet  Daily     06/19/18 1251           Kyrese Gartman A, PA-C 06/19/18 1257    Vanetta Mulders, MD 06/30/18 1439

## 2018-06-19 NOTE — ED Triage Notes (Signed)
Itching x 2 days

## 2018-06-19 NOTE — Discharge Instructions (Addendum)
Your evaluated today for itching.  This is possibly an allergic reaction.  I will send you home with Benadryl, Pepcid as well as a short course of steroids.  Please take as prescribed.  Follow-up with PCP for reevaluation if you have continued symptoms.  Return to the ED for any worsening symptoms such as tongue swelling, sensation of throat closing, difficulty breathing.

## 2018-06-27 ENCOUNTER — Other Ambulatory Visit: Payer: Self-pay

## 2018-06-27 ENCOUNTER — Encounter (HOSPITAL_BASED_OUTPATIENT_CLINIC_OR_DEPARTMENT_OTHER): Payer: Self-pay | Admitting: Emergency Medicine

## 2018-06-27 ENCOUNTER — Emergency Department (HOSPITAL_BASED_OUTPATIENT_CLINIC_OR_DEPARTMENT_OTHER)
Admission: EM | Admit: 2018-06-27 | Discharge: 2018-06-27 | Disposition: A | Discharge status: Home / Self Care | Payer: Medicaid Other | Attending: Emergency Medicine | Admitting: Emergency Medicine

## 2018-06-27 DIAGNOSIS — Z79899 Other long term (current) drug therapy: Secondary | ICD-10-CM | POA: Diagnosis not present

## 2018-06-27 DIAGNOSIS — R21 Rash and other nonspecific skin eruption: Secondary | ICD-10-CM | POA: Diagnosis present

## 2018-06-27 DIAGNOSIS — L509 Urticaria, unspecified: Secondary | ICD-10-CM | POA: Insufficient documentation

## 2018-06-27 DIAGNOSIS — G7100 Muscular dystrophy, unspecified: Secondary | ICD-10-CM | POA: Insufficient documentation

## 2018-06-27 MED ORDER — CETIRIZINE HCL 10 MG PO TABS: 10.0000 mg | ORAL_TABLET | Freq: Every day | ORAL | 0 refills | Status: DC

## 2018-06-27 MED ORDER — PREDNISONE 20 MG PO TABS: 40.0000 mg | ORAL_TABLET | Freq: Every day | ORAL | 0 refills | Status: DC

## 2018-06-27 MED ORDER — TRIAMCINOLONE 0.1 % CREAM:EUCERIN CREAM 1:1: 1.0000 "application " | TOPICAL_CREAM | Freq: Three times a day (TID) | CUTANEOUS | 1 refills | Status: DC | PRN

## 2018-06-27 NOTE — ED Triage Notes (Signed)
Patient states that she has been itching since the 10th  - the patient reports that she has "bumps on her butt" - the patient denies any other bumps

## 2018-06-27 NOTE — ED Provider Notes (Signed)
[facility] EMERGENCY DEPARTMENT Provider Note   CSN: [id number] Arrival date & time: 06/27/18  1800     History   Chief Complaint Chief Complaint  Patient presents with  . Rash    HPI Elizabeth Hooper is a 28 y.o. female.  The history is provided by the patient.  Rash  Location:  Full body (worst on the back and buttocks) Quality: dryness, itchiness and redness   Severity:  Severe Onset quality:  Gradual Duration:  2 weeks Timing:  Constant Progression:  Worsening Chronicity:  New Context comment:  No new exposures or contacts.  no recent abx or meds.  no one else in home with issues Relieved by: some help with antihistamines. Worsened by:  Nothing Ineffective treatments:  Anti-itch cream Associated symptoms: no abdominal pain, no diarrhea, no fatigue, no fever, no nausea, no periorbital edema, no shortness of breath, no throat swelling, no tongue swelling and no URI     Past Medical History:  Diagnosis Date  . Muscular dystrophy (HCC)   . UTI (urinary tract infection) during pregnancy     Patient Active Problem List   Diagnosis Date Noted  . Normal labor 09/29/2013  . Supervision of high risk pregnancy in third trimester 07/22/2013  . 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 Previous cesarean section complicating pregnancy 07/22/2013  . GBS bacteriuria 07/16/2013    Past Surgical History:  Procedure Laterality Date  . CESAREAN SECTION    . CESAREAN SECTION N/A 09/27/2013   Procedure: CESAREAN SECTION WITH BILATERAL TUBAL LIGATION;  Surgeon: Lesly DukesKelly H Leggett, MD;  Location: [facility];  Service: Obstetrics;  Laterality: N/A;  . TUBAL LIGATION       OB History    Gravida  2   Para  2   Term  2   Preterm  0   AB      Living  2     SAB      TAB      Ectopic      Multiple      Live Births  2            Home Medications    Prior to Admission medications   Medication Sig Start Date End Date Taking? Authorizing Provider  benzonatate  (TESSALON) 100 MG capsule Take 1 capsule (100 mg total) by mouth 3 (three) times daily as needed for cough. 06/04/18   Luevenia MaxinFawze, Mina A, PA-C  cetirizine (ZYRTEC ALLERGY) 10 MG tablet Take 1 tablet (10 mg total) by mouth daily. 04/08/18   Dartha LodgeFord, Kelsey N, PA-C  clindamycin (CLEOCIN) 300 MG capsule Take 1 capsule (300 mg total) by mouth 3 (three) times daily. 03/04/18   Azalia Bilisampos, Kevin, MD  diphenhydrAMINE (BENADRYL) 25 MG tablet Take 1 tablet (25 mg total) by mouth every 8 (eight) hours as needed for itching. 06/19/18   Henderly, Britni A, PA-C  famotidine (PEPCID) 20 MG tablet Take 1 tablet (20 mg total) by mouth 2 (two) times daily. 06/19/18   Henderly, Britni A, PA-C  fluticasone (FLONASE) 50 MCG/ACT nasal spray Place 2 sprays into both nostrils daily. 06/04/18   Fawze, Mina A, PA-C  ibuprofen (ADVIL,MOTRIN) 600 MG tablet Take 1 tablet (600 mg total) by mouth every 6 (six) hours as needed. 03/18/18   Law, Waylan BogaAlexandra M, PA-C  naproxen (NAPROSYN) 500 MG tablet Take 1 tablet (500 mg total) by mouth 2 (two) times daily. 04/08/18   Dartha LodgeFord, Kelsey N, PA-C  oxyCODONE-acetaminophen (PERCOCET/ROXICET) 5-325 MG tablet Take 1-2 tablets  by mouth every 6 (six) hours as needed for severe pain. 03/18/18   Law, Waylan BogaAlexandra M, PA-C  prochlorperazine (COMPAZINE) 10 MG tablet Take 1 tablet (10 mg total) by mouth 2 (two) times daily as needed for nausea or vomiting. 03/12/18   Tegeler, Canary Brimhristopher J, MD    Family History Family History  Problem Relation Age of Onset  . Muscular dystrophy Father   . Hypertension Father     Social History Social History   Tobacco Use  . Smoking status: Never Smoker  . Smokeless tobacco: Never Used  Substance Use Topics  . Alcohol use: No  . Drug use: No     Allergies   Hydrocodone and Penicillins   Review of Systems Review of Systems  Constitutional: Negative for fatigue and fever.  Respiratory: Negative for shortness of breath.   Gastrointestinal: Negative for abdominal pain,  diarrhea and nausea.  Skin: Positive for rash.  All other systems reviewed and are negative.    Physical Exam Updated Vital Signs BP 120/86 (BP Location: Left Arm)   Pulse 89   Temp 98.8 F (37.1 C) (Oral)   Resp 18   Ht 5\' 1"  (1.549 m)   Wt 88.4 kg   LMP 06/13/2018   BMI 36.82 kg/m   Physical Exam Vitals signs and nursing note reviewed.  Constitutional:      Appearance: Normal appearance. She is normal weight.  HENT:     Head: Normocephalic.     Nose: Nose normal.     Mouth/Throat:     Mouth: Mucous membranes are moist.     Pharynx: Oropharynx is clear.     Comments: No tongue or uvula swelling Eyes:     Pupils: Pupils are equal, round, and reactive to light.  Cardiovascular:     Rate and Rhythm: Normal rate.  Pulmonary:     Effort: Pulmonary effort is normal.  Skin:    Capillary Refill: Capillary refill takes less than 2 seconds.     Findings: Rash present.     Comments: Fine confluent erythematous maculopapular rash over the lower back and upper buttocks.  No vesicular lesions, scaling or discharge.    Neurological:     General: No focal deficit present.     Mental Status: She is alert. Mental status is at baseline.  Psychiatric:        Mood and Affect: Mood normal.        Behavior: Behavior normal.        Thought Content: Thought content normal.      ED Treatments / Results  Labs (all labs ordered are listed, but only abnormal results are displayed) Labs Reviewed - No data to display  EKG None  Radiology No results found.  Procedures Procedures (including critical care time)  Medications Ordered in ED Medications - No data to display   Initial Impression / Assessment and Plan / ED Course  I have reviewed the triage vital signs and the nursing notes.  Pertinent labs & imaging results that were available during my care of the patient were reviewed by me and considered in my medical decision making (see chart for details).     Patient  presenting with a nonspecific rash without complicating features.  No evidence of abscess or supra bacterial infection.  It seems to be contact her urticarial in nature.  She cannot recall any exposures to anything new.  She is not changed any medications.  She does not have any bite marks consistent with scabies,  bedbugs or fleas.  She has no animals in her house has not recently moved and has no new mattresses or furniture.  Patient states Benadryl did help some but she stopped taking it after 1 week.  Recommended using Zyrtec, prednisone and Benadryl intermittently.  Also given some triamcinolone cream.  Stressed importance of follow-up with PCP at [facility] for referral to an allergist if symptoms do not improve.  Final Clinical Impressions(s) / ED Diagnoses   Final diagnoses:  Urticaria    ED Discharge Orders         Ordered    cetirizine (ZYRTEC ALLERGY) 10 MG tablet  Daily     06/27/18 1834    predniSONE (DELTASONE) 20 MG tablet  Daily     06/27/18 1834    Triamcinolone Acetonide (TRIAMCINOLONE 0.1 % CREAM : EUCERIN) CREA  3 times daily PRN     06/27/18 1834           Gwyneth Sprout, MD 06/27/18 1841

## 2018-07-06 ENCOUNTER — Encounter (HOSPITAL_BASED_OUTPATIENT_CLINIC_OR_DEPARTMENT_OTHER): Payer: Self-pay | Admitting: *Deleted

## 2018-07-06 ENCOUNTER — Other Ambulatory Visit: Payer: Self-pay

## 2018-07-06 ENCOUNTER — Emergency Department (HOSPITAL_BASED_OUTPATIENT_CLINIC_OR_DEPARTMENT_OTHER)
Admission: EM | Admit: 2018-07-06 | Discharge: 2018-07-06 | Disposition: A | Discharge status: Home / Self Care | Payer: Medicaid Other | Attending: Emergency Medicine | Admitting: Emergency Medicine

## 2018-07-06 DIAGNOSIS — G7100 Muscular dystrophy, unspecified: Secondary | ICD-10-CM | POA: Insufficient documentation

## 2018-07-06 DIAGNOSIS — Z79899 Other long term (current) drug therapy: Secondary | ICD-10-CM | POA: Diagnosis not present

## 2018-07-06 DIAGNOSIS — R42 Dizziness and giddiness: Secondary | ICD-10-CM

## 2018-07-06 LAB — PREGNANCY, URINE: Preg Test, Ur: NEGATIVE

## 2018-07-06 MED ORDER — MECLIZINE HCL 25 MG PO TABS: 25.0000 mg | ORAL_TABLET | Freq: Three times a day (TID) | ORAL | 0 refills | Status: AC | PRN

## 2018-07-06 MED ORDER — MECLIZINE HCL 25 MG PO TABS
25.0000 mg | ORAL_TABLET | Freq: Once | ORAL | Status: AC
  Administered 2018-07-06: 25 mg via ORAL
  Filled 2018-07-06: qty 1

## 2018-07-06 MED ORDER — ACETAMINOPHEN 325 MG PO TABS
650.0000 mg | ORAL_TABLET | Freq: Once | ORAL | Status: AC
  Administered 2018-07-06: 650 mg via ORAL
  Filled 2018-07-06: qty 2

## 2018-07-06 NOTE — ED Notes (Signed)
PT states understanding of care given, follow up care, and medication prescribed. PT ambulated from ED to car with a steady gait. 

## 2018-07-06 NOTE — ED Notes (Signed)
Urine collected and held in the lab. Awaiting orders.

## 2018-07-06 NOTE — ED Triage Notes (Signed)
Dizziness all day. Hx of same and no cause was found.

## 2018-07-06 NOTE — ED Provider Notes (Signed)
[facility] EMERGENCY DEPARTMENT Provider Note   CSN: [id number] Arrival date & time: 07/06/18  1821     History   Chief Complaint Chief Complaint  Patient presents with  . Dizziness    HPI Elizabeth Hooper is a 28 y.o. female.  The history is provided by the patient.  Dizziness  Quality:  Vertigo Severity:  Moderate Onset quality:  Gradual Timing:  Intermittent Progression:  Waxing and waning Chronicity:  New Context: head movement and standing up   Relieved by:  Being still Worsened by:  Movement Associated symptoms: no blood in stool, no chest pain, no diarrhea, no headaches, no hearing loss, no nausea, no palpitations, no shortness of breath, no syncope, no tinnitus, no vision changes, no vomiting and no weakness     Past Medical History:  Diagnosis Date  . Muscular dystrophy (HCC)   . UTI (urinary tract infection) during pregnancy     Patient Active Problem List   Diagnosis Date Noted  . Normal labor 09/29/2013  . Supervision of high risk pregnancy in third trimester 07/22/2013  . 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 Previous cesarean section complicating pregnancy 07/22/2013  . GBS bacteriuria 07/16/2013    Past Surgical History:  Procedure Laterality Date  . CESAREAN SECTION    . CESAREAN SECTION N/A 09/27/2013   Procedure: CESAREAN SECTION WITH BILATERAL TUBAL LIGATION;  Surgeon: Lesly DukesKelly H Leggett, MD;  Location: [facility];  Service: Obstetrics;  Laterality: N/A;  . TUBAL LIGATION       OB History    Gravida  2   Para  2   Term  2   Preterm  0   AB      Living  2     SAB      TAB      Ectopic      Multiple      Live Births  2            Home Medications    Prior to Admission medications   Medication Sig Start Date End Date Taking? Authorizing Provider  benzonatate (TESSALON) 100 MG capsule Take 1 capsule (100 mg total) by mouth 3 (three) times daily as needed for cough. 06/04/18   Luevenia MaxinFawze, Mina A, PA-C  cetirizine  (ZYRTEC ALLERGY) 10 MG tablet Take 1 tablet (10 mg total) by mouth daily. 06/27/18   Gwyneth SproutPlunkett, Whitney, MD  clindamycin (CLEOCIN) 300 MG capsule Take 1 capsule (300 mg total) by mouth 3 (three) times daily. 03/04/18   Azalia Bilisampos, Kevin, MD  diphenhydrAMINE (BENADRYL) 25 MG tablet Take 1 tablet (25 mg total) by mouth every 8 (eight) hours as needed for itching. 06/19/18   Henderly, Britni A, PA-C  famotidine (PEPCID) 20 MG tablet Take 1 tablet (20 mg total) by mouth 2 (two) times daily. 06/19/18   Henderly, Britni A, PA-C  fluticasone (FLONASE) 50 MCG/ACT nasal spray Place 2 sprays into both nostrils daily. 06/04/18   Fawze, Mina A, PA-C  ibuprofen (ADVIL,MOTRIN) 600 MG tablet Take 1 tablet (600 mg total) by mouth every 6 (six) hours as needed. 03/18/18   Law, Waylan BogaAlexandra M, PA-C  meclizine (ANTIVERT) 25 MG tablet Take 1 tablet (25 mg total) by mouth 3 (three) times daily as needed for up to 10 days for dizziness. 07/06/18 07/16/18  Diandre Merica, DO  naproxen (NAPROSYN) 500 MG tablet Take 1 tablet (500 mg total) by mouth 2 (two) times daily. 04/08/18   Dartha LodgeFord, Kelsey N, PA-C  oxyCODONE-acetaminophen (PERCOCET/ROXICET) 5-325 MG tablet  Take 1-2 tablets by mouth every 6 (six) hours as needed for severe pain. 03/18/18   Law, Waylan Boga, PA-C  predniSONE (DELTASONE) 20 MG tablet Take 2 tablets (40 mg total) by mouth daily. 06/27/18   Gwyneth Sprout, MD  prochlorperazine (COMPAZINE) 10 MG tablet Take 1 tablet (10 mg total) by mouth 2 (two) times daily as needed for nausea or vomiting. 03/12/18   Tegeler, Canary Brim, MD  Triamcinolone Acetonide (TRIAMCINOLONE 0.1 % CREAM : EUCERIN) CREA Apply 1 application topically 3 (three) times daily as needed. 1:1 concentration 06/27/18   Gwyneth Sprout, MD    Family History Family History  Problem Relation Age of Onset  . Muscular dystrophy Father   . Hypertension Father     Social History Social History   Tobacco Use  . Smoking status: Never Smoker  . Smokeless  tobacco: Never Used  Substance Use Topics  . Alcohol use: No  . Drug use: No     Allergies   Hydrocodone and Penicillins   Review of Systems Review of Systems  Constitutional: Negative for chills and fever.  HENT: Negative for ear pain, hearing loss, sore throat and tinnitus.   Eyes: Negative for pain and visual disturbance.  Respiratory: Negative for cough and shortness of breath.   Cardiovascular: Negative for chest pain, palpitations and syncope.  Gastrointestinal: Negative for abdominal pain, blood in stool, diarrhea, nausea and vomiting.  Genitourinary: Negative for dysuria and hematuria.  Musculoskeletal: Negative for arthralgias and back pain.  Skin: Negative for color change and rash.  Neurological: Positive for dizziness. Negative for seizures, syncope, weakness and headaches.  All other systems reviewed and are negative.    Physical Exam Updated Vital Signs BP 128/62 (BP Location: Right Arm)   Pulse 61   Temp 98.4 F (36.9 C) (Oral)   Resp 18   Ht 5\' 1"  (1.549 m) Comment: Simultaneous filing. User may not have seen previous data.  Wt 88.4 kg Comment: Simultaneous filing. User may not have seen previous data.  LMP 06/13/2018   SpO2 97%   BMI 36.82 kg/m   Physical Exam Vitals signs and nursing note reviewed.  Constitutional:      General: She is not in acute distress.    Appearance: She is well-developed.  HENT:     Head: Normocephalic and atraumatic.     Nose: Nose normal.     Mouth/Throat:     Mouth: Mucous membranes are moist.  Eyes:     Extraocular Movements: Extraocular movements intact.     Conjunctiva/sclera: Conjunctivae normal.     Pupils: Pupils are equal, round, and reactive to light.     Comments: No visual field deficit  Neck:     Musculoskeletal: Normal range of motion and neck supple.  Cardiovascular:     Rate and Rhythm: Normal rate and regular rhythm.     Pulses: Normal pulses.     Heart sounds: Normal heart sounds. No murmur.    Pulmonary:     Effort: Pulmonary effort is normal. No respiratory distress.     Breath sounds: Normal breath sounds.  Abdominal:     General: Abdomen is flat.     Palpations: Abdomen is soft.     Tenderness: There is no abdominal tenderness.  Skin:    General: Skin is warm and dry.     Capillary Refill: Capillary refill takes less than 2 seconds.  Neurological:     General: No focal deficit present.     Mental Status: She  is alert and oriented to person, place, and time.     Cranial Nerves: No cranial nerve deficit.     Sensory: No sensory deficit.     Motor: No weakness.     Coordination: Coordination normal.     Comments: 5+ out of 5 strength throughout, normal sensation, normal finger-nose-finger, gait at her baseline, no drift  Psychiatric:        Mood and Affect: Mood normal.      ED Treatments / Results  Labs (all labs ordered are listed, but only abnormal results are displayed) Labs Reviewed  PREGNANCY, URINE    EKG EKG Interpretation  Date/Time:  Monday July 06 2018 20:58:35 EST Ventricular Rate:  83 PR Interval:  138 QRS Duration: 82 QT Interval:  374 QTC Calculation: 439 R Axis:   14 Text Interpretation:  Normal sinus rhythm Confirmed by Virgina Norfolk [phone]) on 07/06/2018 9:37:31 PM   Radiology No results found.  Procedures Procedures (including critical care time)  Medications Ordered in ED Medications  meclizine (ANTIVERT) tablet 25 mg (25 mg Oral Given 07/06/18 2108)  acetaminophen (TYLENOL) tablet 650 mg (650 mg Oral Given 07/06/18 2108)     Initial Impression / Assessment and Plan / ED Course  I have reviewed the triage vital signs and the nursing notes.  Pertinent labs & imaging results that were available during my care of the patient were reviewed by me and considered in my medical decision making (see chart for details).     Elizabeth Hooper is a 28 year old female who presents to the ED with dizziness.  Patient with history of  muscular dystrophy.  Patient with normal vitals.  No fever. Dizziness started today and is worse with movement.  Resolves at rest.  Symptoms are not continuous.  No obvious nystagmus.  No vision changes.  No headaches.  Patient with normal neurological exam.  Is overall asymptomatic upon my examination.  Patient with no chest pain, no shortness of breath.  History and physical is consistent with a peripheral vertigo.  Symptoms improved with meclizine and Tylenol.  Patient states that she has not had much to eat or drink today.  Possible patient with some dehydration.  However appears clinically hydrated.  Vitals are reassuring.  Neuro exam is normal.  Doubt central process.  Given prescription for meclizine and recommend follow-up with primary care doctor.  Told to return to the ED if symptoms worsen.  This chart was dictated using voice recognition software.  Despite best efforts to proofread,  errors can occur which can change the documentation meaning.    Final Clinical Impressions(s) / ED Diagnoses   Final diagnoses:  Vertigo    ED Discharge Orders         Ordered    meclizine (ANTIVERT) 25 MG tablet  3 times daily PRN     07/06/18 2205           Virgina Norfolk, DO 07/07/18 0014

## 2018-07-28 ENCOUNTER — Emergency Department (HOSPITAL_BASED_OUTPATIENT_CLINIC_OR_DEPARTMENT_OTHER)
Admission: EM | Admit: 2018-07-28 | Discharge: 2018-07-28 | Disposition: A | Discharge status: Home / Self Care | Payer: Medicaid Other | Attending: Emergency Medicine | Admitting: Emergency Medicine

## 2018-07-28 ENCOUNTER — Other Ambulatory Visit: Payer: Self-pay

## 2018-07-28 ENCOUNTER — Encounter (HOSPITAL_BASED_OUTPATIENT_CLINIC_OR_DEPARTMENT_OTHER): Payer: Self-pay | Admitting: *Deleted

## 2018-07-28 DIAGNOSIS — Z79899 Other long term (current) drug therapy: Secondary | ICD-10-CM | POA: Insufficient documentation

## 2018-07-28 DIAGNOSIS — R002 Palpitations: Secondary | ICD-10-CM | POA: Diagnosis present

## 2018-07-28 DIAGNOSIS — I493 Ventricular premature depolarization: Secondary | ICD-10-CM

## 2018-07-28 DIAGNOSIS — E876 Hypokalemia: Secondary | ICD-10-CM

## 2018-07-28 LAB — BASIC METABOLIC PANEL
Anion gap: 7 (ref 5–15)
BUN: 9 mg/dL (ref 6–20)
CO2: 21 mmol/L — ABNORMAL LOW (ref 22–32)
Calcium: 8.4 mg/dL — ABNORMAL LOW (ref 8.9–10.3)
Chloride: 109 mmol/L (ref 98–111)
Creatinine, Ser: 0.54 mg/dL (ref 0.44–1.00)
GFR calc Af Amer: >60 mL/min (ref >60)
GFR calc non Af Amer: >60 mL/min (ref >60)
GLUCOSE: 122 mg/dL — AB (ref 70–99)
Potassium: 3.3 mmol/L — ABNORMAL LOW (ref 3.5–5.1)
Sodium: 137 mmol/L (ref 135–145)

## 2018-07-28 LAB — CBC
HCT: 39.1 % (ref 36.0–46.0)
Hemoglobin: 12.1 g/dL (ref 12.0–15.0)
MCH: 24.9 pg — ABNORMAL LOW (ref 26.0–34.0)
MCHC: 30.9 g/dL (ref 30.0–36.0)
MCV: 80.6 fL (ref 80.0–100.0)
PLATELETS: 252 10*3/uL (ref 150–400)
RBC: 4.85 MIL/uL (ref 3.87–5.11)
RDW: 14.8 % (ref 11.5–15.5)
WBC: 8 10*3/uL (ref 4.0–10.5)
nRBC: 0 % (ref 0.0–0.2)

## 2018-07-28 LAB — PREGNANCY, URINE: Preg Test, Ur: NEGATIVE

## 2018-07-28 LAB — TSH: TSH: 0.76 u[IU]/mL (ref 0.350–4.500)

## 2018-07-28 MED ORDER — POTASSIUM CHLORIDE CRYS ER 20 MEQ PO TBCR
40.0000 meq | EXTENDED_RELEASE_TABLET | Freq: Once | ORAL | Status: AC
  Administered 2018-07-28: 40 meq via ORAL
  Filled 2018-07-28: qty 2

## 2018-07-28 MED ORDER — POTASSIUM CHLORIDE CRYS ER 20 MEQ PO TBCR: 20.0000 meq | EXTENDED_RELEASE_TABLET | Freq: Every day | ORAL | 0 refills | Status: DC

## 2018-07-28 NOTE — Discharge Instructions (Addendum)
You were seen in the ER today for palpitations.  Your potassium & calcium were somewhat low, please follow the attached diet recommendations, we have also given you a prescription for potassium to take for the next few days.   We have prescribed you new medication(s) today. Discuss the medications prescribed today with your pharmacist as they can have adverse effects and interactions with your other medicines including over the counter and prescribed medications. Seek medical evaluation if you start to experience new or abnormal symptoms after taking one of these medicines, seek care immediately if you start to experience difficulty breathing, feeling of your throat closing, facial swelling, or rash as these could be indications of a more serious allergic reaction  Your heart monitor here showed some PVCs ( please see attached handout)  Please follow up with cardiology or primary care for possible further monitoring in the next 3 days. Do not consume caffeine, exercise, or utilize any type of illicit substances. Return to the ER for new or worsening symptoms or any other concerns.

## 2018-07-28 NOTE — ED Triage Notes (Signed)
Pt said that for the last "Two days, it felt as if her heart would beat hard" then go away.

## 2018-07-28 NOTE — ED Notes (Signed)
ED Provider at bedside. 

## 2018-07-28 NOTE — ED Provider Notes (Signed)
[facility] EMERGENCY DEPARTMENT Provider Note   CSN: [id number] Arrival date & time: 07/28/18  1108    History   Chief Complaint Chief Complaint  Patient presents with  . Palpitations    HPI Elizabeth Hooper is a 28 y.o. female with a hx of muscular dystrophy who is s/p tubal ligation presents to the ED with complaint of palpitations that began 2 days prior. Patient describes the palpitations as a "hard beat". States this occurs several times per hour (approximatley 5-7) and is not continuous. She notes increase in occurs with anxiety, but no other specific triggers/aggravating factors. No alleviating factors. No associated chest pain, dyspnea, lightheadedness, dizziness, or syncope. Not exercise related. Denies excess caffeine intake, insomnia, or new medicines. She has had general increased stress recently, no SI/HI. No hx of similar. Of note she has recently had abnormal thyroid testing & has seen endocrinology, f/u appointment in March.     HPI  Past Medical History:  Diagnosis Date  . Muscular dystrophy (HCC)   . UTI (urinary tract infection) during pregnancy     Patient Active Problem List   Diagnosis Date Noted  . Normal labor 09/29/2013  . Supervision of high risk pregnancy in third trimester 07/22/2013  . 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 Previous cesarean section complicating pregnancy 07/22/2013  . GBS bacteriuria 07/16/2013    Past Surgical History:  Procedure Laterality Date  . CESAREAN SECTION    . CESAREAN SECTION N/A 09/27/2013   Procedure: CESAREAN SECTION WITH BILATERAL TUBAL LIGATION;  Surgeon: Lesly Dukes, MD;  Location: [facility];  Service: Obstetrics;  Laterality: N/A;  . TUBAL LIGATION       OB History    Gravida  2   Para  2   Term  2   Preterm  0   AB      Living  2     SAB      TAB      Ectopic      Multiple      Live Births  2            Home Medications    Prior to Admission medications   Medication  Sig Start Date End Date Taking? Authorizing Provider  benzonatate (TESSALON) 100 MG capsule Take 1 capsule (100 mg total) by mouth 3 (three) times daily as needed for cough. 06/04/18   Luevenia Maxin, Mina A, PA-C  cetirizine (ZYRTEC ALLERGY) 10 MG tablet Take 1 tablet (10 mg total) by mouth daily. 06/27/18   Gwyneth Sprout, MD  clindamycin (CLEOCIN) 300 MG capsule Take 1 capsule (300 mg total) by mouth 3 (three) times daily. 03/04/18   Azalia Bilis, MD  diphenhydrAMINE (BENADRYL) 25 MG tablet Take 1 tablet (25 mg total) by mouth every 8 (eight) hours as needed for itching. 06/19/18   Henderly, Britni A, PA-C  famotidine (PEPCID) 20 MG tablet Take 1 tablet (20 mg total) by mouth 2 (two) times daily. 06/19/18   Henderly, Britni A, PA-C  fluticasone (FLONASE) 50 MCG/ACT nasal spray Place 2 sprays into both nostrils daily. 06/04/18   Fawze, Mina A, PA-C  ibuprofen (ADVIL,MOTRIN) 600 MG tablet Take 1 tablet (600 mg total) by mouth every 6 (six) hours as needed. 03/18/18   Law, Waylan Boga, PA-C  naproxen (NAPROSYN) 500 MG tablet Take 1 tablet (500 mg total) by mouth 2 (two) times daily. 04/08/18   Dartha Lodge, PA-C  oxyCODONE-acetaminophen (PERCOCET/ROXICET) 5-325 MG tablet Take 1-2 tablets by mouth every  6 (six) hours as needed for severe pain. 03/18/18   Law, Waylan Boga, PA-C  predniSONE (DELTASONE) 20 MG tablet Take 2 tablets (40 mg total) by mouth daily. 06/27/18   Gwyneth Sprout, MD  prochlorperazine (COMPAZINE) 10 MG tablet Take 1 tablet (10 mg total) by mouth 2 (two) times daily as needed for nausea or vomiting. 03/12/18   Tegeler, Canary Brim, MD  Triamcinolone Acetonide (TRIAMCINOLONE 0.1 % CREAM : EUCERIN) CREA Apply 1 application topically 3 (three) times daily as needed. 1:1 concentration 06/27/18   Gwyneth Sprout, MD    Family History Family History  Problem Relation Age of Onset  . Muscular dystrophy Father   . Hypertension Father     Social History Social History   Tobacco Use  .  Smoking status: Never Smoker  . Smokeless tobacco: Never Used  Substance Use Topics  . Alcohol use: No  . Drug use: No     Allergies   Hydrocodone and Penicillins   Review of Systems Review of Systems  Constitutional: Negative for chills and fever.  Respiratory: Negative for shortness of breath.   Cardiovascular: Positive for palpitations. Negative for chest pain and leg swelling.  Gastrointestinal: Negative for abdominal pain, nausea and vomiting.  Neurological: Negative for dizziness, syncope and light-headedness.  Psychiatric/Behavioral: Negative for suicidal ideas. The patient is nervous/anxious.   All other systems reviewed and are negative.    Physical Exam Updated Vital Signs BP 129/62 (BP Location: Left Arm)   Pulse 75   Temp 98.1 F (36.7 C) (Oral)   Resp 18   Ht 5\' 1"  (1.549 m)   Wt 88.4 kg   SpO2 100%   BMI 36.82 kg/m   Physical Exam Vitals signs and nursing note reviewed.  Constitutional:      General: She is not in acute distress.    Appearance: She is well-developed. She is not toxic-appearing.  HENT:     Head: Normocephalic and atraumatic.  Eyes:     General:        Right eye: No discharge.        Left eye: No discharge.     Conjunctiva/sclera: Conjunctivae normal.  Neck:     Musculoskeletal: Neck supple.  Cardiovascular:     Rate and Rhythm: Normal rate and regular rhythm.     Pulses: Normal pulses.     Heart sounds: No murmur. No friction rub. No gallop.   Pulmonary:     Effort: Pulmonary effort is normal. No respiratory distress.     Breath sounds: Normal breath sounds. No wheezing, rhonchi or rales.  Abdominal:     General: There is no distension.     Palpations: Abdomen is soft.     Tenderness: There is no abdominal tenderness.  Skin:    General: Skin is warm and dry.     Findings: No rash.  Neurological:     Mental Status: She is alert.     Comments: Clear speech.   Psychiatric:        Behavior: Behavior normal.    ED  Treatments / Results  Labs (all labs ordered are listed, but only abnormal results are displayed) Labs Reviewed  CBC - Abnormal; Notable for the following components:      Result Value   MCH 24.9 (*)    All other components within normal limits  BASIC METABOLIC PANEL - Abnormal; Notable for the following components:   Potassium 3.3 (*)    CO2 21 (*)    Glucose, Bld  122 (*)    Calcium 8.4 (*)    All other components within normal limits  TSH  PREGNANCY, URINE    EKG EKG Interpretation  Date/Time:  Tuesday July 28 2018 11:28:19 EST Ventricular Rate:  87 PR Interval:    QRS Duration: 97 QT Interval:  364 QTC Calculation: 438 R Axis:   61 Text Interpretation:  Sinus rhythm Artifact in lead(s) I II III aVR aVL aVF V2 and baseline wander in lead(s) V4 V5 No significant change since last tracing Confirmed by Frederick PeersLittle, Rachel [phone]([id number]) on 07/28/2018 11:33:34 AM Also confirmed by Frederick PeersLittle, Rachel [phone]), editor Barbette Hairassel, Kerry [phone])  on 07/28/2018 11:42:50 AM   Radiology No results found.  Procedures Procedures (including critical care time)  Medications Ordered in ED Medications  potassium chloride SA (K-DUR,KLOR-CON) CR tablet 40 mEq (has no administration in time range)     Initial Impression / Assessment and Plan / ED Course  I have reviewed the triage vital signs and the nursing notes.  Pertinent labs & imaging results that were available during my care of the patient were reviewed by me and considered in my medical decision making (see chart for details).   Patient presents to the ED with intermittent palpitations x 2 days. Nontoxic appearing, in no apparent distress, initial vitals WNL. Fairly benign physical exam Heart RRR, no murmur/rub/gallop. Basic labs, EKG, cardiac monitoring.   CBC: No anemia or leukocytosis BMP: Mild hypokalemia @ 3.3- will provide oral supplementation & diet recommendations. Mild hypocalcemia @ 8.4- similar to prior, diet recommendations. EKG  without significant QTc prolongation or appreciable U wave.  EKG: Artifact, no significant change from prior.  Cardiac monitor reviewed: PVCs noted, otherwise normal.   TSH remains pending, was able to locate prior labs, did have mildly low TSH, but recent thyroid peroxidase Ab, T3, and free T4 WNL, being followed by endocrinology. No chest pain/dyspnea to raise concern for ACS/PE/PTX.  PVCs are possible underlying cause. We will have her follow up w/ PCP vs cardiology for potential holter monitor. Discussed lifestyle modifications. I discussed results, treatment plan, need for follow-up, and return precautions with the patient. Provided opportunity for questions, patient confirmed understanding and is in agreement with plan.   Final Clinical Impressions(s) / ED Diagnoses   Final diagnoses:  Palpitations  PVC's (premature ventricular contractions)  Hypokalemia  Hypocalcemia    ED Discharge Orders         Ordered    potassium chloride SA (K-DUR,KLOR-CON) 20 MEQ tablet  Daily     07/28/18 [id number]           Caroline Longie, South LaurelSamantha R, PA-C 07/28/18 1232    Little, Ambrose Finlandachel Morgan, MD 07/28/18 1517

## 2018-08-01 ENCOUNTER — Emergency Department (HOSPITAL_BASED_OUTPATIENT_CLINIC_OR_DEPARTMENT_OTHER)
Admission: EM | Admit: 2018-08-01 | Discharge: 2018-08-01 | Disposition: A | Discharge status: Home / Self Care | Payer: Medicaid Other | Attending: Emergency Medicine | Admitting: Emergency Medicine

## 2018-08-01 ENCOUNTER — Other Ambulatory Visit: Payer: Self-pay

## 2018-08-01 ENCOUNTER — Encounter (HOSPITAL_BASED_OUTPATIENT_CLINIC_OR_DEPARTMENT_OTHER): Payer: Self-pay | Admitting: Emergency Medicine

## 2018-08-01 DIAGNOSIS — M79605 Pain in left leg: Secondary | ICD-10-CM

## 2018-08-01 DIAGNOSIS — R252 Cramp and spasm: Secondary | ICD-10-CM | POA: Diagnosis not present

## 2018-08-01 DIAGNOSIS — R002 Palpitations: Secondary | ICD-10-CM | POA: Insufficient documentation

## 2018-08-01 DIAGNOSIS — G7100 Muscular dystrophy, unspecified: Secondary | ICD-10-CM | POA: Insufficient documentation

## 2018-08-01 NOTE — ED Triage Notes (Signed)
Patient states that she is having pain to her left upper leg just above the knee x 2 days

## 2018-08-01 NOTE — Discharge Instructions (Addendum)
You have mild achy pain of your left leg.  With a history of a superficial thrombo-phlebitis.  I do not have any evidence that right now.  I have low suspicion for deep vein thrombosis as well.  I would start using ibuprofen, Ace wrap and heating pad.  If this does not help within 2 or 3 days see your doctor for possible ultrasound or further work-up.  So asked about your premature ventricular contractions.  I would reduce your caffeine intake.  Ideally stop caffeine altogether but realistically decrease it is much as you can and see if this helps her symptoms if it does you should be fine but if not you need to follow-up with a cardiologist.

## 2018-08-02 NOTE — ED Provider Notes (Signed)
Emergency Department Provider Note   I have reviewed the triage vital signs and the nursing notes.   HISTORY  Chief Complaint No chief complaint on file.   HPI Elizabeth Hooper is a 28 y.o. female has a history of superficial thrombophlebitis the presents to the emergency department today secondary to crampy leg pain.  Patient states that this is been going on for the last few days.  She had thrombophlebitis 5 years ago estimation she does have another blood clot.  Patient does not have any shortness of breath, chest pain, leg swelling.  Patient symptoms do not improve or worsen with exertion.  No fevers.  No rashes.  No palpable cords. No other associated or modifying symptoms.    Past Medical History:  Diagnosis Date  . Muscular dystrophy (HCC)   . UTI (urinary tract infection) during pregnancy     Patient Active Problem List   Diagnosis Date Noted  . Normal labor 09/29/2013  . Supervision of high risk pregnancy in third trimester 07/22/2013  . 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 Previous cesarean section complicating pregnancy 07/22/2013  . GBS bacteriuria 07/16/2013    Past Surgical History:  Procedure Laterality Date  . CESAREAN SECTION    . CESAREAN SECTION N/A 09/27/2013   Procedure: CESAREAN SECTION WITH BILATERAL TUBAL LIGATION;  Surgeon: Lesly Dukes, MD;  Location: [facility];  Service: Obstetrics;  Laterality: N/A;  . TUBAL LIGATION      Current Outpatient Rx  . Order #: [id number] Class: Print  . Order #: [id number] Class: Normal  . Order #: [id number] Class: Print  . Order #: [id number] Class: Normal  . Order #: [id number] Class: Normal  . Order #: [id number] Class: Print  . Order #: [id number] Class: Print  . Order #: [id number] Class: Print  . Order #: [id number] Class: Print  . Order #: [id number] Class: Normal  . Order #: [id number] Class: Normal  . Order #: [id number] Class: Print  . Order #: [id number] Class: Print    Allergies Hydrocodone and Penicillins  Family  History  Problem Relation Age of Onset  . Muscular dystrophy Father   . Hypertension Father     Social History Social History   Tobacco Use  . Smoking status: Never Smoker  . Smokeless tobacco: Never Used  Substance Use Topics  . Alcohol use: No  . Drug use: No    Review of Systems  All other systems negative except as documented in the HPI. All pertinent positives and negatives as reviewed in the HPI. ____________________________________________   PHYSICAL EXAM:  VITAL SIGNS: ED Triage Vitals  Enc Vitals Group     BP 08/01/18 2211 124/74     Pulse Rate 08/01/18 2211 78     Resp 08/01/18 2211 18     Temp 08/01/18 2211 98.2 F (36.8 C)     Temp Source 08/01/18 2211 Oral     SpO2 08/01/18 2211 100 %     Weight 08/01/18 2208 195 lb (88.5 kg)     Height 08/01/18 2208 5\' 1"  (1.549 m)     Head Circumference --      Peak Flow --      Pain Score 08/01/18 2208 5     Pain Loc --      Pain Edu? --      Excl. in GC? --     Constitutional: Alert and oriented. Well appearing and in no acute distress. Eyes: Conjunctivae are normal. PERRL. EOMI. Head: Atraumatic. Nose: No congestion/rhinnorhea. Mouth/Throat: Mucous membranes are moist.  Oropharynx non-erythematous. Neck: No  stridor.  No meningeal signs.   Cardiovascular: Normal rate, regular rhythm. Good peripheral circulation. Grossly normal heart sounds.   Respiratory: Normal respiratory effort.  No retractions. Lungs CTAB. Gastrointestinal: Soft and nontender. No distention.  Musculoskeletal: No lower extremity tenderness nor edema. No gross deformities of extremities. Neurologic:  Normal speech and language. No gross focal neurologic deficits are appreciated.  Skin:  Skin is warm, dry and intact. No rash noted.   ____________________________________________    INITIAL IMPRESSION / ASSESSMENT AND PLAN / ED COURSE  We will treat for muscular causes and if not proving a few days we will follow-up with her primary  doctor.  Also asked me about her palpitations for which she was seen a few days ago.  I discussed decreasing her caffeine intake and seeing how that did as she really did not want to see a specialist that she need to.  She understands that she will need to see them if this does not improve.   Pertinent labs & imaging results that were available during my care of the patient were reviewed by me and considered in my medical decision making (see chart for details).  ____________________________________________  FINAL CLINICAL IMPRESSION(S) / ED DIAGNOSES  Final diagnoses:  Pain of left lower extremity  Palpitations     MEDICATIONS GIVEN DURING THIS VISIT:  Medications - No data to display   NEW OUTPATIENT MEDICATIONS STARTED DURING THIS VISIT:  Discharge Medication List as of 08/01/2018 11:40 PM      Note:  This note was prepared with assistance of Dragon voice recognition software. Occasional wrong-word or sound-a-like substitutions may have occurred due to the inherent limitations of voice recognition software.   Denicia Pagliarulo, Barbara Cower, MD 08/02/18 [phone]

## 2018-12-15 ENCOUNTER — Other Ambulatory Visit: Payer: Self-pay

## 2018-12-15 ENCOUNTER — Emergency Department (HOSPITAL_BASED_OUTPATIENT_CLINIC_OR_DEPARTMENT_OTHER): Payer: Medicaid Other

## 2018-12-15 ENCOUNTER — Encounter (HOSPITAL_BASED_OUTPATIENT_CLINIC_OR_DEPARTMENT_OTHER): Payer: Self-pay

## 2018-12-15 ENCOUNTER — Emergency Department (HOSPITAL_BASED_OUTPATIENT_CLINIC_OR_DEPARTMENT_OTHER)
Admission: EM | Admit: 2018-12-15 | Discharge: 2018-12-15 | Disposition: A | Discharge status: Home / Self Care | Payer: Medicaid Other | Attending: Emergency Medicine | Admitting: Emergency Medicine

## 2018-12-15 DIAGNOSIS — R42 Dizziness and giddiness: Secondary | ICD-10-CM | POA: Insufficient documentation

## 2018-12-15 DIAGNOSIS — Z79899 Other long term (current) drug therapy: Secondary | ICD-10-CM | POA: Diagnosis not present

## 2018-12-15 DIAGNOSIS — G7100 Muscular dystrophy, unspecified: Secondary | ICD-10-CM | POA: Insufficient documentation

## 2018-12-15 DIAGNOSIS — R51 Headache: Secondary | ICD-10-CM | POA: Diagnosis present

## 2018-12-15 HISTORY — DX: Dizziness and giddiness: R42

## 2018-12-15 LAB — CBC WITH DIFFERENTIAL/PLATELET
Abs Immature Granulocytes: 0.06 10*3/uL (ref 0.00–0.07)
Basophils Absolute: 0 10*3/uL (ref 0.0–0.1)
Basophils Relative: 0 %
Eosinophils Absolute: 0.1 10*3/uL (ref 0.0–0.5)
Eosinophils Relative: 1 %
HCT: 38.4 % (ref 36.0–46.0)
Hemoglobin: 12 g/dL (ref 12.0–15.0)
Immature Granulocytes: 1 %
Lymphocytes Relative: 30 %
Lymphs Abs: 3.4 10*3/uL (ref 0.7–4.0)
MCH: 25.4 pg — ABNORMAL LOW (ref 26.0–34.0)
MCHC: 31.3 g/dL (ref 30.0–36.0)
MCV: 81.2 fL (ref 80.0–100.0)
Monocytes Absolute: 0.6 10*3/uL (ref 0.1–1.0)
Monocytes Relative: 5 %
Neutro Abs: 7.3 10*3/uL (ref 1.7–7.7)
Neutrophils Relative %: 63 %
Platelets: 268 10*3/uL (ref 150–400)
RBC: 4.73 MIL/uL (ref 3.87–5.11)
RDW: 13.5 % (ref 11.5–15.5)
WBC: 11.4 10*3/uL — ABNORMAL HIGH (ref 4.0–10.5)
nRBC: 0 % (ref 0.0–0.2)

## 2018-12-15 LAB — BASIC METABOLIC PANEL
Anion gap: 11 (ref 5–15)
BUN: 8 mg/dL (ref 6–20)
CO2: 22 mmol/L (ref 22–32)
Calcium: 8.7 mg/dL — ABNORMAL LOW (ref 8.9–10.3)
Chloride: 103 mmol/L (ref 98–111)
Creatinine, Ser: 0.45 mg/dL (ref 0.44–1.00)
GFR calc Af Amer: >60 mL/min (ref >60)
GFR calc non Af Amer: >60 mL/min (ref >60)
Glucose, Bld: 99 mg/dL (ref 70–99)
Potassium: 3.4 mmol/L — ABNORMAL LOW (ref 3.5–5.1)
Sodium: 136 mmol/L (ref 135–145)

## 2018-12-15 LAB — PREGNANCY, URINE: Preg Test, Ur: NEGATIVE

## 2018-12-15 MED ORDER — MECLIZINE HCL 25 MG PO TABS: 25.0000 mg | ORAL_TABLET | Freq: Three times a day (TID) | ORAL | 0 refills | Status: DC | PRN

## 2018-12-15 MED ORDER — PROCHLORPERAZINE EDISYLATE 10 MG/2ML IJ SOLN
10.0000 mg | Freq: Once | INTRAMUSCULAR | Status: AC
  Administered 2018-12-15: 10 mg via INTRAVENOUS
  Filled 2018-12-15: qty 2

## 2018-12-15 NOTE — ED Provider Notes (Signed)
[facility] EMERGENCY DEPARTMENT Provider Note   CSN: [id number] Arrival date & time: 12/15/18  2055    History   Chief Complaint Chief Complaint  Patient presents with  . Headache  . Dizziness    HPI Elizabeth Hooper is a 28 y.o. female.     The history is provided by the patient.  Dizziness Quality:  Room spinning Severity:  Mild Onset quality:  Gradual Duration:  1 week Timing:  Intermittent Progression:  Waxing and waning Chronicity:  Recurrent Context: head movement (when lying flat at times)   Relieved by:  Change in position Worsened by:  Lying down Associated symptoms: headaches   Associated symptoms: no chest pain, no palpitations, no shortness of breath and no vomiting   Risk factors: anemia and hx of vertigo     Past Medical History:  Diagnosis Date  . Muscular dystrophy (HCC)   . UTI (urinary tract infection) during pregnancy   . Vertigo     Patient Active Problem List   Diagnosis Date Noted  . Normal labor 09/29/2013  . Supervision of high risk pregnancy in third trimester 07/22/2013  . 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 Previous cesarean section complicating pregnancy 07/22/2013  . GBS bacteriuria 07/16/2013    Past Surgical History:  Procedure Laterality Date  . CESAREAN SECTION    . CESAREAN SECTION N/A 09/27/2013   Procedure: CESAREAN SECTION WITH BILATERAL TUBAL LIGATION;  Surgeon: Lesly DukesKelly H Leggett, MD;  Location: [facility];  Service: Obstetrics;  Laterality: N/A;  . TUBAL LIGATION       OB History    Gravida  2   Para  2   Term  2   Preterm  0   AB      Living  2     SAB      TAB      Ectopic      Multiple      Live Births  2            Home Medications    Prior to Admission medications   Medication Sig Start Date End Date Taking? Authorizing Provider  benzonatate (TESSALON) 100 MG capsule Take 1 capsule (100 mg total) by mouth 3 (three) times daily as needed for cough. 06/04/18   Luevenia MaxinFawze, Mina A,  PA-C  cetirizine (ZYRTEC ALLERGY) 10 MG tablet Take 1 tablet (10 mg total) by mouth daily. 06/27/18   Gwyneth SproutPlunkett, Whitney, MD  clindamycin (CLEOCIN) 300 MG capsule Take 1 capsule (300 mg total) by mouth 3 (three) times daily. 03/04/18   Azalia Bilisampos, Kevin, MD  diphenhydrAMINE (BENADRYL) 25 MG tablet Take 1 tablet (25 mg total) by mouth every 8 (eight) hours as needed for itching. 06/19/18   Henderly, Britni A, PA-C  famotidine (PEPCID) 20 MG tablet Take 1 tablet (20 mg total) by mouth 2 (two) times daily. 06/19/18   Henderly, Britni A, PA-C  fluticasone (FLONASE) 50 MCG/ACT nasal spray Place 2 sprays into both nostrils daily. 06/04/18   Fawze, Mina A, PA-C  ibuprofen (ADVIL,MOTRIN) 600 MG tablet Take 1 tablet (600 mg total) by mouth every 6 (six) hours as needed. 03/18/18   Law, Waylan BogaAlexandra M, PA-C  meclizine (ANTIVERT) 25 MG tablet Take 1 tablet (25 mg total) by mouth 3 (three) times daily as needed for up to 20 doses for dizziness. 12/15/18   Providencia Hottenstein, DO  naproxen (NAPROSYN) 500 MG tablet Take 1 tablet (500 mg total) by mouth 2 (two) times daily. 04/08/18   Ala DachFord,  Arva ChafeKelsey N, PA-C  oxyCODONE-acetaminophen (PERCOCET/ROXICET) 5-325 MG tablet Take 1-2 tablets by mouth every 6 (six) hours as needed for severe pain. 03/18/18   Law, Waylan BogaAlexandra M, PA-C  potassium chloride SA (K-DUR,KLOR-CON) 20 MEQ tablet Take 1 tablet (20 mEq total) by mouth daily. 07/28/18   Petrucelli, Samantha R, PA-C  predniSONE (DELTASONE) 20 MG tablet Take 2 tablets (40 mg total) by mouth daily. 06/27/18   Gwyneth SproutPlunkett, Whitney, MD  prochlorperazine (COMPAZINE) 10 MG tablet Take 1 tablet (10 mg total) by mouth 2 (two) times daily as needed for nausea or vomiting. 03/12/18   Tegeler, Canary Brimhristopher J, MD  Triamcinolone Acetonide (TRIAMCINOLONE 0.1 % CREAM : EUCERIN) CREA Apply 1 application topically 3 (three) times daily as needed. 1:1 concentration 06/27/18   Gwyneth SproutPlunkett, Whitney, MD    Family History Family History  Problem Relation Age of Onset  .  Muscular dystrophy Father   . Hypertension Father     Social History Social History   Tobacco Use  . Smoking status: Never Smoker  . Smokeless tobacco: Never Used  Substance Use Topics  . Alcohol use: No  . Drug use: No     Allergies   Hydrocodone, Penicillins, and Percocet [oxycodone-acetaminophen]   Review of Systems Review of Systems  Constitutional: Negative for chills and fever.  HENT: Negative for ear pain and sore throat.   Eyes: Negative for pain and visual disturbance.  Respiratory: Negative for cough and shortness of breath.   Cardiovascular: Negative for chest pain and palpitations.  Gastrointestinal: Negative for abdominal pain and vomiting.  Genitourinary: Negative for dysuria and hematuria.  Musculoskeletal: Negative for arthralgias and back pain.  Skin: Negative for color change and rash.  Neurological: Positive for dizziness and headaches. Negative for seizures and syncope.  All other systems reviewed and are negative.    Physical Exam Updated Vital Signs  ED Triage Vitals  Enc Vitals Group     BP 12/15/18 2107 106/82     Pulse Rate 12/15/18 2107 88     Resp 12/15/18 2107 18     Temp 12/15/18 2107 99.4 F (37.4 C)     Temp Source 12/15/18 2107 Oral     SpO2 12/15/18 2107 100 %     Weight 12/15/18 2107 202 lb (91.6 kg)     Height 12/15/18 2107 5\' 1"  (1.549 m)     Head Circumference --      Peak Flow --      Pain Score 12/15/18 2105 6     Pain Loc --      Pain Edu? --      Excl. in GC? --     Physical Exam Vitals signs and nursing note reviewed.  Constitutional:      General: She is not in acute distress.    Appearance: She is well-developed. She is not ill-appearing.  HENT:     Head: Normocephalic and atraumatic.     Mouth/Throat:     Mouth: Mucous membranes are moist.     Pharynx: Oropharynx is clear.  Eyes:     General: No visual field deficit.    Extraocular Movements: Extraocular movements intact.     Right eye: Normal  extraocular motion and no nystagmus.     Left eye: Normal extraocular motion and no nystagmus.     Conjunctiva/sclera: Conjunctivae normal.     Pupils: Pupils are equal, round, and reactive to light.     Comments: Pale conjuctiva  Neck:     Musculoskeletal: Normal range  of motion and neck supple.  Cardiovascular:     Rate and Rhythm: Normal rate and regular rhythm.     Heart sounds: Normal heart sounds. No murmur.  Pulmonary:     Effort: Pulmonary effort is normal. No respiratory distress.     Breath sounds: Normal breath sounds.  Abdominal:     Palpations: Abdomen is soft.     Tenderness: There is no abdominal tenderness.  Lymphadenopathy:     Cervical: No cervical adenopathy.  Skin:    General: Skin is warm and dry.  Neurological:     Mental Status: She is alert and oriented to person, place, and time.     Cranial Nerves: No cranial nerve deficit or dysarthria.     Sensory: No sensory deficit.     Motor: No weakness.     Comments: Positive Dix-Hallpike to the left, 5+ out of 5 strength, normal gait, normal sensation, normal finger-nose-finger, no visual field deficit  Psychiatric:        Mood and Affect: Mood normal.      ED Treatments / Results  Labs (all labs ordered are listed, but only abnormal results are displayed) Labs Reviewed  CBC WITH DIFFERENTIAL/PLATELET - Abnormal; Notable for the following components:      Result Value   WBC 11.4 (*)    MCH 25.4 (*)    All other components within normal limits  BASIC METABOLIC PANEL - Abnormal; Notable for the following components:   Potassium 3.4 (*)    Calcium 8.7 (*)    All other components within normal limits  PREGNANCY, URINE    EKG EKG Interpretation  Date/Time:  Tuesday December 15 2018 21:48:56 EDT Ventricular Rate:  102 PR Interval:    QRS Duration: 91 QT Interval:  337 QTC Calculation: 439 R Axis:   86 Text Interpretation:  Sinus tachycardia Confirmed by Lennice Sites [id number]) on 12/15/2018 9:53:37 PM    Radiology Ct Head Wo Contrast  Result Date: 12/15/2018 CLINICAL DATA:  Headaches and dizziness EXAM: CT HEAD WITHOUT CONTRAST TECHNIQUE: Contiguous axial images were obtained from the base of the skull through the vertex without intravenous contrast. COMPARISON:  None. FINDINGS: Brain: There is no mass, hemorrhage or extra-axial collection. The size and configuration of the ventricles and extra-axial CSF spaces are normal. The brain parenchyma is normal, without acute or chronic infarction. Vascular: No abnormal hyperdensity of the major intracranial arteries or dural venous sinuses. No intracranial atherosclerosis. Skull: The visualized skull base, calvarium and extracranial soft tissues are normal. Sinuses/Orbits: No fluid levels or advanced mucosal thickening of the visualized paranasal sinuses. No mastoid or middle ear effusion. The orbits are normal. IMPRESSION: Normal head CT. Electronically Signed   By: Ulyses Jarred M.D.   On: 12/15/2018 22:09    Procedures Procedures (including critical care time)  Medications Ordered in ED Medications  prochlorperazine (COMPAZINE) injection 10 mg (10 mg Intravenous Given 12/15/18 2144)     Initial Impression / Assessment and Plan / ED Course  I have reviewed the triage vital signs and the nursing notes.  Pertinent labs & imaging results that were available during my care of the patient were reviewed by me and considered in my medical decision making (see chart for details).     LULAR LETSON is a 28 year old female history of muscular dystrophy who presents to the ED with dizziness, headache.  Symptoms have been intermittent for a week.  Patient with normal vitals.  No fever.  Patient states symptoms are worse  when she lies flat.  Appears to have positive Dix-Hallpike to the left. No nystagmus. Has been taking meclizine that is prescribed to her father with some relief.  States that she was told that she had earwax today at primary care doctor's  office.  Patient has normal neurological exam otherwise.  No visual field deficits.  Normal finger-to-nose finger.  Normal gait.  Patient has a history of anemia and she does appear to have some pale conjunctiva.  Denies any heavy menstrual cycles.  Used to be on iron tablets.  We will get basic labs to rule out electrolyte abnormality, anemia.  Will get a head CT given his symptoms have been ongoing for about a week.  Patient with no significant anemia, electrolyte abnormality, kidney injury.  Pregnancy test is negative.  Head CT is unremarkable.  Patient felt better after Compazine, IV fluids.  Given prescription for meclizine. Likely vertigo.  Recommend follow-up primary care doctor and discharged from ED in good condition.  This chart was dictated using voice recognition software.  Despite best efforts to proofread,  errors can occur which can change the documentation meaning.    Final Clinical Impressions(s) / ED Diagnoses   Final diagnoses:  Dizziness    ED Discharge Orders         Ordered    meclizine (ANTIVERT) 25 MG tablet  3 times daily PRN     12/15/18 2226           Virgina NorfolkCuratolo, Kimberley Dastrup, DO 12/15/18 2226

## 2018-12-15 NOTE — ED Triage Notes (Addendum)
C/o HA,dizziness x 1 week-states she was seen by PCP dx with "wax in my ear"-states is worse when she lies down-states has hx of vertigo-NAD-to triage in w/c

## 2018-12-15 NOTE — ED Notes (Signed)
Patient transported to CT 

## 2018-12-15 NOTE — ED Notes (Signed)
ED Provider at bedside. 

## 2018-12-23 ENCOUNTER — Other Ambulatory Visit: Payer: Self-pay

## 2018-12-23 ENCOUNTER — Encounter (HOSPITAL_BASED_OUTPATIENT_CLINIC_OR_DEPARTMENT_OTHER): Payer: Self-pay

## 2018-12-23 ENCOUNTER — Emergency Department (HOSPITAL_BASED_OUTPATIENT_CLINIC_OR_DEPARTMENT_OTHER)
Admission: EM | Admit: 2018-12-23 | Discharge: 2018-12-24 | Disposition: A | Discharge status: Home / Self Care | Payer: Medicaid Other | Attending: Emergency Medicine | Admitting: Emergency Medicine

## 2018-12-23 DIAGNOSIS — S299XXA Unspecified injury of thorax, initial encounter: Secondary | ICD-10-CM | POA: Diagnosis present

## 2018-12-23 DIAGNOSIS — Y998 Other external cause status: Secondary | ICD-10-CM | POA: Insufficient documentation

## 2018-12-23 DIAGNOSIS — Y9389 Activity, other specified: Secondary | ICD-10-CM | POA: Insufficient documentation

## 2018-12-23 DIAGNOSIS — Z79899 Other long term (current) drug therapy: Secondary | ICD-10-CM | POA: Insufficient documentation

## 2018-12-23 DIAGNOSIS — Y92410 Unspecified street and highway as the place of occurrence of the external cause: Secondary | ICD-10-CM | POA: Insufficient documentation
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
## 2018-12-24 MED ORDER — NAPROXEN 250 MG PO TABS
500.0000 mg | ORAL_TABLET | Freq: Once | ORAL | Status: AC
  Administered 2018-12-24: 500 mg via ORAL
  Filled 2018-12-24: qty 2

## 2018-12-24 MED ORDER — NAPROXEN 500 MG PO TABS: 500.0000 mg | ORAL_TABLET | Freq: Two times a day (BID) | ORAL | 0 refills | Status: DC | PRN
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
## 2019-02-09 ENCOUNTER — Emergency Department (HOSPITAL_BASED_OUTPATIENT_CLINIC_OR_DEPARTMENT_OTHER)
Admission: EM | Admit: 2019-02-09 | Discharge: 2019-02-09 | Disposition: A | Discharge status: Home / Self Care | Payer: Medicaid Other | Attending: Emergency Medicine | Admitting: Emergency Medicine

## 2019-02-09 ENCOUNTER — Other Ambulatory Visit: Payer: Self-pay

## 2019-02-09 ENCOUNTER — Encounter (HOSPITAL_BASED_OUTPATIENT_CLINIC_OR_DEPARTMENT_OTHER): Payer: Self-pay

## 2019-02-09 DIAGNOSIS — Z5321 Procedure and treatment not carried out due to patient leaving prior to being seen by health care provider: Secondary | ICD-10-CM

## 2019-02-09 DIAGNOSIS — R51 Headache: Secondary | ICD-10-CM | POA: Insufficient documentation

## 2019-02-09 NOTE — ED Provider Notes (Signed)
Patient left without being seen  1. Patient left without being seen       Taelar, [place], DO 02/09/19 2250

## 2019-02-09 NOTE — ED Notes (Signed)
Pt is c/o pain to her scalp that started last night after she removed some hair extensions

## 2019-02-09 NOTE — ED Notes (Signed)
Pt still not back in room.

## 2019-02-09 NOTE — ED Triage Notes (Signed)
Pt c/o pain to top of head/scalp after taking hair extensions out last night-NAD-steady gait

## 2019-02-09 NOTE — ED Notes (Signed)
Pt has not returned to room since going to her car.

## 2019-02-17 ENCOUNTER — Encounter (HOSPITAL_BASED_OUTPATIENT_CLINIC_OR_DEPARTMENT_OTHER): Payer: Self-pay | Admitting: Emergency Medicine

## 2019-02-17 ENCOUNTER — Emergency Department (HOSPITAL_BASED_OUTPATIENT_CLINIC_OR_DEPARTMENT_OTHER)
Admission: EM | Admit: 2019-02-17 | Discharge: 2019-02-17 | Disposition: A | Discharge status: Home / Self Care | Payer: Medicaid Other | Attending: Emergency Medicine | Admitting: Emergency Medicine

## 2019-02-17 ENCOUNTER — Emergency Department (HOSPITAL_BASED_OUTPATIENT_CLINIC_OR_DEPARTMENT_OTHER): Payer: Medicaid Other

## 2019-02-17 ENCOUNTER — Other Ambulatory Visit: Payer: Self-pay

## 2019-02-17 DIAGNOSIS — R05 Cough: Secondary | ICD-10-CM | POA: Insufficient documentation

## 2019-02-17 DIAGNOSIS — F17210 Nicotine dependence, cigarettes, uncomplicated: Secondary | ICD-10-CM | POA: Insufficient documentation

## 2019-02-17 DIAGNOSIS — R0789 Other chest pain: Secondary | ICD-10-CM | POA: Diagnosis not present

## 2019-02-17 DIAGNOSIS — R079 Chest pain, unspecified: Secondary | ICD-10-CM | POA: Diagnosis present

## 2019-02-17 LAB — CBC WITH DIFFERENTIAL/PLATELET
Abs Immature Granulocytes: 0.04 10*3/uL (ref 0.00–0.07)
Basophils Absolute: 0 10*3/uL (ref 0.0–0.1)
Basophils Relative: 0 %
Eosinophils Absolute: 0.1 10*3/uL (ref 0.0–0.5)
Eosinophils Relative: 1 %
HCT: 37.6 % (ref 36.0–46.0)
Hemoglobin: 11.6 g/dL — ABNORMAL LOW (ref 12.0–15.0)
Immature Granulocytes: 0 %
Lymphocytes Relative: 36 %
Lymphs Abs: 3.8 10*3/uL (ref 0.7–4.0)
MCH: 24.9 pg — ABNORMAL LOW (ref 26.0–34.0)
MCHC: 30.9 g/dL (ref 30.0–36.0)
MCV: 80.9 fL (ref 80.0–100.0)
Monocytes Absolute: 0.7 10*3/uL (ref 0.1–1.0)
Monocytes Relative: 7 %
Neutro Abs: 5.9 10*3/uL (ref 1.7–7.7)
Neutrophils Relative %: 56 %
Platelets: 238 10*3/uL (ref 150–400)
RBC: 4.65 MIL/uL (ref 3.87–5.11)
RDW: 14.2 % (ref 11.5–15.5)
WBC: 10.6 10*3/uL — ABNORMAL HIGH (ref 4.0–10.5)
nRBC: 0 % (ref 0.0–0.2)

## 2019-02-17 LAB — BASIC METABOLIC PANEL
Anion gap: 10 (ref 5–15)
BUN: 11 mg/dL (ref 6–20)
CO2: 23 mmol/L (ref 22–32)
Calcium: 8.7 mg/dL — ABNORMAL LOW (ref 8.9–10.3)
Chloride: 103 mmol/L (ref 98–111)
Creatinine, Ser: 0.51 mg/dL (ref 0.44–1.00)
GFR calc Af Amer: >60 mL/min (ref >60)
GFR calc non Af Amer: >60 mL/min (ref >60)
Glucose, Bld: 128 mg/dL — ABNORMAL HIGH (ref 70–99)
Potassium: 3.5 mmol/L (ref 3.5–5.1)
Sodium: 136 mmol/L (ref 135–145)

## 2019-02-17 LAB — TROPONIN I (HIGH SENSITIVITY): Troponin I (High Sensitivity): <2 ng/L (ref <18)

## 2019-02-17 LAB — PREGNANCY, URINE: Preg Test, Ur: NEGATIVE

## 2019-02-17 LAB — D-DIMER, QUANTITATIVE: D-Dimer, Quant: 0.49 ug/mL-FEU (ref 0.00–0.50)

## 2019-02-17 MED ORDER — PANTOPRAZOLE SODIUM 20 MG PO TBEC: 20.0000 mg | DELAYED_RELEASE_TABLET | Freq: Every day | ORAL | 0 refills | Status: DC

## 2019-02-17 MED ORDER — ALUM & MAG HYDROXIDE-SIMETH 200-200-20 MG/5ML PO SUSP
30.0000 mL | Freq: Once | ORAL | Status: AC
  Administered 2019-02-17: 20:00:00 30 mL via ORAL
  Filled 2019-02-17: qty 30

## 2019-02-17 MED ORDER — LIDOCAINE VISCOUS HCL 2 % MT SOLN
15.0000 mL | Freq: Once | OROMUCOSAL | Status: AC
  Administered 2019-02-17: 20:00:00 15 mL via ORAL
  Filled 2019-02-17: qty 15

## 2019-02-17 NOTE — ED Notes (Signed)
Patient transported to X-ray 

## 2019-02-17 NOTE — ED Triage Notes (Signed)
Patient arrived via :POV c/o chest pain located in mid chest, no radiation, starting with cough. Patient states "it hurts every time I clear my throat." Patient states pain started 2 days prior. Patient states pain feels like pressure. Patient is AO x 4, VS WDL, gait normal.

## 2019-02-17 NOTE — ED Provider Notes (Signed)
[facility] EMERGENCY DEPARTMENT Provider Note   CSN: [id number] Arrival date & time: 02/17/19  1913     History   Chief Complaint Chief Complaint  Patient presents with  . Chest Pain    HPI Elizabeth Hooper is a 28 y.o. female.     The history is provided by the patient.  Chest Pain Pain location:  Substernal area Pain quality: burning   Pain radiates to:  Does not radiate Pain severity:  Mild Onset quality:  Gradual Timing:  Intermittent Progression:  Waxing and waning Chronicity:  New Context: eating   Relieved by:  Nothing Worsened by:  Nothing Associated symptoms: cough   Associated symptoms: no abdominal pain, no back pain, no fever, no palpitations, no shortness of breath and no vomiting   Risk factors: obesity and prior DVT/PE (maybe?)   Risk factors: no coronary artery disease, no diabetes mellitus, no high cholesterol, no hypertension and not pregnant     Past Medical History:  Diagnosis Date  . Muscular dystrophy (HCC)   . UTI (urinary tract infection) during pregnancy   . Vertigo     Patient Active Problem List   Diagnosis Date Noted  . Normal labor 09/29/2013  . Supervision of high risk pregnancy in third trimester 07/22/2013  . 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 Previous cesarean section complicating pregnancy 07/22/2013  . GBS bacteriuria 07/16/2013    Past Surgical History:  Procedure Laterality Date  . CESAREAN SECTION    . CESAREAN SECTION N/A 09/27/2013   Procedure: CESAREAN SECTION WITH BILATERAL TUBAL LIGATION;  Surgeon: Lesly Dukes, MD;  Location: [facility];  Service: Obstetrics;  Laterality: N/A;  . TUBAL LIGATION       OB History    Gravida  2   Para  2   Term  2   Preterm  0   AB      Living  2     SAB      TAB      Ectopic      Multiple      Live Births  2            Home Medications    Prior to Admission medications   Medication Sig Start Date End Date Taking? Authorizing Provider   meclizine (ANTIVERT) 25 MG tablet Take 1 tablet (25 mg total) by mouth 3 (three) times daily as needed for up to 20 doses for dizziness. 12/15/18   Tamar Lipscomb, DO  naproxen (NAPROSYN) 500 MG tablet Take 1 tablet (500 mg total) by mouth 2 (two) times daily as needed (for pain). 12/24/18   Molpus, John, MD  pantoprazole (PROTONIX) 20 MG tablet Take 1 tablet (20 mg total) by mouth daily. 02/17/19 03/19/19  Avian Greenawalt, DO  cetirizine (ZYRTEC ALLERGY) 10 MG tablet Take 1 tablet (10 mg total) by mouth daily. 06/27/18 12/24/18  Gwyneth Sprout, MD  diphenhydrAMINE (BENADRYL) 25 MG tablet Take 1 tablet (25 mg total) by mouth every 8 (eight) hours as needed for itching. 06/19/18 12/24/18  Henderly, Britni A, PA-C  famotidine (PEPCID) 20 MG tablet Take 1 tablet (20 mg total) by mouth 2 (two) times daily. 06/19/18 12/24/18  Henderly, Britni A, PA-C  fluticasone (FLONASE) 50 MCG/ACT nasal spray Place 2 sprays into both nostrils daily. 06/04/18 12/24/18  Michela Pitcher A, PA-C  potassium chloride SA (K-DUR,KLOR-CON) 20 MEQ tablet Take 1 tablet (20 mEq total) by mouth daily. 07/28/18 12/24/18  Petrucelli, Samantha R, PA-C  prochlorperazine (COMPAZINE) 10  MG tablet Take 1 tablet (10 mg total) by mouth 2 (two) times daily as needed for nausea or vomiting. 03/12/18 12/24/18  Tegeler, Gwenyth Allegra, MD    Family History Family History  Problem Relation Age of Onset  . Muscular dystrophy Father   . Hypertension Father     Social History Social History   Tobacco Use  . Smoking status: Current Some Day Smoker    Types: Cigarettes  . Smokeless tobacco: Never Used  Substance Use Topics  . Alcohol use: Yes    Comment: occ  . Drug use: No     Allergies   Hydrocodone, Penicillins, and Percocet [oxycodone-acetaminophen]   Review of Systems Review of Systems  Constitutional: Negative for chills and fever.  HENT: Negative for ear pain and sore throat.   Eyes: Negative for pain and visual disturbance.   Respiratory: Positive for cough. Negative for shortness of breath.   Cardiovascular: Positive for chest pain. Negative for palpitations.  Gastrointestinal: Negative for abdominal pain and vomiting.  Genitourinary: Negative for dysuria and hematuria.  Musculoskeletal: Negative for arthralgias and back pain.  Skin: Negative for color change and rash.  Neurological: Negative for seizures and syncope.  All other systems reviewed and are negative.    Physical Exam Updated Vital Signs BP 120/70 (BP Location: Right Arm)   Temp 99 F (37.2 C) (Oral)   Resp 18   Ht 5\' 1"  (1.549 m)   Wt 95.7 kg   LMP 02/17/2019 (Exact Date)   SpO2 100%   BMI 39.87 kg/m   Physical Exam Vitals signs and nursing note reviewed.  Constitutional:      General: She is not in acute distress.    Appearance: She is well-developed.  HENT:     Head: Normocephalic and atraumatic.  Eyes:     Extraocular Movements: Extraocular movements intact.     Conjunctiva/sclera: Conjunctivae normal.     Pupils: Pupils are equal, round, and reactive to light.  Neck:     Musculoskeletal: Neck supple.  Cardiovascular:     Rate and Rhythm: Normal rate and regular rhythm.     Pulses:          Radial pulses are 2+ on the right side and 2+ on the left side.     Heart sounds: Normal heart sounds. No murmur.  Pulmonary:     Effort: Pulmonary effort is normal. No respiratory distress.     Breath sounds: Normal breath sounds. No decreased breath sounds, wheezing, rhonchi or rales.  Chest:     Chest wall: No tenderness.  Abdominal:     Palpations: Abdomen is soft.     Tenderness: There is no abdominal tenderness.  Musculoskeletal: Normal range of motion.     Right lower leg: No edema.     Left lower leg: No edema.  Skin:    General: Skin is warm and dry.     Capillary Refill: Capillary refill takes less than 2 seconds.  Neurological:     General: No focal deficit present.     Mental Status: She is alert.  Psychiatric:         Mood and Affect: Mood normal.      ED Treatments / Results  Labs (all labs ordered are listed, but only abnormal results are displayed) Labs Reviewed  CBC WITH DIFFERENTIAL/PLATELET - Abnormal; Notable for the following components:      Result Value   WBC 10.6 (*)    Hemoglobin 11.6 (*)    [facility]  24.9 (*)    All other components within normal limits  BASIC METABOLIC PANEL - Abnormal; Notable for the following components:   Glucose, Bld 128 (*)    Calcium 8.7 (*)    All other components within normal limits  PREGNANCY, URINE  D-DIMER, QUANTITATIVE (NOT AT [facility])  TROPONIN I (HIGH SENSITIVITY)    EKG EKG Interpretation  Date/Time:  Wednesday February 17 2019 19:55:25 EDT Ventricular Rate:  89 PR Interval:    QRS Duration: 91 QT Interval:  358 QTC Calculation: 436 R Axis:   81 Text Interpretation:  Sinus rhythm Baseline wander in lead(s) II III aVF V3 Confirmed by Virgina NorfolkAdam, Rashawd Laskaris [phone]([id number]) on 02/17/2019 8:10:16 PM   Radiology Dg Chest 2 View  Result Date: 02/17/2019 CLINICAL DATA:  28 year old female with chest pain. EXAM: CHEST - 2 VIEW COMPARISON:  Chest radiograph dated 06/04/2018 FINDINGS: The heart size and mediastinal contours are within normal limits. Both lungs are clear. The visualized skeletal structures are unremarkable. IMPRESSION: No active cardiopulmonary disease. Electronically Signed   By: Elgie CollardArash  Radparvar M.D.   On: 02/17/2019 20:02    Procedures Procedures (including critical care time)  Medications Ordered in ED Medications  alum & mag hydroxide-simeth (MAALOX/MYLANTA) 200-200-20 MG/5ML suspension 30 mL (30 mLs Oral Given 02/17/19 2019)    And  lidocaine (XYLOCAINE) 2 % viscous mouth solution 15 mL (15 mLs Oral Given 02/17/19 2019)     Initial Impression / Assessment and Plan / ED Course  I have reviewed the triage vital signs and the nursing notes.  Pertinent labs & imaging results that were available during my care of the patient were reviewed by me  and considered in my medical decision making (see chart for details).       Elizabeth Hooper is a 28 year old female history of muscular dystrophy who presents to the ED with chest pain.  Patient with normal vitals.  No fever.  Patient with sinus rhythm on EKG.  No ischemic changes.  Troponin normal.  Does not have cardiac sounding chest pain.  No cardiac risk factors.  Doubt ACS.  D-dimer was negative and doubt PE.  Patient had chest x-ray that showed no signs of pneumonia, no pneumothorax, no pleural effusion.  Patient felt better after GI cocktail.  She described the pain as a burning pain that was worse with eating and worse when she lies down at night.  She has had a dry cough as well.  Suspect possibly some reflux.  No significant anemia, electrolyte abnormality, kidney injury.  Overall likely GI related or musculoskeletal in nature.  No infection was found on chest x-ray.  Doubt cardiac or pulmonary process.  Will prescribe Protonix.  Recommend follow-up with primary care doctor and told to return to the ED if symptoms worsen.  This chart was dictated using voice recognition software.  Despite best efforts to proofread,  errors can occur which can change the documentation meaning.    Final Clinical Impressions(s) / ED Diagnoses   Final diagnoses:  Nonspecific chest pain    ED Discharge Orders         Ordered    pantoprazole (PROTONIX) 20 MG tablet  Daily     02/17/19 2046           Virgina NorfolkCuratolo, Sylvi Rybolt, DO 02/17/19 2049

## 2019-03-20 ENCOUNTER — Encounter (HOSPITAL_BASED_OUTPATIENT_CLINIC_OR_DEPARTMENT_OTHER): Payer: Self-pay | Admitting: Emergency Medicine

## 2019-03-20 ENCOUNTER — Emergency Department (HOSPITAL_BASED_OUTPATIENT_CLINIC_OR_DEPARTMENT_OTHER)
Admission: EM | Admit: 2019-03-20 | Discharge: 2019-03-20 | Disposition: A | Discharge status: Home / Self Care | Payer: Medicaid Other | Attending: Emergency Medicine | Admitting: Emergency Medicine

## 2019-03-20 ENCOUNTER — Other Ambulatory Visit: Payer: Self-pay

## 2019-03-20 DIAGNOSIS — Z5321 Procedure and treatment not carried out due to patient leaving prior to being seen by health care provider: Secondary | ICD-10-CM | POA: Insufficient documentation

## 2019-03-20 DIAGNOSIS — J029 Acute pharyngitis, unspecified: Secondary | ICD-10-CM | POA: Insufficient documentation

## 2019-03-20 DIAGNOSIS — R05 Cough: Secondary | ICD-10-CM | POA: Diagnosis present

## 2019-03-20 NOTE — ED Notes (Signed)
Patient told registration that she had to leave and she may come back

## 2019-03-20 NOTE — ED Triage Notes (Signed)
Patient states that she has a cough and a sore throat. The pain started today

## 2019-03-21 ENCOUNTER — Emergency Department (HOSPITAL_BASED_OUTPATIENT_CLINIC_OR_DEPARTMENT_OTHER)
Admission: EM | Admit: 2019-03-21 | Discharge: 2019-03-21 | Disposition: A | Discharge status: Home / Self Care | Payer: Medicaid Other | Attending: Emergency Medicine | Admitting: Emergency Medicine

## 2019-03-21 ENCOUNTER — Encounter (HOSPITAL_BASED_OUTPATIENT_CLINIC_OR_DEPARTMENT_OTHER): Payer: Self-pay | Admitting: Emergency Medicine

## 2019-03-21 DIAGNOSIS — F17210 Nicotine dependence, cigarettes, uncomplicated: Secondary | ICD-10-CM | POA: Insufficient documentation

## 2019-03-21 DIAGNOSIS — Z880 Allergy status to penicillin: Secondary | ICD-10-CM | POA: Diagnosis not present

## 2019-03-21 DIAGNOSIS — J069 Acute upper respiratory infection, unspecified: Secondary | ICD-10-CM | POA: Diagnosis not present

## 2019-03-21 DIAGNOSIS — G7100 Muscular dystrophy, unspecified: Secondary | ICD-10-CM | POA: Insufficient documentation

## 2019-03-21 DIAGNOSIS — Z885 Allergy status to narcotic agent status: Secondary | ICD-10-CM | POA: Insufficient documentation

## 2019-03-21 DIAGNOSIS — R05 Cough: Secondary | ICD-10-CM | POA: Diagnosis present

## 2019-03-21 DIAGNOSIS — Z79899 Other long term (current) drug therapy: Secondary | ICD-10-CM | POA: Diagnosis not present

## 2019-03-21 DIAGNOSIS — Z20828 Contact with and (suspected) exposure to other viral communicable diseases: Secondary | ICD-10-CM | POA: Diagnosis not present

## 2019-03-21 NOTE — ED Triage Notes (Signed)
Pt c/o cough with sneezing, N/V/D x 3 days. Pt reports fever. Pt reports "just not feeling well". Denies otc medications today. Pt also c/o abdominal pain and neck pain

## 2019-03-21 NOTE — ED Provider Notes (Signed)
[facility] EMERGENCY DEPARTMENT Provider Note   CSN: [id number] Arrival date & time: 03/21/19  1038     History   Chief Complaint Chief Complaint  Patient presents with  . Cough    HPI Elizabeth Hooper is a 28 y.o. female with no significant past medical history presents to the ED for a 3-day history of fatigue, cough, sneezing, rhinorrhea, sore throat, nausea, vomiting, diarrhea, and intermittent subjective fevers and chills.  She reports that her cough has been productive of clear mucus and she has occasional blood-tinged nasal discharge.  She denies any headache, ear pain, difficulty swallowing, diminished appetite, uncontrollable nausea vomiting, chest pain, shortness of breath, neck swelling or discomfort, visual deficits, urinary symptoms, abdominal pain, or other symptoms.  She has been taking DayQuil, with some relief.     HPI  Past Medical History:  Diagnosis Date  . Muscular dystrophy (HCC)   . UTI (urinary tract infection) during pregnancy   . Vertigo     Patient Active Problem List   Diagnosis Date Noted  . Normal labor 09/29/2013  . Supervision of high risk pregnancy in third trimester 07/22/2013  . 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 Previous cesarean section complicating pregnancy 07/22/2013  . GBS bacteriuria 07/16/2013    Past Surgical History:  Procedure Laterality Date  . CESAREAN SECTION    . CESAREAN SECTION N/A 09/27/2013   Procedure: CESAREAN SECTION WITH BILATERAL TUBAL LIGATION;  Surgeon: Lesly Dukes, MD;  Location: [facility];  Service: Obstetrics;  Laterality: N/A;  . TUBAL LIGATION       OB History    Gravida  2   Para  2   Term  2   Preterm  0   AB      Living  2     SAB      TAB      Ectopic      Multiple      Live Births  2            Home Medications    Prior to Admission medications   Medication Sig Start Date End Date Taking? Authorizing Provider  meclizine (ANTIVERT) 25 MG tablet Take 1 tablet  (25 mg total) by mouth 3 (three) times daily as needed for up to 20 doses for dizziness. 12/15/18   Curatolo, Adam, DO  naproxen (NAPROSYN) 500 MG tablet Take 1 tablet (500 mg total) by mouth 2 (two) times daily as needed (for pain). 12/24/18   Molpus, John, MD  pantoprazole (PROTONIX) 20 MG tablet Take 1 tablet (20 mg total) by mouth daily. 02/17/19 03/19/19  Curatolo, Adam, DO  cetirizine (ZYRTEC ALLERGY) 10 MG tablet Take 1 tablet (10 mg total) by mouth daily. 06/27/18 12/24/18  Gwyneth Sprout, MD  diphenhydrAMINE (BENADRYL) 25 MG tablet Take 1 tablet (25 mg total) by mouth every 8 (eight) hours as needed for itching. 06/19/18 12/24/18  Henderly, Britni A, PA-C  famotidine (PEPCID) 20 MG tablet Take 1 tablet (20 mg total) by mouth 2 (two) times daily. 06/19/18 12/24/18  Henderly, Britni A, PA-C  fluticasone (FLONASE) 50 MCG/ACT nasal spray Place 2 sprays into both nostrils daily. 06/04/18 12/24/18  Michela Pitcher A, PA-C  potassium chloride SA (K-DUR,KLOR-CON) 20 MEQ tablet Take 1 tablet (20 mEq total) by mouth daily. 07/28/18 12/24/18  Petrucelli, Pleas Koch, PA-C  prochlorperazine (COMPAZINE) 10 MG tablet Take 1 tablet (10 mg total) by mouth 2 (two) times daily as needed for nausea or vomiting. 03/12/18 12/24/18  Tegeler, Gwenyth Allegra, MD    Family History Family History  Problem Relation Age of Onset  . Muscular dystrophy Father   . Hypertension Father     Social History Social History   Tobacco Use  . Smoking status: Current Some Day Smoker    Types: Cigarettes  . Smokeless tobacco: Never Used  Substance Use Topics  . Alcohol use: Yes    Comment: occ  . Drug use: No     Allergies   Hydrocodone, Penicillins, and Percocet [oxycodone-acetaminophen]   Review of Systems Review of Systems  All other systems reviewed and are negative.    Physical Exam Updated Vital Signs BP (!) 105/48 (BP Location: Left Arm)   Pulse (!) 110   Temp 99.7 F (37.6 C) (Oral)   Resp 18   Ht 5\' 1"  (1.549  m)   Wt 96.9 kg   LMP 03/13/2019   SpO2 97%   BMI 40.36 kg/m   Physical Exam Vitals signs and nursing note reviewed. Exam conducted with a chaperone present.  Constitutional:      Appearance: Normal appearance.  HENT:     Head: Normocephalic and atraumatic.     Nose: Congestion and rhinorrhea present.     Mouth/Throat:     Pharynx: Oropharynx is clear.  Eyes:     General: No scleral icterus.    Conjunctiva/sclera: Conjunctivae normal.  Neck:     Musculoskeletal: Normal range of motion and neck supple. No neck rigidity or muscular tenderness.  Cardiovascular:     Rate and Rhythm: Normal rate and regular rhythm.  Pulmonary:     Effort: Pulmonary effort is normal. No respiratory distress.  Abdominal:     General: There is no distension.     Palpations: Abdomen is soft.     Tenderness: There is no guarding.  Skin:    General: Skin is dry.  Neurological:     Mental Status: She is alert.     GCS: GCS eye subscore is 4. GCS verbal subscore is 5. GCS motor subscore is 6.  Psychiatric:        Mood and Affect: Mood normal.        Behavior: Behavior normal.        Thought Content: Thought content normal.      ED Treatments / Results  Labs (all labs ordered are listed, but only abnormal results are displayed) Labs Reviewed - No data to display  EKG None  Radiology No results found.  Procedures Procedures (including critical care time)  Medications Ordered in ED Medications - No data to display   Initial Impression / Assessment and Plan / ED Course  I have reviewed the triage vital signs and the nursing notes.  Pertinent labs & imaging results that were available during my care of the patient were reviewed by me and considered in my medical decision making (see chart for details).        Patient states that her boyfriend's boss was recently diagnosed COVID-19 and that her two children are also symptomatic.  The youngest son also had been in school last week and  she states that he was the first one who was symptomatic.  She denies any difficulty breathing, document fevers, or uncontrollable nausea or vomiting.  She can tolerate food and liquid by mouth perfectly fine.  Her appetite is still intact.  I explained to her why blood work or chest x-ray is not warranted at this time and that her symptoms are likely part  of a viral prodrome.  She is hemodynamically stable.  She is mildly tachycardic and I encouraged her to increase her oral hydration.  We will swab her for COVID-19, as well as her two children.   Instructed patient to return to the ED or seek medical attention should they develop any fevers or chills unrelieved by Tylenol or ibuprofen, difficulty breathing, chest pain, uncontrollable nausea or vomiting, or any other new or worsening symptoms.  Encouraged her also invest in a thermometer for more accurate documentation.  Discussed other conservative therapies for her cold and flu symptoms that she may implement in addition to her continued DayQuil and NyQuil.  Patient voiced understanding is agreeable to plan.    Final Clinical Impressions(s) / ED Diagnoses   Final diagnoses:  Viral URI with cough    ED Discharge Orders    None       Lorelee NewGreen, Quenten Nawaz L, PA-C 03/21/19 1244    Alvira MondaySchlossman, Erin, MD 03/22/19 1208

## 2019-03-21 NOTE — Discharge Instructions (Addendum)
Please review attachment.  Instructed patient to return to the ED or seek medical attention should they develop any fevers or chills unrelieved by Tylenol or ibuprofen, difficulty breathing, chest pain, uncontrollable nausea or vomiting, or any other new or worsening symptoms.  Encouraged her also invest in a thermometer for more accurate documentation.  Discussed other conservative therapies for her cold and flu symptoms that she may implement in addition to her continued DayQuil and NyQuil.

## 2019-03-22 LAB — NOVEL CORONAVIRUS, NAA (HOSP ORDER, SEND-OUT TO REF LAB; TAT 18-24 HRS): SARS-CoV-2, NAA: NOT DETECTED

## 2019-04-20 IMAGING — CR DG HIP (WITH OR WITHOUT PELVIS) 2-3V*L*
3 series · 3 of 3 positions shown · non-contrast
Comparison: CT pelvis 10/06/2016

CLINICAL DATA: Left hip pain for 4 days.

EXAM:
DG HIP (WITH OR WITHOUT PELVIS) 2-3V LEFT

[t pelvis a.p.]
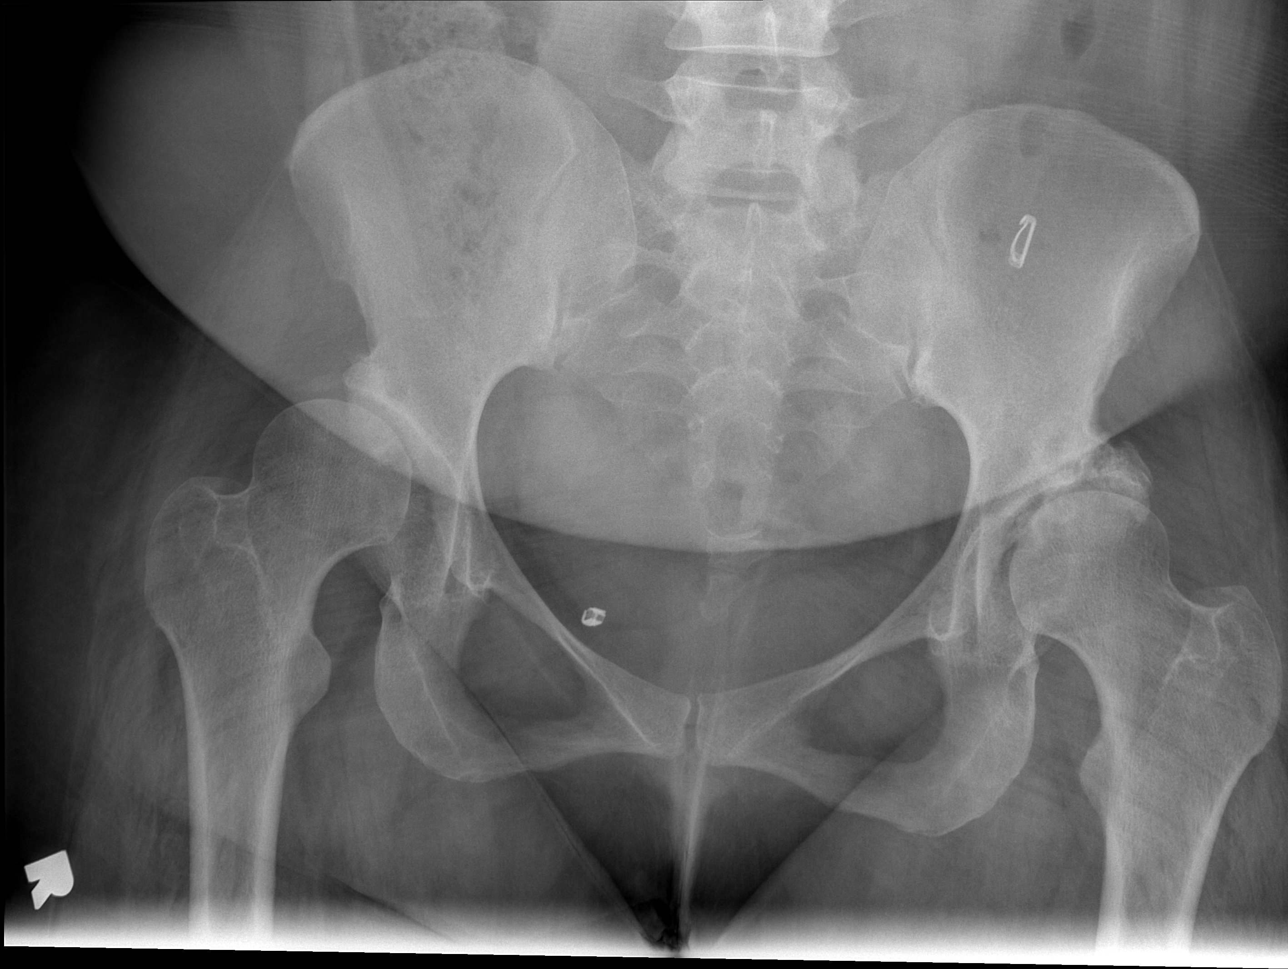

[t hip ap left]
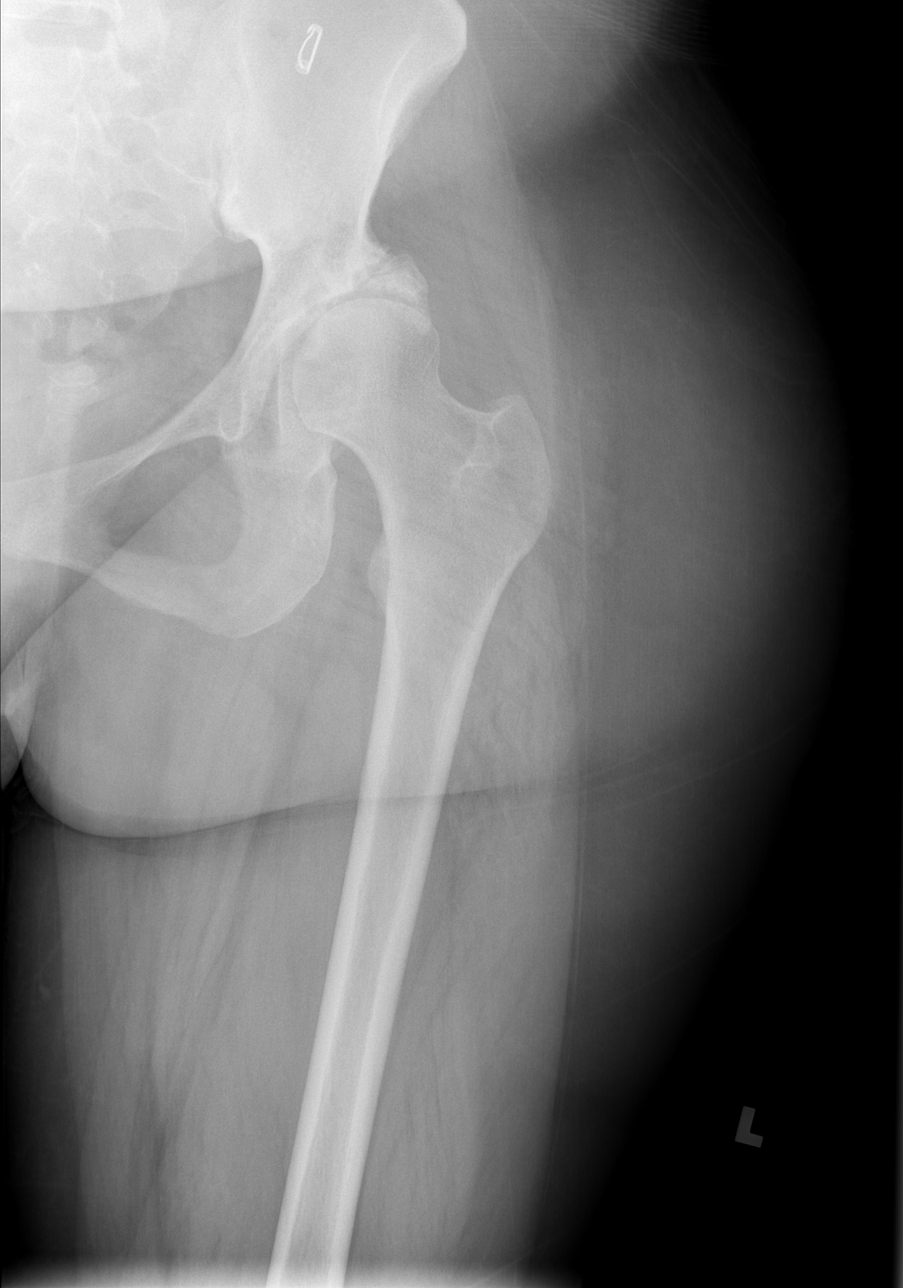

[t hip frog leg left]
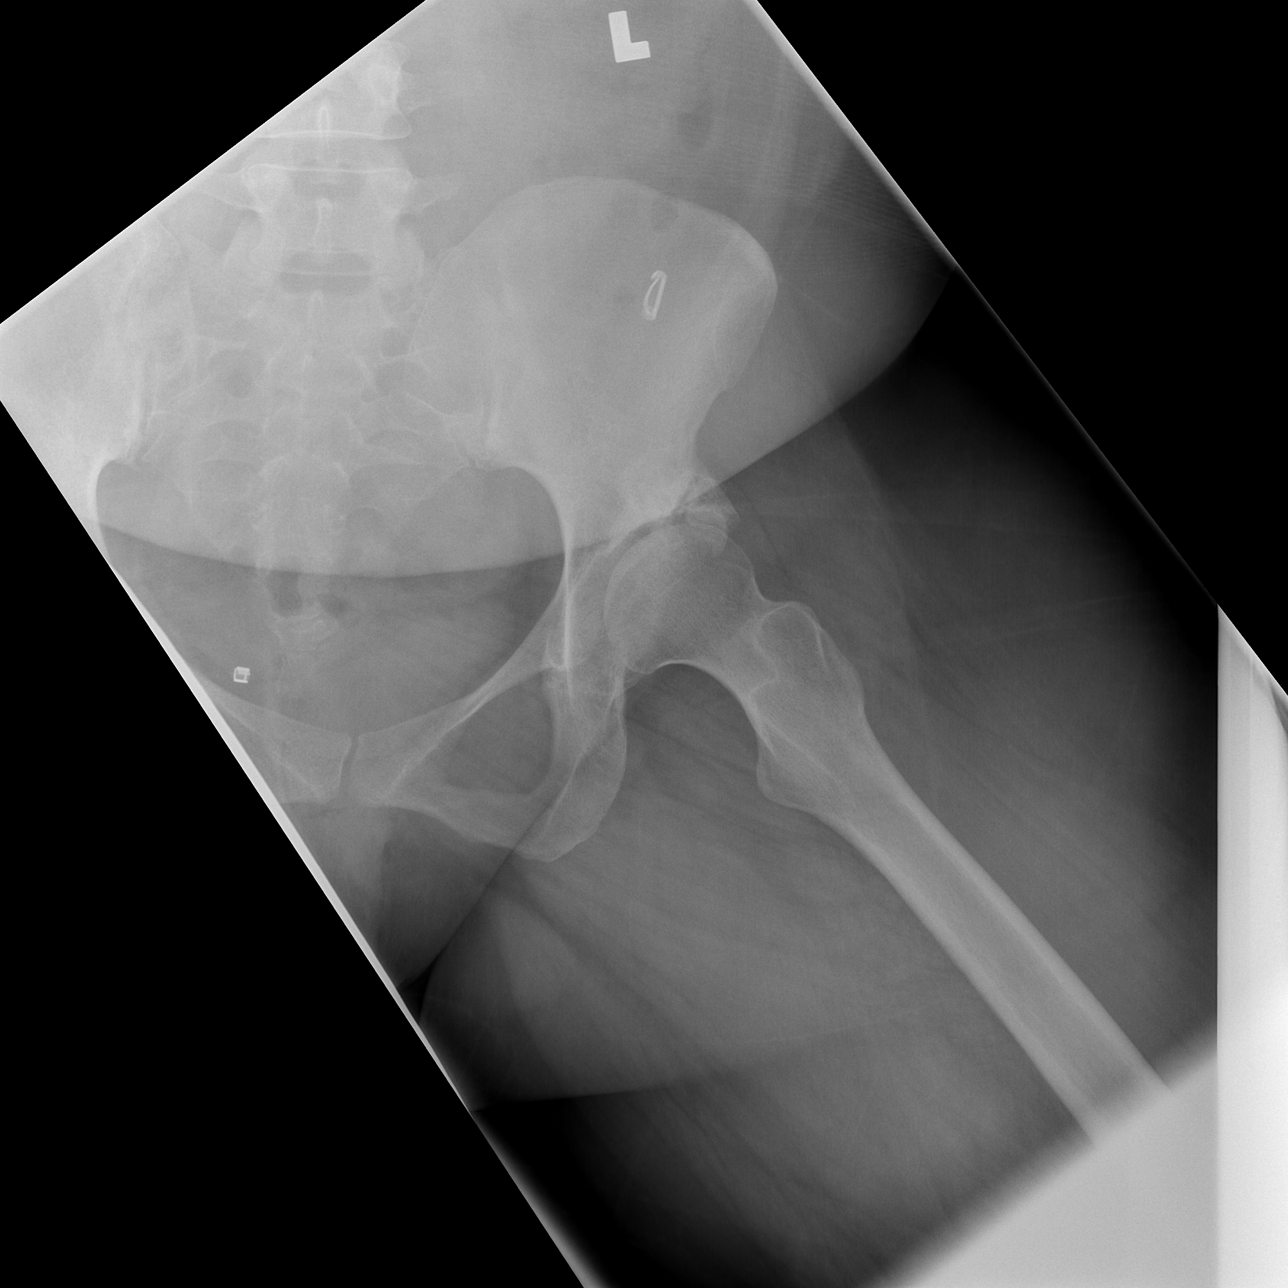

[3 of 3 positions shown; findings below may reference images not displayed]

FINDINGS: There is over 50% lateral uncovering of the right femoral head by
the acetabula compatible with dysplasia.

Bony overgrowth of the left upper acetabulum extending laterally
could be from a prior attempted correction or from proliferative
spurring. This appears mostly fragmented with cortication. There is
some subcortical cyst formation laterally in the left acetabulum
which is chronic.
IMPRESSION: 1. Dysplastic right hip with over 50% lateral uncovering.
2. The left hip demonstrates fragmented lateral prominent spurring
from the upper acetabulum which appears chronic and corticated, not
substantially different from 10/06/2016, with underlying subcortical
cyst formation and sclerosis in the acetabulum.

## 2019-05-13 ENCOUNTER — Encounter (HOSPITAL_BASED_OUTPATIENT_CLINIC_OR_DEPARTMENT_OTHER): Payer: Self-pay | Admitting: *Deleted

## 2019-05-13 ENCOUNTER — Other Ambulatory Visit: Payer: Self-pay

## 2019-05-13 ENCOUNTER — Emergency Department (HOSPITAL_BASED_OUTPATIENT_CLINIC_OR_DEPARTMENT_OTHER)
Admission: EM | Admit: 2019-05-13 | Discharge: 2019-05-13 | Disposition: A | Discharge status: Home / Self Care | Payer: Medicaid Other | Attending: Emergency Medicine | Admitting: Emergency Medicine

## 2019-05-13 DIAGNOSIS — Z5321 Procedure and treatment not carried out due to patient leaving prior to being seen by health care provider: Secondary | ICD-10-CM

## 2019-05-13 DIAGNOSIS — J029 Acute pharyngitis, unspecified: Secondary | ICD-10-CM | POA: Insufficient documentation

## 2019-05-13 LAB — GROUP A STREP BY PCR: Group A Strep by PCR: NOT DETECTED — AB

## 2019-05-13 NOTE — ED Triage Notes (Signed)
Pt c/o sore throat x 3 days , family at home with strep

## 2019-05-13 NOTE — ED Provider Notes (Signed)
Patient left without being seen after triage.  I did not participate in the care of this patient.   Elizabeth Hooper 05/13/19 2251    Ezequiel Essex, MD 05/13/19 2348

## 2019-05-25 ENCOUNTER — Emergency Department (HOSPITAL_BASED_OUTPATIENT_CLINIC_OR_DEPARTMENT_OTHER)
Admission: EM | Admit: 2019-05-25 | Discharge: 2019-05-25 | Disposition: A | Discharge status: Home / Self Care | Payer: Medicaid Other | Attending: Emergency Medicine | Admitting: Emergency Medicine

## 2019-05-25 ENCOUNTER — Other Ambulatory Visit: Payer: Self-pay

## 2019-05-25 ENCOUNTER — Encounter (HOSPITAL_BASED_OUTPATIENT_CLINIC_OR_DEPARTMENT_OTHER): Payer: Self-pay

## 2019-05-25 DIAGNOSIS — R519 Headache, unspecified: Secondary | ICD-10-CM | POA: Diagnosis not present

## 2019-05-25 DIAGNOSIS — Z79899 Other long term (current) drug therapy: Secondary | ICD-10-CM | POA: Diagnosis not present

## 2019-05-25 DIAGNOSIS — Z880 Allergy status to penicillin: Secondary | ICD-10-CM | POA: Insufficient documentation

## 2019-05-25 DIAGNOSIS — Z885 Allergy status to narcotic agent status: Secondary | ICD-10-CM | POA: Insufficient documentation

## 2019-05-25 DIAGNOSIS — G7100 Muscular dystrophy, unspecified: Secondary | ICD-10-CM | POA: Diagnosis not present

## 2019-05-25 MED ORDER — DIPHENHYDRAMINE HCL 25 MG PO TABS: 25.0000 mg | ORAL_TABLET | Freq: Four times a day (QID) | ORAL | 0 refills | Status: DC

## 2019-05-25 MED ORDER — PROMETHAZINE HCL 25 MG PO TABS: 25.0000 mg | ORAL_TABLET | Freq: Four times a day (QID) | ORAL | 0 refills | Status: DC | PRN

## 2019-05-25 MED FILL — PROMETHAZINE 25 MG TABLET: 25 | 3 days supply | Qty: 10 | Fill #0

## 2019-05-25 MED FILL — BANOPHEN 25 MG CAPSULE: 25 | 25 days supply | Qty: 100 | Fill #0

## 2019-05-25 NOTE — ED Provider Notes (Addendum)
[facility] EMERGENCY DEPARTMENT Provider Note   CSN: [id number] Arrival date & time: 05/25/19  1445     History Chief Complaint  Patient presents with  . Migraine    Elizabeth Hooper is a 28 y.o. female.  Patient with a complaint of headache to the top of head for about a week.  Right lateral neck discomfort no history of injury.  Patient is already been to [facility] and had CT done without evidence of any abnormal findings she thought initially it may have been due to sinus problems which has been the cause in the past.  Patient claims that she was told in the past she had migraines.  But the headache is not real severe.  There is no real nausea associated with it no photophobia not really throbbing.  No fevers no upper respiratory symptoms.  Patient did have a sore throat 2 weeks ago.  No posterior neck stiffness.  No extremity weakness or numbness.  No facial weakness no trouble speaking.        Past Medical History:  Diagnosis Date  . Muscular dystrophy (HCC)   . UTI (urinary tract infection) during pregnancy   . Vertigo     Patient Active Problem List   Diagnosis Date Noted  . Normal labor 09/29/2013  . Supervision of high risk pregnancy in third trimester 07/22/2013  . 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 Previous cesarean section complicating pregnancy 07/22/2013  . GBS bacteriuria 07/16/2013    Past Surgical History:  Procedure Laterality Date  . CESAREAN SECTION    . CESAREAN SECTION N/A 09/27/2013   Procedure: CESAREAN SECTION WITH BILATERAL TUBAL LIGATION;  Surgeon: Lesly DukesKelly H Leggett, MD;  Location: [facility];  Service: Obstetrics;  Laterality: N/A;  . TUBAL LIGATION       OB History    Gravida  2   Para  2   Term  2   Preterm  0   AB      Living  2     SAB      TAB      Ectopic      Multiple      Live Births  2           Family History  Problem Relation Age of Onset  . Muscular dystrophy Father   .  Hypertension Father     Social History   Tobacco Use  . Smoking status: Never Smoker  . Smokeless tobacco: Never Used  Substance Use Topics  . Alcohol use: Yes    Comment: occ  . Drug use: No    Home Medications Prior to Admission medications   Medication Sig Start Date End Date Taking? Authorizing Provider  diphenhydrAMINE (BENADRYL) 25 MG tablet Take 1 tablet (25 mg total) by mouth every 6 (six) hours. 05/25/19   Vanetta MuldersZackowski, Maythe Deramo, MD  meclizine (ANTIVERT) 25 MG tablet Take 1 tablet (25 mg total) by mouth 3 (three) times daily as needed for up to 20 doses for dizziness. 12/15/18   Curatolo, Adam, DO  naproxen (NAPROSYN) 500 MG tablet Take 1 tablet (500 mg total) by mouth 2 (two) times daily as needed (for pain). 12/24/18   Molpus, John, MD  pantoprazole (PROTONIX) 20 MG tablet Take 1 tablet (20 mg total) by mouth daily. 02/17/19 03/19/19  Curatolo, Adam, DO  promethazine (PHENERGAN) 25 MG tablet Take 1 tablet (25 mg total) by mouth every 6 (six) hours as needed for nausea or vomiting. 05/25/19  Vanetta Mulders, MD  cetirizine (ZYRTEC ALLERGY) 10 MG tablet Take 1 tablet (10 mg total) by mouth daily. 06/27/18 12/24/18  Gwyneth Sprout, MD  famotidine (PEPCID) 20 MG tablet Take 1 tablet (20 mg total) by mouth 2 (two) times daily. 06/19/18 12/24/18  Henderly, Britni A, PA-C  fluticasone (FLONASE) 50 MCG/ACT nasal spray Place 2 sprays into both nostrils daily. 06/04/18 12/24/18  Michela Pitcher A, PA-C  potassium chloride SA (K-DUR,KLOR-CON) 20 MEQ tablet Take 1 tablet (20 mEq total) by mouth daily. 07/28/18 12/24/18  Petrucelli, Pleas Koch, PA-C  prochlorperazine (COMPAZINE) 10 MG tablet Take 1 tablet (10 mg total) by mouth 2 (two) times daily as needed for nausea or vomiting. 03/12/18 12/24/18  Tegeler, Canary Brim, MD    Allergies    Hydrocodone, Penicillins, and Percocet [oxycodone-acetaminophen]  Review of Systems   Review of Systems  Constitutional: Negative for chills and fever.  HENT:  Negative for congestion, rhinorrhea and sore throat.   Eyes: Negative for visual disturbance.  Respiratory: Negative for cough and shortness of breath.   Cardiovascular: Negative for chest pain and leg swelling.  Gastrointestinal: Negative for abdominal pain, diarrhea, nausea and vomiting.  Genitourinary: Negative for dysuria.  Musculoskeletal: Positive for neck pain. Negative for back pain and neck stiffness.  Skin: Negative for rash.  Neurological: Positive for headaches. Negative for dizziness and light-headedness.  Hematological: Does not bruise/bleed easily.  Psychiatric/Behavioral: Negative for confusion.    Physical Exam Updated Vital Signs BP (!) 165/100 (BP Location: Left Arm)   Pulse 96   Temp 99.2 F (37.3 C)   Resp 20   Ht 1.549 m (5\' 1" )   Wt 99.8 kg   LMP 05/13/2019   SpO2 100%   BMI 41.57 kg/m   Physical Exam Vitals and nursing note reviewed.  Constitutional:      General: She is not in acute distress.    Appearance: Normal appearance. She is well-developed. She is not ill-appearing or toxic-appearing.  HENT:     Head: Normocephalic and atraumatic.  Eyes:     Extraocular Movements: Extraocular movements intact.     Conjunctiva/sclera: Conjunctivae normal.     Pupils: Pupils are equal, round, and reactive to light.  Cardiovascular:     Rate and Rhythm: Normal rate and regular rhythm.     Heart sounds: No murmur.  Pulmonary:     Effort: Pulmonary effort is normal. No respiratory distress.     Breath sounds: Normal breath sounds.  Abdominal:     Palpations: Abdomen is soft.     Tenderness: There is no abdominal tenderness.  Musculoskeletal:        General: No swelling. Normal range of motion.     Cervical back: Normal range of motion and neck supple. No rigidity.  Lymphadenopathy:     Cervical: No cervical adenopathy.  Skin:    General: Skin is warm and dry.  Neurological:     General: No focal deficit present.     Mental Status: She is alert and  oriented to person, place, and time.     Cranial Nerves: No cranial nerve deficit.     Sensory: No sensory deficit.     Motor: No weakness.     Coordination: Coordination abnormal.     Gait: Gait normal.     ED Results / Procedures / Treatments   Labs (all labs ordered are listed, but only abnormal results are displayed) Labs Reviewed - No data to display  EKG None  Radiology No results  found.  Procedures Procedures (including critical care time)  Medications Ordered in ED Medications - No data to display  ED Course  I have reviewed the triage vital signs and the nursing notes.  Pertinent labs & imaging results that were available during my care of the patient were reviewed by me and considered in my medical decision making (see chart for details).    MDM Rules/Calculators/A&P                      Headache not typical for a migraine headache.  We will give a trial with Phenergan Benadryl at times when she can rest.  She has a mother with 2 children to care for.  Patient nontoxic no acute distress.  No neck stiffness good range of motion of the neck no fall or injury.  No neuro deficits associated with this.  Already had a head CT done this week at [facility] which was reported as negative.  In particular no evidence of any sinus problems.  Chart review shows that patient was seen November 5 at the [facility] ED did have CT head at that time.  It was for the complaint of headache.  But patient insists it was just this week.  Does not want a repeat head CT.  Patient nontoxic no acute distress at least by chart review shows that she probably had headaches at that time as well.  The description sounds very similar to today's headache.   Final Clinical Impression(s) / ED Diagnoses Final diagnoses:  Acute nonintractable headache, unspecified headache type    Rx / DC Orders ED Discharge Orders         Ordered    diphenhydrAMINE (BENADRYL) 25 MG tablet  Every  6 hours     05/25/19 1538    promethazine (PHENERGAN) 25 MG tablet  Every 6 hours PRN     05/25/19 1538           Fredia Sorrow, MD 05/25/19 1546    Fredia Sorrow, MD 05/25/19 1550

## 2019-05-25 NOTE — ED Triage Notes (Signed)
Pt c/o HA x1 week pt also c/o R side neck pain.

## 2019-05-25 NOTE — Discharge Instructions (Addendum)
Try the Benadryl and Phenergan together at night to see if it helps relieve the headache.  Follow-up with your primary care doctor as needed.  Would get additional work-up if headache persist beyond a week.  Return for any new or worse symptoms.

## 2019-05-27 ENCOUNTER — Emergency Department (HOSPITAL_BASED_OUTPATIENT_CLINIC_OR_DEPARTMENT_OTHER): Payer: Medicaid Other

## 2019-05-27 ENCOUNTER — Encounter (HOSPITAL_BASED_OUTPATIENT_CLINIC_OR_DEPARTMENT_OTHER): Payer: Self-pay

## 2019-05-27 ENCOUNTER — Emergency Department (HOSPITAL_BASED_OUTPATIENT_CLINIC_OR_DEPARTMENT_OTHER)
Admission: EM | Admit: 2019-05-27 | Discharge: 2019-05-27 | Discharge status: Against Medical Advice | Payer: Medicaid Other | Attending: Emergency Medicine | Admitting: Emergency Medicine

## 2019-05-27 ENCOUNTER — Other Ambulatory Visit: Payer: Self-pay

## 2019-05-27 DIAGNOSIS — M79604 Pain in right leg: Secondary | ICD-10-CM | POA: Diagnosis not present

## 2019-05-27 DIAGNOSIS — Z5321 Procedure and treatment not carried out due to patient leaving prior to being seen by health care provider: Secondary | ICD-10-CM | POA: Diagnosis not present

## 2019-05-27 DIAGNOSIS — Z880 Allergy status to penicillin: Secondary | ICD-10-CM | POA: Insufficient documentation

## 2019-05-27 DIAGNOSIS — Z885 Allergy status to narcotic agent status: Secondary | ICD-10-CM | POA: Diagnosis not present

## 2019-05-27 DIAGNOSIS — Z79899 Other long term (current) drug therapy: Secondary | ICD-10-CM | POA: Diagnosis not present

## 2019-05-27 DIAGNOSIS — M79661 Pain in right lower leg: Secondary | ICD-10-CM

## 2019-05-27 DIAGNOSIS — G7100 Muscular dystrophy, unspecified: Secondary | ICD-10-CM | POA: Diagnosis not present

## 2019-05-27 MED ORDER — NAPROXEN 500 MG PO TABS: 500.0000 mg | ORAL_TABLET | Freq: Two times a day (BID) | ORAL | 0 refills | Status: DC

## 2019-05-27 NOTE — ED Notes (Signed)
Left before getting printed d/c paperwork

## 2019-05-27 NOTE — ED Provider Notes (Signed)
[facility] EMERGENCY DEPARTMENT Provider Note   CSN: [id number] Arrival date & time: 05/27/19  1937     History Chief Complaint  Patient presents with  . Leg Swelling    Elizabeth Hooper is a 28 y.o. female with a history of muscular dystrophy and prior tubal ligation who presents to the emergency department with complaints of right lower leg pain and swelling intermittently over the past few days.  Patient states that her symptoms are localized to the lateral aspect of the right lower leg, worse with certain movements, no alleviating factors.  No specific change in activity or traumatic injury.  Denies fever, chills, redness, numbness, tingling, weakness, chest pain, dyspnea, hemoptysis, recent surgery/trauma, recent long travel, hormone use, personal hx of cancer, or hx of DVT/PE.      HPI     Past Medical History:  Diagnosis Date  . Muscular dystrophy (HCC)   . UTI (urinary tract infection) during pregnancy   . Vertigo     Patient Active Problem List   Diagnosis Date Noted  . Normal labor 09/29/2013  . Supervision of high risk pregnancy in third trimester 07/22/2013  . 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 Previous cesarean section complicating pregnancy 07/22/2013  . GBS bacteriuria 07/16/2013    Past Surgical History:  Procedure Laterality Date  . CESAREAN SECTION    . CESAREAN SECTION N/A 09/27/2013   Procedure: CESAREAN SECTION WITH BILATERAL TUBAL LIGATION;  Surgeon: Lesly Dukes, MD;  Location: [facility];  Service: Obstetrics;  Laterality: N/A;  . TUBAL LIGATION       OB History    Gravida  2   Para  2   Term  2   Preterm  0   AB      Living  2     SAB      TAB      Ectopic      Multiple      Live Births  2           Family History  Problem Relation Age of Onset  . Muscular dystrophy Father   . Hypertension Father     Social History   Tobacco Use  . Smoking status: Never Smoker  . Smokeless tobacco: Never Used   Substance Use Topics  . Alcohol use: Yes    Comment: occ  . Drug use: No    Home Medications Prior to Admission medications   Medication Sig Start Date End Date Taking? Authorizing Provider  diphenhydrAMINE (BENADRYL) 25 MG tablet Take 1 tablet (25 mg total) by mouth every 6 (six) hours. 05/25/19   Vanetta Mulders, MD  meclizine (ANTIVERT) 25 MG tablet Take 1 tablet (25 mg total) by mouth 3 (three) times daily as needed for up to 20 doses for dizziness. 12/15/18   Curatolo, Adam, DO  naproxen (NAPROSYN) 500 MG tablet Take 1 tablet (500 mg total) by mouth 2 (two) times daily as needed (for pain). 12/24/18   Molpus, John, MD  pantoprazole (PROTONIX) 20 MG tablet Take 1 tablet (20 mg total) by mouth daily. 02/17/19 03/19/19  Curatolo, Adam, DO  promethazine (PHENERGAN) 25 MG tablet Take 1 tablet (25 mg total) by mouth every 6 (six) hours as needed for nausea or vomiting. 05/25/19   Vanetta Mulders, MD  cetirizine (ZYRTEC ALLERGY) 10 MG tablet Take 1 tablet (10 mg total) by mouth daily. 06/27/18 12/24/18  Gwyneth Sprout, MD  famotidine (PEPCID) 20 MG tablet Take 1 tablet (20 mg total) by  mouth 2 (two) times daily. 06/19/18 12/24/18  Henderly, Britni A, PA-C  fluticasone (FLONASE) 50 MCG/ACT nasal spray Place 2 sprays into both nostrils daily. 06/04/18 12/24/18  Rodell Perna A, PA-C  potassium chloride SA (K-DUR,KLOR-CON) 20 MEQ tablet Take 1 tablet (20 mEq total) by mouth daily. 07/28/18 12/24/18  Kitt Minardi, Glynda Jaeger, PA-C  prochlorperazine (COMPAZINE) 10 MG tablet Take 1 tablet (10 mg total) by mouth 2 (two) times daily as needed for nausea or vomiting. 03/12/18 12/24/18  Tegeler, Gwenyth Allegra, MD    Allergies    Hydrocodone, Penicillins, and Percocet [oxycodone-acetaminophen]  Review of Systems   Review of Systems  Constitutional: Negative for chills and fever.  Respiratory: Negative for shortness of breath.   Cardiovascular: Positive for leg swelling. Negative for chest pain and palpitations.   Musculoskeletal: Positive for myalgias.  Skin: Negative for color change, rash and wound.  Neurological: Negative for weakness and numbness.    Physical Exam Updated Vital Signs BP 124/79 (BP Location: Left Arm)   Pulse 97   Temp 98.7 F (37.1 C) (Oral)   Resp 18   LMP 05/13/2019   SpO2 100%   Physical Exam Vitals and nursing note reviewed.  Constitutional:      General: She is not in acute distress.    Appearance: She is not ill-appearing or toxic-appearing.  HENT:     Head: Normocephalic and atraumatic.  Cardiovascular:     Pulses:          Dorsalis pedis pulses are 2+ on the right side and 2+ on the left side.       Posterior tibial pulses are 2+ on the right side and 2+ on the left side.  Pulmonary:     Effort: Pulmonary effort is normal.  Musculoskeletal:     Comments: Lower extremities: No obvious deformity, appreciable swelling, edema, erythema, ecchymosis, warmth, or open wounds. Patient has intact AROM to bilateral hips, knees, ankles, and all digits. Tender to palpation to the right lateral lower leg muscles. No point/focal bony tenderness.   Skin:    General: Skin is warm and dry.     Capillary Refill: Capillary refill takes less than 2 seconds.  Neurological:     Mental Status: She is alert.     Comments: Alert. Clear speech. Sensation grossly intact to bilateral lower extremities. 5/5 strength with plantar/dorsiflexion bilaterally. Patient ambulatory.  Psychiatric:        Mood and Affect: Mood normal.        Behavior: Behavior normal.     ED Results / Procedures / Treatments   Labs (all labs ordered are listed, but only abnormal results are displayed) Labs Reviewed - No data to display  EKG None  Radiology No results found.  Procedures Procedures (including critical care time)  Medications Ordered in ED Medications - No data to display  ED Course  I have reviewed the triage vital signs and the nursing notes.  Pertinent labs & imaging results  that were available during my care of the patient were reviewed by me and considered in my medical decision making (see chart for details).    MDM Rules/Calculators/A&P                      Patient presents to the emergency department with right lower lateral leg pain/swelling intermittently over the past few days.  She is nontoxic-appearing, resting comfortably, vitals WNL. No appreciable swelling/edema. Tender to palpation to the R lateral lower leg, NVI distally.  No fever, erythema, or warmth to suggest infectious process such as cellulitis or septic joint.  Atraumatic, no point/focal bony tenderness, do not suspect fracture or dislocation.  Compartments are soft, NVI distally, not consistent with compartment syndrome. Initial plan was for venous duplex to r/o DVT- suspicion for this was low, patient requesting to leave prior to having this completed, refusing it currently- discussed risks/benefits/alternatives, will place order so that she may return to have this completed should she wish to, given patient is unsure if she will return and my suspicion for DVT is low will not admin prophylactic lovenox. Overall suspect muscular/tendon origin, will trial naproxen with sports medicine & PCP follow-up. Per nursing patient left prior to receiving discharge paperwork/prescription.   Patient signed out AMA.   Final Clinical Impression(s) / ED Diagnoses Final diagnoses:  Pain of right lower leg    Rx / DC Orders ED Discharge Orders         Ordered    naproxen (NAPROSYN) 500 MG tablet  2 times daily     05/27/19 2202    VAS US LOWER EXTREMITY VENOUS (DVT)     05/27/19 2202           Cherly Andersonetrucelli, Mychelle Kendra R, PA-C 05/27/19 2210    Linwood DibblesKnapp, Jon, MD 05/31/19 [phone]

## 2019-05-27 NOTE — ED Triage Notes (Signed)
Pt c/o swelling to right LE x 3 days-denies injury-NAD-steady gait

## 2019-05-27 NOTE — Discharge Instructions (Addendum)
You were seen in the emergency department today for right lower leg pain and swelling.  You did not wish to stay for your ultrasound. We have ordered for you to have this performed here should you wish to return for it please call the ED and ask for radiology. We suspect this may be a muscular strain/irritation.  We are sending you home with naproxen to help with pain and swelling.  - Naproxen is a nonsteroidal anti-inflammatory medication that will help with pain and swelling. Be sure to take this medication as prescribed with food, 1 pill every 12 hours,  It should be taken with food, as it can cause stomach upset, and more seriously, stomach bleeding. Do not take other nonsteroidal anti-inflammatory medications with this such as Advil, Motrin, Aleve, Mobic, Goodie Powder, or Motrin.    You make take Tylenol per over the counter dosing with these medications.   We have prescribed you new medication(s) today. Discuss the medications prescribed today with your pharmacist as they can have adverse effects and interactions with your other medicines including over the counter and prescribed medications. Seek medical evaluation if you start to experience new or abnormal symptoms after taking one of these medicines, seek care immediately if you start to experience difficulty breathing, feeling of your throat closing, facial swelling, or rash as these could be indications of a more serious allergic reaction  Please follow-up with Elizabeth Hooper in 1 week for reevaluation if you have not had improvement, Elizabeth Hooper is a sports medicine doctor within this building.  Please call the office to make an appointment.  Return to the ER for new or worsening symptoms including but not limited to increased pain, chest pain, dyspnea, increased swelling, redness, warmth, fever, numbness, weakness, trouble walking, or any other concerns.

## 2019-06-02 ENCOUNTER — Ambulatory Visit (HOSPITAL_BASED_OUTPATIENT_CLINIC_OR_DEPARTMENT_OTHER): Payer: Medicaid Other

## 2019-06-13 ENCOUNTER — Emergency Department (HOSPITAL_BASED_OUTPATIENT_CLINIC_OR_DEPARTMENT_OTHER)
Admission: EM | Admit: 2019-06-13 | Discharge: 2019-06-13 | Disposition: A | Discharge status: Home / Self Care | Payer: Medicaid Other | Attending: Emergency Medicine | Admitting: Emergency Medicine

## 2019-06-13 ENCOUNTER — Encounter (HOSPITAL_BASED_OUTPATIENT_CLINIC_OR_DEPARTMENT_OTHER): Payer: Self-pay

## 2019-06-13 ENCOUNTER — Other Ambulatory Visit: Payer: Self-pay

## 2019-06-13 DIAGNOSIS — M542 Cervicalgia: Secondary | ICD-10-CM

## 2019-06-13 DIAGNOSIS — Z885 Allergy status to narcotic agent status: Secondary | ICD-10-CM | POA: Insufficient documentation

## 2019-06-13 DIAGNOSIS — Z880 Allergy status to penicillin: Secondary | ICD-10-CM | POA: Insufficient documentation

## 2019-06-13 DIAGNOSIS — G7100 Muscular dystrophy, unspecified: Secondary | ICD-10-CM | POA: Insufficient documentation

## 2019-06-13 DIAGNOSIS — M546 Pain in thoracic spine: Secondary | ICD-10-CM | POA: Insufficient documentation

## 2019-06-13 DIAGNOSIS — G8929 Other chronic pain: Secondary | ICD-10-CM | POA: Diagnosis not present

## 2019-06-13 DIAGNOSIS — Z79899 Other long term (current) drug therapy: Secondary | ICD-10-CM | POA: Insufficient documentation

## 2019-06-13 MED ORDER — IBUPROFEN 400 MG PO TABS
400.0000 mg | ORAL_TABLET | Freq: Once | ORAL | Status: AC | PRN
  Administered 2019-06-13: 14:00:00 400 mg via ORAL

## 2019-06-13 MED ORDER — IBUPROFEN 400 MG PO TABS
ORAL_TABLET | ORAL | Status: AC
  Filled 2019-06-13: qty 1

## 2019-06-13 MED ORDER — NAPROXEN 375 MG PO TABS: 375.0000 mg | ORAL_TABLET | Freq: Two times a day (BID) | ORAL | 0 refills | Status: DC

## 2019-06-13 NOTE — ED Triage Notes (Signed)
Pt c/o R sided neck pain x 4-5 days. Denies injury.

## 2019-06-13 NOTE — ED Provider Notes (Signed)
[facility] EMERGENCY DEPARTMENT Provider Note   CSN: [id number] Arrival date & time: 06/13/19  1354     History Chief Complaint  Patient presents with  . Neck Pain    Elizabeth Hooper is a 29 y.o. female with past medical history of muscular dystrophy, UTI and vertigo.  Patient complains of chronic upper back and neck pain.  She says she has been to the emergency department several times for headaches.  She gets them every day.  Patient states that her neck and upper shoulders and mid back and been very painful.  Hurts when she touches her muscles.  Does not like to wear bras because she has trouble finding bras that fit her and wears at least a G cup which is difficult to find in the daily setting.  She denies any injuries up to the of her back.  She denies changes in vision, unilateral weakness, difficulty with speech or swallowing.  She states that she feels her headaches are coming from her neck pain.  HPI     Past Medical History:  Diagnosis Date  . Muscular dystrophy (HCC)   . UTI (urinary tract infection) during pregnancy   . Vertigo     Patient Active Problem List   Diagnosis Date Noted  . Normal labor 09/29/2013  . Supervision of high risk pregnancy in third trimester 07/22/2013  . 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 Previous cesarean section complicating pregnancy 07/22/2013  . GBS bacteriuria 07/16/2013    Past Surgical History:  Procedure Laterality Date  . CESAREAN SECTION    . CESAREAN SECTION N/A 09/27/2013   Procedure: CESAREAN SECTION WITH BILATERAL TUBAL LIGATION;  Surgeon: Lesly Dukes, MD;  Location: [facility];  Service: Obstetrics;  Laterality: N/A;  . TUBAL LIGATION       OB History    Gravida  2   Para  2   Term  2   Preterm  0   AB      Living  2     SAB      TAB      Ectopic      Multiple      Live Births  2           Family History  Problem Relation Age of Onset  . Muscular dystrophy Father   . Hypertension  Father     Social History   Tobacco Use  . Smoking status: Never Smoker  . Smokeless tobacco: Never Used  Substance Use Topics  . Alcohol use: Yes    Comment: occ  . Drug use: No    Home Medications Prior to Admission medications   Medication Sig Start Date End Date Taking? Authorizing Provider  diphenhydrAMINE (BENADRYL) 25 MG tablet Take 1 tablet (25 mg total) by mouth every 6 (six) hours. 05/25/19   Vanetta Mulders, MD  meclizine (ANTIVERT) 25 MG tablet Take 1 tablet (25 mg total) by mouth 3 (three) times daily as needed for up to 20 doses for dizziness. 12/15/18   Curatolo, Adam, DO  naproxen (NAPROSYN) 500 MG tablet Take 1 tablet (500 mg total) by mouth 2 (two) times daily. 05/27/19   Petrucelli, Samantha R, PA-C  pantoprazole (PROTONIX) 20 MG tablet Take 1 tablet (20 mg total) by mouth daily. 02/17/19 03/19/19  Curatolo, Adam, DO  promethazine (PHENERGAN) 25 MG tablet Take 1 tablet (25 mg total) by mouth every 6 (six) hours as needed for nausea or vomiting. 05/25/19   Vanetta Mulders, MD  cetirizine (ZYRTEC ALLERGY) 10 MG tablet Take 1 tablet (10 mg total) by mouth daily. 06/27/18 12/24/18  Gwyneth Sprout, MD  famotidine (PEPCID) 20 MG tablet Take 1 tablet (20 mg total) by mouth 2 (two) times daily. 06/19/18 12/24/18  Henderly, Britni A, PA-C  fluticasone (FLONASE) 50 MCG/ACT nasal spray Place 2 sprays into both nostrils daily. 06/04/18 12/24/18  Michela Pitcher A, PA-C  potassium chloride SA (K-DUR,KLOR-CON) 20 MEQ tablet Take 1 tablet (20 mEq total) by mouth daily. 07/28/18 12/24/18  Petrucelli, Pleas Koch, PA-C  prochlorperazine (COMPAZINE) 10 MG tablet Take 1 tablet (10 mg total) by mouth 2 (two) times daily as needed for nausea or vomiting. 03/12/18 12/24/18  Tegeler, Canary Brim, MD    Allergies    Hydrocodone, Penicillins, and Percocet [oxycodone-acetaminophen]  Review of Systems   Review of Systems Ten systems reviewed and are negative for acute change, except as noted in the  HPI.   Physical Exam Updated Vital Signs BP 119/85 (BP Location: Left Wrist)   Pulse 82   Temp 98.8 F (37.1 C) (Oral)   Resp 16   Ht 5\' 1"  (1.549 m)   Wt 102.1 kg   LMP 06/09/2019   SpO2 100%   BMI 42.51 kg/m   Physical Exam Vitals and nursing note reviewed.  Constitutional:      General: She is not in acute distress.    Appearance: She is well-developed. She is not diaphoretic.  HENT:     Head: Normocephalic and atraumatic.  Eyes:     General: No scleral icterus.    Conjunctiva/sclera: Conjunctivae normal.     Pupils: Pupils are equal, round, and reactive to light.     Comments: No horizontal, vertical or rotational nystagmus  Neck:     Comments: Full active and passive ROM without pain No midline or paraspinal tenderness No nuchal rigidity or meningeal signs Cardiovascular:     Rate and Rhythm: Normal rate and regular rhythm.     Heart sounds: Normal heart sounds. No murmur. No friction rub. No gallop.   Pulmonary:     Effort: Pulmonary effort is normal. No respiratory distress.     Breath sounds: Normal breath sounds. No wheezing or rales.  Abdominal:     General: Bowel sounds are normal. There is no distension.     Palpations: Abdomen is soft. There is no mass.     Tenderness: There is no abdominal tenderness. There is no guarding or rebound.  Musculoskeletal:        General: Normal range of motion.     Cervical back: Normal range of motion and neck supple.     Comments: Extremely tight and tender trapezius, mid thoracic and cervical paraspinal muscles. Patient has forward rounded shoulders. She has very large pendulous breasts and is not wearing a bra.  Lymphadenopathy:     Cervical: No cervical adenopathy.  Skin:    General: Skin is warm and dry.     Findings: No rash.  Neurological:     Mental Status: She is alert and oriented to person, place, and time.     Cranial Nerves: No cranial nerve deficit.     Motor: No abnormal muscle tone.     Coordination:  Coordination normal.     Comments: Mental Status:  Alert, oriented, thought content appropriate. Speech fluent without evidence of aphasia. Able to follow 2 step commands without difficulty.  Cranial Nerves:  II:  Peripheral visual fields grossly normal, pupils equal, round, reactive to light III,IV,  VI: ptosis not present, extra-ocular motions intact bilaterally  V,VII: smile symmetric, facial light touch sensation equal VIII: hearing grossly normal bilaterally  IX,X: midline uvula rise  XI: bilateral shoulder shrug equal and strong XII: midline tongue extension  Motor:  5/5 in upper and lower extremities bilaterally including strong and equal grip strength and dorsiflexion/plantar flexion Sensory: Pinprick and light touch normal in all extremities.  Cerebellar: normal finger-to-nose with bilateral upper extremities Gait: normal gait and balance CV: distal pulses palpable throughout   Psychiatric:        Behavior: Behavior normal.        Thought Content: Thought content normal.        Judgment: Judgment normal.     ED Results / Procedures / Treatments   Labs (all labs ordered are listed, but only abnormal results are displayed) Labs Reviewed - No data to display  EKG None  Radiology No results found.  Procedures Procedures (including critical care time)  Medications Ordered in ED Medications  ibuprofen (ADVIL) 400 MG tablet (has no administration in time range)  ibuprofen (ADVIL) tablet 400 mg (400 mg Oral Given 06/13/19 1411)    ED Course  I have reviewed the triage vital signs and the nursing notes.  Pertinent labs & imaging results that were available during my care of the patient were reviewed by me and considered in my medical decision making (see chart for details).    MDM Rules/Calculators/A&P                      She has chronic upper back and shoulder pain.  I think this is primarily due to the fact that she has such heavy weight on the frontal aspect of  her thorax contributing to chronic muscular tightness in the shoulders neck and back.  I have advised the patient that she needs to get a very good bra to support her breasts.  I have given her home care techniques, advised seeing her PCP for potential physical therapy.  We talked about breast reduction surgeries.  Also spoke about massage therapy and other modalities which may help.   Final Clinical Impression(s) / ED Diagnoses Final diagnoses:  None    Rx / DC Orders ED Discharge Orders    None       Margarita Mail, PA-C 06/13/19 1834    Wyvonnia Dusky, MD 06/14/19 1248

## 2019-06-13 NOTE — Discharge Instructions (Addendum)
SEEK IMMEDIATE MEDICAL ATTENTION IF: New numbness, tingling, weakness, or problem with the use of your arms or legs.  Severe back pain not relieved with medications.  Change in bowel or bladder control.  Increasing pain in any areas of the body (such as chest or abdominal pain).  Shortness of breath, dizziness or fainting.  Nausea (feeling sick to your stomach), vomiting, fever, or sweats.  

## 2020-01-10 ENCOUNTER — Encounter (HOSPITAL_BASED_OUTPATIENT_CLINIC_OR_DEPARTMENT_OTHER): Payer: Self-pay

## 2020-01-10 ENCOUNTER — Other Ambulatory Visit: Payer: Self-pay

## 2020-01-10 DIAGNOSIS — R112 Nausea with vomiting, unspecified: Secondary | ICD-10-CM | POA: Insufficient documentation

## 2020-01-10 DIAGNOSIS — Z5321 Procedure and treatment not carried out due to patient leaving prior to being seen by health care provider: Secondary | ICD-10-CM | POA: Insufficient documentation

## 2020-01-10 DIAGNOSIS — R197 Diarrhea, unspecified: Secondary | ICD-10-CM | POA: Diagnosis not present

## 2020-01-10 LAB — URINALYSIS, ROUTINE W REFLEX MICROSCOPIC
Bilirubin Urine: NEGATIVE
Glucose, UA: NEGATIVE mg/dL
Ketones, ur: NEGATIVE mg/dL
Nitrite: NEGATIVE
Protein, ur: NEGATIVE mg/dL
Specific Gravity, Urine: 1.025 (ref 1.005–1.030)
pH: 6 (ref 5.0–8.0)

## 2020-01-10 LAB — CBC
HCT: 44.5 % (ref 36.0–46.0)
Hemoglobin: 14 g/dL (ref 12.0–15.0)
MCH: 25.8 pg — ABNORMAL LOW (ref 26.0–34.0)
MCHC: 31.5 g/dL (ref 30.0–36.0)
MCV: 82.1 fL (ref 80.0–100.0)
Platelets: 273 10*3/uL (ref 150–400)
RBC: 5.42 MIL/uL — ABNORMAL HIGH (ref 3.87–5.11)
RDW: 14 % (ref 11.5–15.5)
WBC: 10.4 10*3/uL (ref 4.0–10.5)
nRBC: 0 % (ref 0.0–0.2)

## 2020-01-10 LAB — LIPASE, BLOOD: Lipase: 31 U/L (ref 11–51)

## 2020-01-10 LAB — URINALYSIS, MICROSCOPIC (REFLEX)

## 2020-01-10 LAB — COMPREHENSIVE METABOLIC PANEL
ALT: 42 U/L (ref 0–44)
AST: 31 U/L (ref 15–41)
Albumin: 4.2 g/dL (ref 3.5–5.0)
Alkaline Phosphatase: 94 U/L (ref 38–126)
Anion gap: 12 (ref 5–15)
BUN: 12 mg/dL (ref 6–20)
CO2: 24 mmol/L (ref 22–32)
Calcium: 9.1 mg/dL (ref 8.9–10.3)
Chloride: 101 mmol/L (ref 98–111)
Creatinine, Ser: 0.55 mg/dL (ref 0.44–1.00)
GFR calc Af Amer: >60 mL/min (ref >60)
GFR calc non Af Amer: >60 mL/min (ref >60)
Glucose, Bld: 106 mg/dL — ABNORMAL HIGH (ref 70–99)
Potassium: 3.6 mmol/L (ref 3.5–5.1)
Sodium: 137 mmol/L (ref 135–145)
Total Bilirubin: 0.5 mg/dL (ref 0.3–1.2)
Total Protein: 8.9 g/dL — ABNORMAL HIGH (ref 6.5–8.1)

## 2020-01-10 LAB — PREGNANCY, URINE: Preg Test, Ur: NEGATIVE

## 2020-01-10 MED ORDER — SODIUM CHLORIDE 0.9% FLUSH
3.0000 mL | Freq: Once | INTRAVENOUS | Status: DC
  Filled 2020-01-10: qty 3

## 2020-01-10 NOTE — ED Triage Notes (Signed)
Pt c/o abd pain, n/v/d x 4-5 days-NAD-steady gait

## 2020-01-11 ENCOUNTER — Emergency Department (HOSPITAL_BASED_OUTPATIENT_CLINIC_OR_DEPARTMENT_OTHER)
Admission: EM | Admit: 2020-01-11 | Discharge: 2020-01-11 | Disposition: A | Discharge status: Home / Self Care | Payer: Medicaid Other | Attending: Emergency Medicine | Admitting: Emergency Medicine

## 2020-01-11 NOTE — ED Notes (Signed)
Called to treatment room with no answer from lobby 

## 2020-01-12 ENCOUNTER — Emergency Department (HOSPITAL_BASED_OUTPATIENT_CLINIC_OR_DEPARTMENT_OTHER)
Admission: EM | Admit: 2020-01-12 | Discharge: 2020-01-12 | Disposition: A | Discharge status: Home / Self Care | Payer: Medicaid Other | Attending: Emergency Medicine | Admitting: Emergency Medicine

## 2020-01-12 ENCOUNTER — Other Ambulatory Visit: Payer: Self-pay

## 2020-01-12 ENCOUNTER — Encounter (HOSPITAL_BASED_OUTPATIENT_CLINIC_OR_DEPARTMENT_OTHER): Payer: Self-pay | Admitting: Emergency Medicine

## 2020-01-12 DIAGNOSIS — R1013 Epigastric pain: Secondary | ICD-10-CM | POA: Diagnosis present

## 2020-01-12 DIAGNOSIS — A084 Viral intestinal infection, unspecified: Secondary | ICD-10-CM | POA: Diagnosis not present

## 2020-01-12 MED ORDER — SODIUM CHLORIDE 0.9 % IV BOLUS
1000.0000 mL | Freq: Once | INTRAVENOUS | Status: AC
  Administered 2020-01-12: 1000 mL via INTRAVENOUS

## 2020-01-12 MED ORDER — ONDANSETRON HCL 4 MG/2ML IJ SOLN
4.0000 mg | Freq: Once | INTRAMUSCULAR | Status: AC
  Administered 2020-01-12: 4 mg via INTRAVENOUS
  Filled 2020-01-12: qty 2

## 2020-01-12 MED ORDER — ONDANSETRON 8 MG PO TBDP: 8.0000 mg | ORAL_TABLET | Freq: Three times a day (TID) | ORAL | 0 refills | Status: DC | PRN

## 2020-01-12 NOTE — ED Provider Notes (Signed)
[facility] Provider Note: Lowella Dell, MD, FACEP  CSN: [id number] MRN: [id number] ARRIVAL: 01/12/20 at 0357 ROOM: [facility]01/[facility]01   CHIEF COMPLAINT  Abdominal Pain   HISTORY OF PRESENT ILLNESS  01/12/20 4:11 AM Elizabeth Hooper is a 29 y.o. female with 4 days of epigastric pain with vomiting and diarrhea.  She has a child at home with similar symptoms.  She is also currently taking Flagyl for bacterial vaginosis.  She rates her pain is a 5 out of 10 cramping and intermittent in nature.  She checked into the ED two days ago but left without being seen.  Laboratory studies done at that time were unremarkable.  Her last episode of vomiting was just prior to arrival.  It had improved for a day until it returned this morning.   Past Medical History:  Diagnosis Date  . Muscular dystrophy (HCC)   . UTI (urinary tract infection) during pregnancy   . Vertigo     Past Surgical History:  Procedure Laterality Date  . CESAREAN SECTION    . CESAREAN SECTION N/A 09/27/2013   Procedure: CESAREAN SECTION WITH BILATERAL TUBAL LIGATION;  Surgeon: Lesly Dukes, MD;  Location: [facility];  Service: Obstetrics;  Laterality: N/A;  . TUBAL LIGATION      Family History  Problem Relation Age of Onset  . Muscular dystrophy Father   . Hypertension Father     Social History   Tobacco Use  . Smoking status: Never Smoker  . Smokeless tobacco: Never Used  Vaping Use  . Vaping Use: Never used  Substance Use Topics  . Alcohol use: Yes    Comment: occ  . Drug use: No    Prior to Admission medications   Medication Sig Start Date End Date Taking? Authorizing Provider  ondansetron (ZOFRAN ODT) 8 MG disintegrating tablet Take 1 tablet (8 mg total) by mouth every 8 (eight) hours as needed for nausea or vomiting. 01/12/20   Emsley Custer, MD  Vitamin D, Ergocalciferol, (DRISDOL) 1.25 MG (50000 UNIT) CAPS capsule Take 50,000 Units by mouth once a week. 12/07/19   [provider]    cetirizine (ZYRTEC ALLERGY) 10 MG tablet Take 1 tablet (10 mg total) by mouth daily. 06/27/18 12/24/18  Gwyneth Sprout, MD  famotidine (PEPCID) 20 MG tablet Take 1 tablet (20 mg total) by mouth 2 (two) times daily. 06/19/18 12/24/18  Henderly, Britni A, PA-C  fluticasone (FLONASE) 50 MCG/ACT nasal spray Place 2 sprays into both nostrils daily. 06/04/18 12/24/18  Michela Pitcher A, PA-C  potassium chloride SA (K-DUR,KLOR-CON) 20 MEQ tablet Take 1 tablet (20 mEq total) by mouth daily. 07/28/18 12/24/18  Petrucelli, Pleas Koch, PA-C  prochlorperazine (COMPAZINE) 10 MG tablet Take 1 tablet (10 mg total) by mouth 2 (two) times daily as needed for nausea or vomiting. 03/12/18 12/24/18  Tegeler, Canary Brim, MD    Allergies Hydrocodone, Penicillins, and Percocet [oxycodone-acetaminophen]   REVIEW OF SYSTEMS  Negative except as noted here or in the History of Present Illness.   PHYSICAL EXAMINATION  Initial Vital Signs Blood pressure 126/65, pulse 73, temperature 97.9 F (36.6 C), temperature source Oral, resp. rate 16, height 5\' 1"  (1.549 m), weight 101.6 kg, last menstrual period 12/28/2019, SpO2 100 %.  Examination General: Well-developed, well-nourished female in no acute distress; appearance consistent with age of record HENT: normocephalic; atraumatic Eyes: pupils equal, round and reactive to light; extraocular muscles intact Neck: supple Heart: regular rate and rhythm Lungs: clear to auscultation bilaterally Abdomen: soft;  nondistended; nontender; bowel sounds present Extremities: No deformity; full range of motion; pulses normal Neurologic: Awake, alert and oriented; motor function intact in all extremities and symmetric; no facial droop Skin: Warm and dry Psychiatric: Normal mood and affect   RESULTS  Summary of this visit's results, reviewed and interpreted by myself:   EKG Interpretation  Date/Time:    Ventricular Rate:    PR Interval:    QRS Duration:   QT Interval:    QTC  Calculation:   R Axis:     Text Interpretation:        Laboratory Studies: No results found for this or any previous visit (from the past 24 hour(s)). Imaging Studies: No results found.  ED COURSE and MDM  Nursing notes, initial and subsequent vitals signs, including pulse oximetry, reviewed and interpreted by myself.  Vitals:   01/12/20 0406 01/12/20 0407 01/12/20 0533  BP:  126/65 108/63  Pulse:  73 72  Resp:  16 16  Temp:  97.9 F (36.6 C)   TempSrc:  Oral   SpO2:  100% 100%  Weight: 101.6 kg    Height: 5\' 1"  (1.549 m)     Medications  sodium chloride 0.9 % bolus 1,000 mL ( Intravenous Stopped 01/12/20 0531)  ondansetron (ZOFRAN) injection 4 mg (4 mg Intravenous Given 01/12/20 0430)    5:33 AM Patient feeling better after IV fluids and Zofran.  Symptoms consistent with a viral gastroenteritis especially given that her daughter had a similar illness.  PROCEDURES  Procedures   ED DIAGNOSES     ICD-10-CM   1. Viral gastroenteritis  A08.4        Lilas Diefendorf, MD 01/12/20 [phone]

## 2020-01-12 NOTE — ED Triage Notes (Addendum)
Pt c/o epigastric pain with vomiting and diarrhea x 4 days. Pt with child at home with similar symptoms. Pt is also currently taking flagyl for BV.

## 2020-01-17 IMAGING — CT CT HEAD WITHOUT CONTRAST
3 series · 15 of 47 positions shown, 18 images · non-contrast
Comparison: None.

CLINICAL DATA: Headaches and dizziness

EXAM:
CT HEAD WITHOUT CONTRAST
TECHNIQUE: Contiguous axial images were obtained from the base of the skull
through the vertex without intravenous contrast.

[Series 2: head wo · axial · 0.42mm/px · z∈[+1250,+1380]mm · 9 of 32 slices shown, 12 images]
[im 3/32  brain]
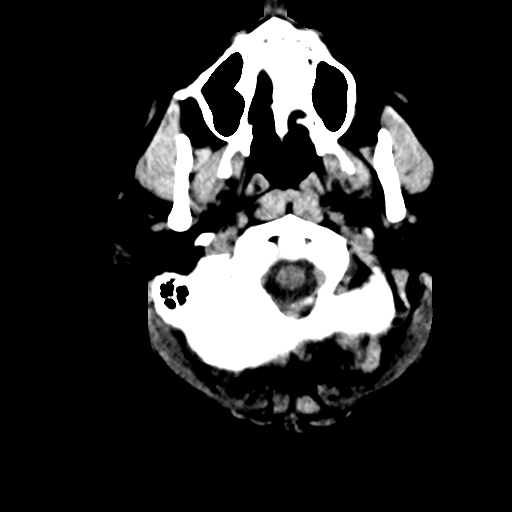
[im 3/32  bone]
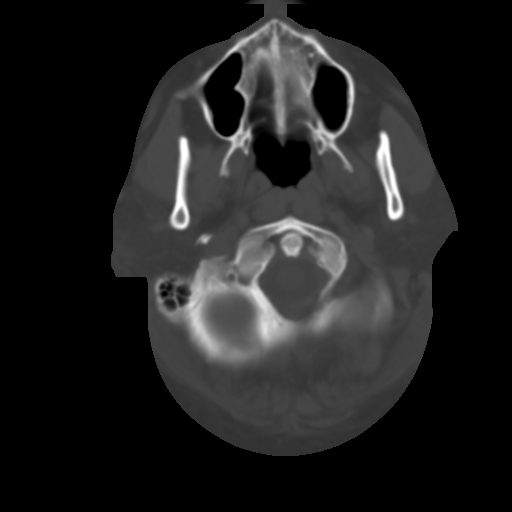
[im 6/32  brain]
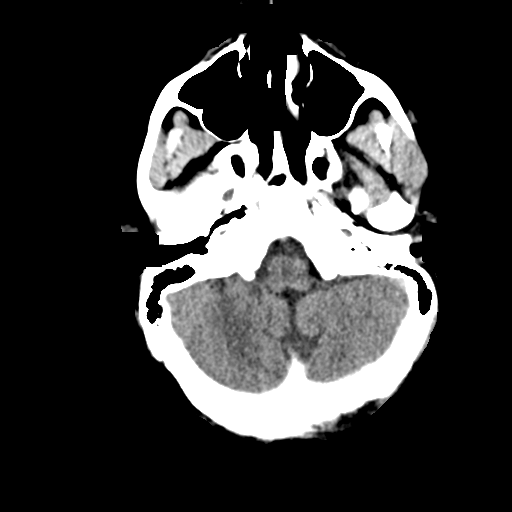
[im 9/32  brain]
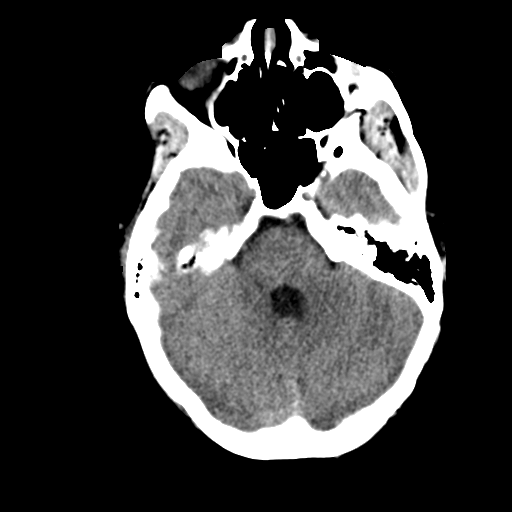
[im 12/32  brain]
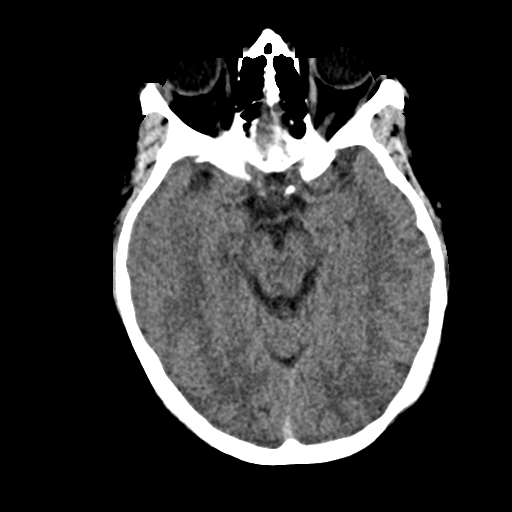
[im 17/32  brain]
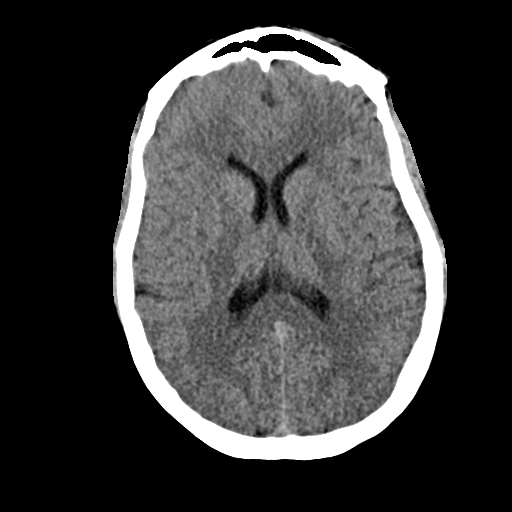
[im 17/32  bone]
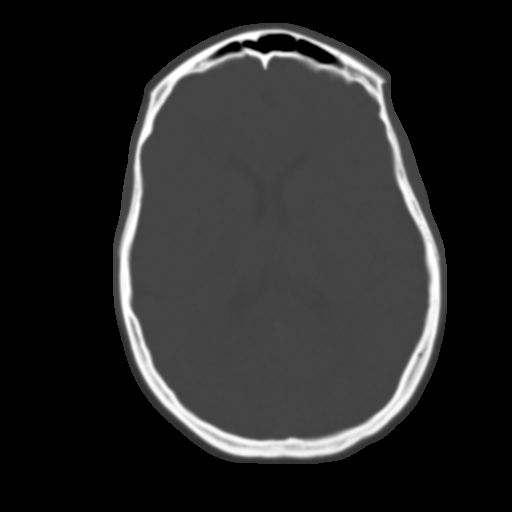
[im 20/32  brain]
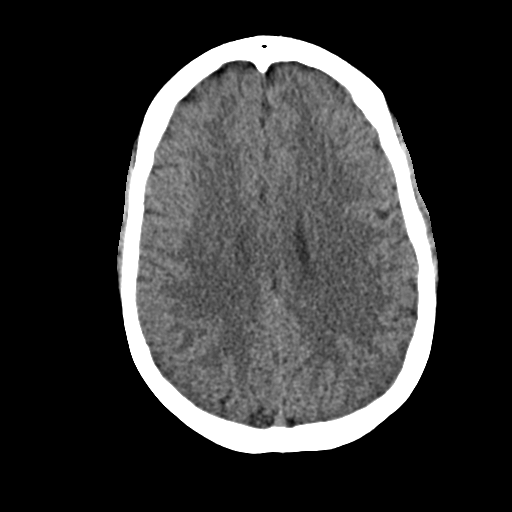
[im 23/32  brain]
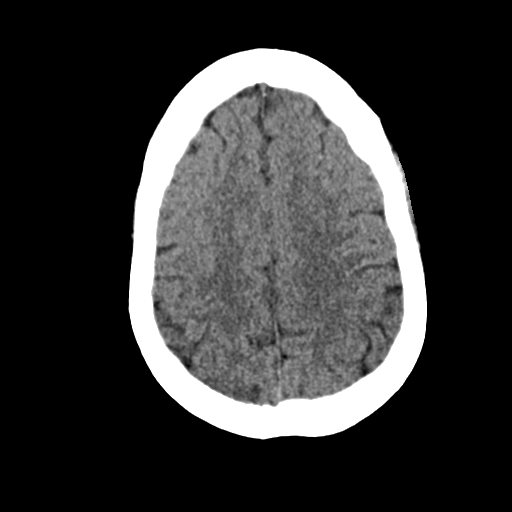
[im 26/32  brain]
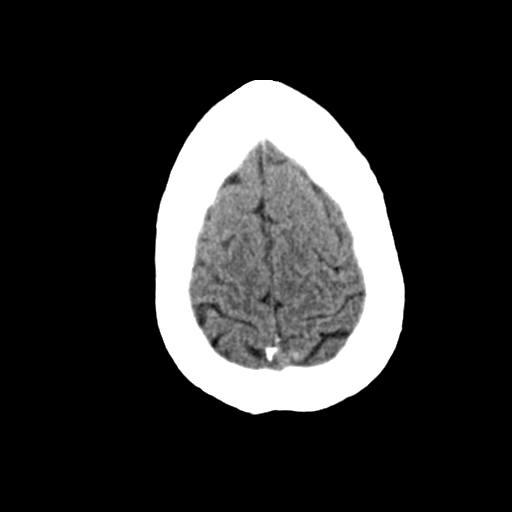
[im 29/32  brain]
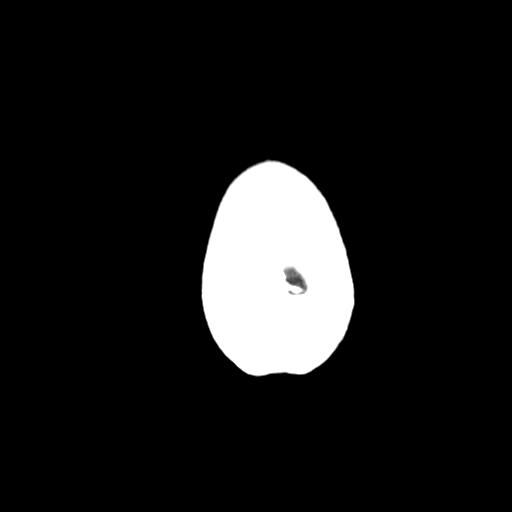
[im 29/32  bone]
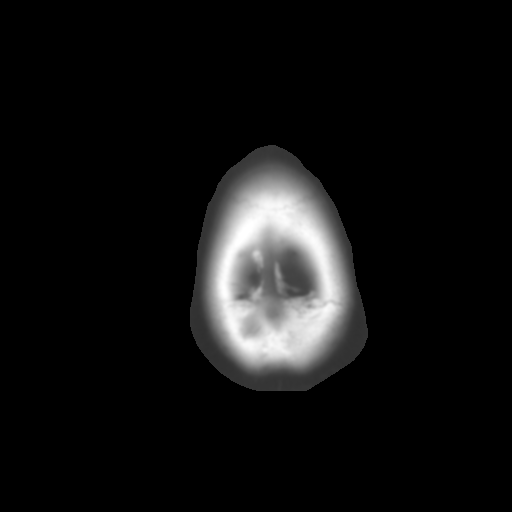

[Series 4: coronal soft · coronal · 0.31mm/px · 3 of 67 slices shown]
[im 23/67  brain]
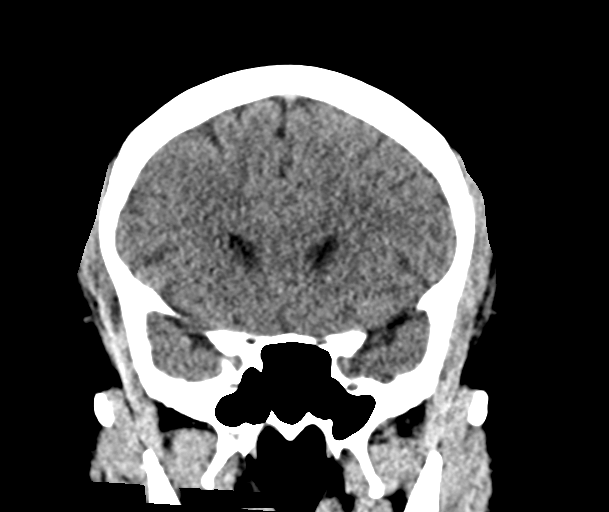
[im 30/67  brain]
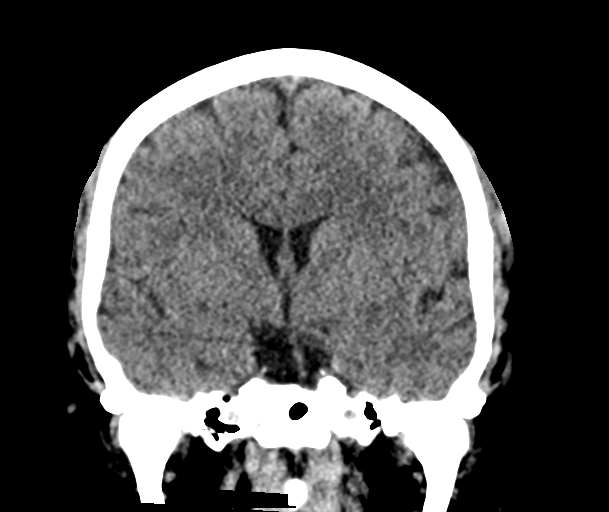
[im 37/67  brain]
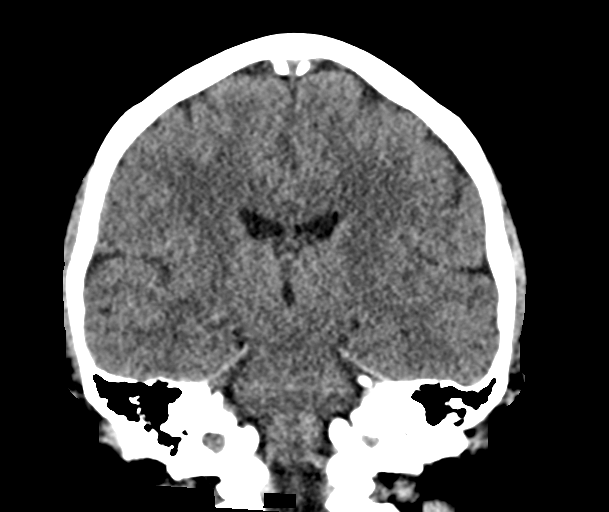

[Series 5: sag soft · sagittal · 0.31mm/px · 3 of 64 slices shown]
[im 22/64  brain]
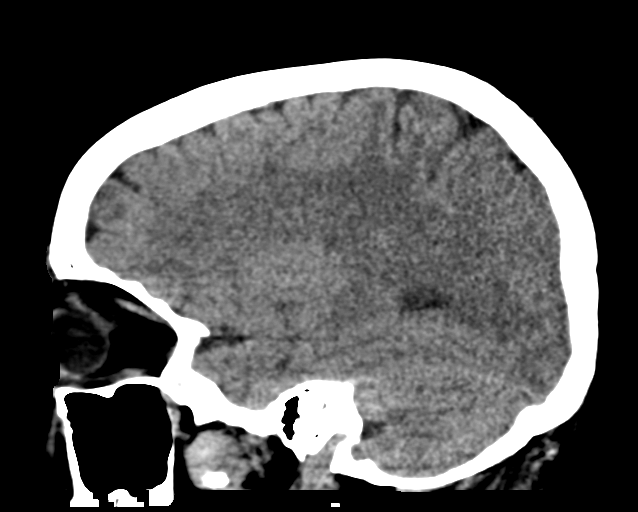
[im 32/64  brain]
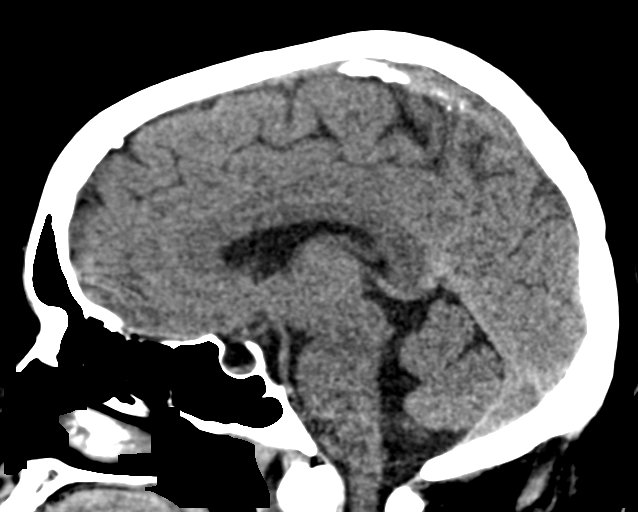
[im 43/64  brain]
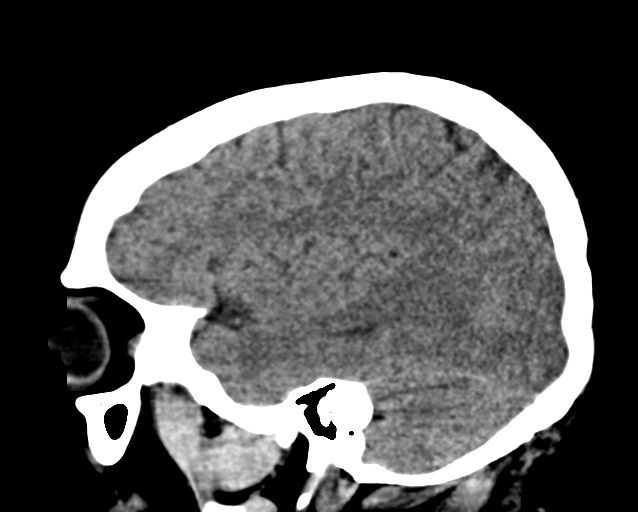

[15 of 47 positions shown; findings below may reference images not displayed]

FINDINGS: Brain: There is no mass, hemorrhage or extra-axial collection. The
size and configuration of the ventricles and extra-axial CSF spaces
are normal. The brain parenchyma is normal, without acute or chronic
infarction.

Vascular: No abnormal hyperdensity of the major intracranial
arteries or dural venous sinuses. No intracranial atherosclerosis.

Skull: The visualized skull base, calvarium and extracranial soft
tissues are normal.

Sinuses/Orbits: No fluid levels or advanced mucosal thickening of
the visualized paranasal sinuses. No mastoid or middle ear effusion.
The orbits are normal.
IMPRESSION: Normal head CT.

## 2020-02-09 ENCOUNTER — Other Ambulatory Visit: Payer: Self-pay

## 2020-02-09 ENCOUNTER — Encounter (HOSPITAL_BASED_OUTPATIENT_CLINIC_OR_DEPARTMENT_OTHER): Payer: Self-pay

## 2020-02-09 DIAGNOSIS — J029 Acute pharyngitis, unspecified: Secondary | ICD-10-CM | POA: Diagnosis not present

## 2020-02-09 DIAGNOSIS — Z79899 Other long term (current) drug therapy: Secondary | ICD-10-CM | POA: Diagnosis not present

## 2020-02-09 DIAGNOSIS — Z20822 Contact with and (suspected) exposure to covid-19: Secondary | ICD-10-CM | POA: Diagnosis not present

## 2020-02-09 LAB — SARS CORONAVIRUS 2 BY RT PCR (HOSPITAL ORDER, PERFORMED IN ~~LOC~~ HOSPITAL LAB): SARS Coronavirus 2: NEGATIVE

## 2020-02-09 LAB — GROUP A STREP BY PCR: Group A Strep by PCR: NOT DETECTED

## 2020-02-09 NOTE — ED Triage Notes (Signed)
Pt c/o sore throat x 2 days with +covid exposure-NAD-steady gait

## 2020-02-10 ENCOUNTER — Emergency Department (HOSPITAL_BASED_OUTPATIENT_CLINIC_OR_DEPARTMENT_OTHER)
Admission: EM | Admit: 2020-02-10 | Discharge: 2020-02-10 | Disposition: A | Discharge status: Home / Self Care | Payer: Medicaid Other | Attending: Emergency Medicine | Admitting: Emergency Medicine

## 2020-02-10 DIAGNOSIS — Z20822 Contact with and (suspected) exposure to covid-19: Secondary | ICD-10-CM

## 2020-02-10 NOTE — Discharge Instructions (Addendum)
Your Covid test was negative today.  The symptoms you are experiencing are likely allergies, use an over-the-counter antihistamine.  Because you did have a close COVID contact, you should quarantine until you can get tested in another 5 to 7 days and it is negative.

## 2020-02-10 NOTE — ED Provider Notes (Signed)
[facility] EMERGENCY DEPARTMENT Provider Note   CSN: [id number] Arrival date & time: 02/09/20  2054     History Chief Complaint  Patient presents with  . Sore Throat    Elizabeth Hooper is a 29 y.o. female.  Reports that she has had a scratchy dry throat for the last 2 days.  No fever, nasal congestion or cough.  She does have a positive Covid contact.  Father was diagnosed with COVID today.  Today was her last exposure to him.        Past Medical History:  Diagnosis Date  . Muscular dystrophy (HCC)   . UTI (urinary tract infection) during pregnancy   . Vertigo     Patient Active Problem List   Diagnosis Date Noted  . Normal labor 09/29/2013  . Supervision of high risk pregnancy in third trimester 07/22/2013  . 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 Previous cesarean section complicating pregnancy 07/22/2013  . GBS bacteriuria 07/16/2013    Past Surgical History:  Procedure Laterality Date  . CESAREAN SECTION    . CESAREAN SECTION N/A 09/27/2013   Procedure: CESAREAN SECTION WITH BILATERAL TUBAL LIGATION;  Surgeon: Lesly Dukes, MD;  Location: [facility];  Service: Obstetrics;  Laterality: N/A;  . TUBAL LIGATION       OB History    Gravida  2   Para  2   Term  2   Preterm  0   AB      Living  2     SAB      TAB      Ectopic      Multiple      Live Births  2           Family History  Problem Relation Age of Onset  . Muscular dystrophy Father   . Hypertension Father     Social History   Tobacco Use  . Smoking status: Never Smoker  . Smokeless tobacco: Never Used  Vaping Use  . Vaping Use: Never used  Substance Use Topics  . Alcohol use: Yes    Comment: occ  . Drug use: No    Home Medications Prior to Admission medications   Medication Sig Start Date End Date Taking? Authorizing Provider  cephALEXin (KEFLEX) 500 MG capsule Take 500 mg by mouth 3 (three) times daily. 01/19/20   [provider]  ondansetron  (ZOFRAN ODT) 8 MG disintegrating tablet Take 1 tablet (8 mg total) by mouth every 8 (eight) hours as needed for nausea or vomiting. 01/12/20   Molpus, John, MD  Vitamin D, Ergocalciferol, (DRISDOL) 1.25 MG (50000 UNIT) CAPS capsule Take 50,000 Units by mouth once a week. 12/07/19   [provider]  cetirizine (ZYRTEC ALLERGY) 10 MG tablet Take 1 tablet (10 mg total) by mouth daily. 06/27/18 12/24/18  Gwyneth Sprout, MD  famotidine (PEPCID) 20 MG tablet Take 1 tablet (20 mg total) by mouth 2 (two) times daily. 06/19/18 12/24/18  Henderly, Britni A, PA-C  fluticasone (FLONASE) 50 MCG/ACT nasal spray Place 2 sprays into both nostrils daily. 06/04/18 12/24/18  Michela Pitcher A, PA-C  potassium chloride SA (K-DUR,KLOR-CON) 20 MEQ tablet Take 1 tablet (20 mEq total) by mouth daily. 07/28/18 12/24/18  Petrucelli, Pleas Koch, PA-C  prochlorperazine (COMPAZINE) 10 MG tablet Take 1 tablet (10 mg total) by mouth 2 (two) times daily as needed for nausea or vomiting. 03/12/18 12/24/18  Tegeler, Canary Brim, MD    Allergies    Hydrocodone, Penicillins, and  Percocet [oxycodone-acetaminophen]  Review of Systems   Review of Systems  HENT: Positive for sore throat.   All other systems reviewed and are negative.   Physical Exam Updated Vital Signs BP 120/69 (BP Location: Right Arm)   Pulse 81   Temp 98.5 F (36.9 C) (Oral)   Resp 14   Ht 5\' 1"  (1.549 m)   Wt 101.2 kg   LMP 01/28/2020   SpO2 100%   BMI 42.14 kg/m   Physical Exam Vitals and nursing note reviewed.  Constitutional:      General: She is not in acute distress.    Appearance: Normal appearance. She is well-developed.  HENT:     Head: Normocephalic and atraumatic.     Right Ear: Hearing normal.     Left Ear: Hearing normal.     Nose: Nose normal.  Eyes:     Conjunctiva/sclera: Conjunctivae normal.     Pupils: Pupils are equal, round, and reactive to light.  Cardiovascular:     Rate and Rhythm: Regular rhythm.     Heart sounds: S1  normal and S2 normal. No murmur heard.  No friction rub. No gallop.   Pulmonary:     Effort: Pulmonary effort is normal. No respiratory distress.     Breath sounds: Normal breath sounds.  Chest:     Chest wall: No tenderness.  Abdominal:     General: Bowel sounds are normal.     Palpations: Abdomen is soft.     Tenderness: There is no abdominal tenderness. There is no guarding or rebound. Negative signs include Murphy's sign and McBurney's sign.     Hernia: No hernia is present.  Musculoskeletal:        General: Normal range of motion.     Cervical back: Normal range of motion and neck supple.  Skin:    General: Skin is warm and dry.     Findings: No rash.  Neurological:     Mental Status: She is alert and oriented to person, place, and time.     GCS: GCS eye subscore is 4. GCS verbal subscore is 5. GCS motor subscore is 6.     Cranial Nerves: No cranial nerve deficit.     Sensory: No sensory deficit.     Coordination: Coordination normal.  Psychiatric:        Speech: Speech normal.        Behavior: Behavior normal.        Thought Content: Thought content normal.     ED Results / Procedures / Treatments   Labs (all labs ordered are listed, but only abnormal results are displayed) Labs Reviewed  SARS CORONAVIRUS 2 BY RT PCR (HOSPITAL ORDER, PERFORMED IN [facility])  GROUP A STREP BY PCR    EKG None  Radiology No results found.  Procedures Procedures (including critical care time)  Medications Ordered in ED Medications - No data to display  ED Course  I have reviewed the triage vital signs and the nursing notes.  Pertinent labs & imaging results that were available during my care of the patient were reviewed by me and considered in my medical decision making (see chart for details).    MDM Rules/Calculators/A&P                          Patient appears well.  Vital signs are normal.  No pulmonary symptoms and lungs are clear.  Strep and Covid are  negative.  She does, however, have a positive Covid contact.  Last contact was today.  She was counseled that even though her Covid was negative today, she still could get sick.  She needs to quarantine and repeat the test in approximately 7 days before she can know that she is Covid free.  Symptoms today are likely secondary to allergies, recommend antihistamine.  Final Clinical Impression(s) / ED Diagnoses Final diagnoses:  Close exposure to COVID-19 virus    Rx / DC Orders ED Discharge Orders    None       Reve Crocket, Canary Brim, MD 02/10/20 0201

## 2020-04-29 ENCOUNTER — Emergency Department (HOSPITAL_BASED_OUTPATIENT_CLINIC_OR_DEPARTMENT_OTHER)
Admission: EM | Admit: 2020-04-29 | Discharge: 2020-04-29 | Disposition: A | Discharge status: Home / Self Care | Payer: Medicaid Other | Attending: Emergency Medicine | Admitting: Emergency Medicine

## 2020-04-29 ENCOUNTER — Encounter (HOSPITAL_BASED_OUTPATIENT_CLINIC_OR_DEPARTMENT_OTHER): Payer: Self-pay | Admitting: Emergency Medicine

## 2020-04-29 ENCOUNTER — Other Ambulatory Visit: Payer: Self-pay

## 2020-04-29 DIAGNOSIS — K5900 Constipation, unspecified: Secondary | ICD-10-CM | POA: Insufficient documentation

## 2020-04-29 DIAGNOSIS — A5903 Trichomonal cystitis and urethritis: Secondary | ICD-10-CM | POA: Insufficient documentation

## 2020-04-29 DIAGNOSIS — Z9851 Tubal ligation status: Secondary | ICD-10-CM | POA: Insufficient documentation

## 2020-04-29 DIAGNOSIS — R102 Pelvic and perineal pain: Secondary | ICD-10-CM | POA: Diagnosis present

## 2020-04-29 LAB — WET PREP, GENITAL
Clue Cells Wet Prep HPF POC: NONE SEEN
Sperm: NONE SEEN
Trich, Wet Prep: NONE SEEN
Yeast Wet Prep HPF POC: NONE SEEN

## 2020-04-29 LAB — URINALYSIS, ROUTINE W REFLEX MICROSCOPIC
Bilirubin Urine: NEGATIVE
Glucose, UA: NEGATIVE mg/dL
Ketones, ur: NEGATIVE mg/dL
Nitrite: NEGATIVE
Protein, ur: NEGATIVE mg/dL
Specific Gravity, Urine: 1.02 (ref 1.005–1.030)
pH: 6.5 (ref 5.0–8.0)

## 2020-04-29 LAB — URINALYSIS, MICROSCOPIC (REFLEX)

## 2020-04-29 LAB — PREGNANCY, URINE: Preg Test, Ur: NEGATIVE

## 2020-04-29 MED ORDER — METRONIDAZOLE 500 MG PO TABS
500.0000 mg | ORAL_TABLET | Freq: Once | ORAL | Status: AC
  Administered 2020-04-29: 500 mg via ORAL
  Filled 2020-04-29: qty 1

## 2020-04-29 MED ORDER — METRONIDAZOLE 500 MG PO TABS
500.0000 mg | ORAL_TABLET | Freq: Two times a day (BID) | ORAL | 0 refills | Status: DC
Start: 2020-04-29 — End: 2020-05-24

## 2020-04-29 NOTE — Discharge Instructions (Signed)
Please read the instructions below.  Talk with your primary care provider about any new medications.  Please schedule an appointment for follow up with your OBGYN or primary care.  Finish your antibiotic (Flagyl/Metronidazole) as prescribed. Do not drink alcohol with this medication as it will cause vomiting.  You will receive a call from the hospital if your test results come back positive. Avoid all sexual activity until you know your test results. If your results come back positive, it is important that you inform all of your sexual partners. You can take Colace daily to help with your constipation.  This is likely related to your iron supplementation. Return to the ER for high fever, severe abdominal pain, or new or worsening symptoms.

## 2020-04-29 NOTE — ED Triage Notes (Signed)
Reports pelvic pain for the last two weeks that comes and goes.  Also reports she may be constipated.  Endorses some urinary urgency.  Denies any discharge.  Denies any n/v or fevers.

## 2020-04-29 NOTE — ED Provider Notes (Signed)
[facility] EMERGENCY DEPARTMENT Provider Note   CSN: [id number] Arrival date & time: 04/29/20  2122     History Chief Complaint  Patient presents with  . Pelvic Pain    Elizabeth Hooper is a 29 y.o. female presenting to the emergency department with complaint of weeks of intermittent lower abdominal/pelvic pain that feels crampy and achy in nature.  She states she has some front in her lower abdomen when she urinates and also feels constipation.  She takes daily iron supplementation and wonders if this could be the cause of her constipation.  She denies any abnormal vaginal discharge or bleeding.  Denies nausea or vomiting, fevers, or other concerning symptoms.  She is sexually active with female partner without protection.  The history is provided by the patient.       Past Medical History:  Diagnosis Date  . Muscular dystrophy (HCC)   . UTI (urinary tract infection) during pregnancy   . Vertigo     Patient Active Problem List   Diagnosis Date Noted  . Normal labor 09/29/2013  . Supervision of high risk pregnancy in third trimester 07/22/2013  . 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 Previous cesarean section complicating pregnancy 07/22/2013  . GBS bacteriuria 07/16/2013    Past Surgical History:  Procedure Laterality Date  . CESAREAN SECTION    . CESAREAN SECTION N/A 09/27/2013   Procedure: CESAREAN SECTION WITH BILATERAL TUBAL LIGATION;  Surgeon: Lesly Dukes, MD;  Location: [facility];  Service: Obstetrics;  Laterality: N/A;  . TUBAL LIGATION       OB History    Gravida  2   Para  2   Term  2   Preterm  0   AB      Living  2     SAB      TAB      Ectopic      Multiple      Live Births  2           Family History  Problem Relation Age of Onset  . Muscular dystrophy Father   . Hypertension Father     Social History   Tobacco Use  . Smoking status: Never Smoker  . Smokeless tobacco: Never Used  Vaping Use  . Vaping Use: Never  used  Substance Use Topics  . Alcohol use: Yes    Comment: occ  . Drug use: No    Home Medications Prior to Admission medications   Medication Sig Start Date End Date Taking? Authorizing Provider  cephALEXin (KEFLEX) 500 MG capsule Take 500 mg by mouth 3 (three) times daily. 01/19/20   [provider]  metroNIDAZOLE (FLAGYL) 500 MG tablet Take 1 tablet (500 mg total) by mouth 2 (two) times daily. 04/29/20   Ronetta Molla, [place] N, PA-C  ondansetron (ZOFRAN ODT) 8 MG disintegrating tablet Take 1 tablet (8 mg total) by mouth every 8 (eight) hours as needed for nausea or vomiting. 01/12/20   Molpus, John, MD  Vitamin D, Ergocalciferol, (DRISDOL) 1.25 MG (50000 UNIT) CAPS capsule Take 50,000 Units by mouth once a week. 12/07/19   [provider]  cetirizine (ZYRTEC ALLERGY) 10 MG tablet Take 1 tablet (10 mg total) by mouth daily. 06/27/18 12/24/18  Gwyneth Sprout, MD  famotidine (PEPCID) 20 MG tablet Take 1 tablet (20 mg total) by mouth 2 (two) times daily. 06/19/18 12/24/18  Henderly, Britni A, PA-C  fluticasone (FLONASE) 50 MCG/ACT nasal spray Place 2 sprays into both nostrils daily.  06/04/18 12/24/18  Michela Pitcher A, PA-C  potassium chloride SA (K-DUR,KLOR-CON) 20 MEQ tablet Take 1 tablet (20 mEq total) by mouth daily. 07/28/18 12/24/18  Petrucelli, Pleas Koch, PA-C  prochlorperazine (COMPAZINE) 10 MG tablet Take 1 tablet (10 mg total) by mouth 2 (two) times daily as needed for nausea or vomiting. 03/12/18 12/24/18  Tegeler, Canary Brim, MD    Allergies    Hydrocodone, Penicillins, and Percocet [oxycodone-acetaminophen]  Review of Systems   Review of Systems  Constitutional: Negative for fever.  Gastrointestinal: Positive for abdominal pain and constipation.  Genitourinary: Positive for dysuria and pelvic pain.  All other systems reviewed and are negative.   Physical Exam Updated Vital Signs BP 117/85 (BP Location: Right Wrist)   Pulse 80   Temp 98.6 F (37 C) (Oral)   Resp  20   Ht 5\' 1"  (1.549 m)   Wt 101.6 kg   SpO2 100%   BMI 42.32 kg/m   Physical Exam Vitals and nursing note reviewed. Exam conducted with a chaperone present.  Constitutional:      Appearance: She is well-developed.  HENT:     Head: Normocephalic and atraumatic.  Eyes:     Conjunctiva/sclera: Conjunctivae normal.  Cardiovascular:     Rate and Rhythm: Normal rate and regular rhythm.  Pulmonary:     Effort: Pulmonary effort is normal. No respiratory distress.     Breath sounds: Normal breath sounds.  Abdominal:     General: Bowel sounds are normal.     Palpations: Abdomen is soft.     Tenderness: There is abdominal tenderness in the suprapubic area. There is no guarding or rebound.  Genitourinary:    General: Normal vulva.     Adnexa:        Right: No fullness.         Left: No fullness.       Comments: Exam performed with female RN chaperone present. Malodorous thin discharge is noted in the vagina. Mild  discomfort on bimanual exam, no chandelier sign.  Skin:    General: Skin is warm.  Neurological:     Mental Status: She is alert.  Psychiatric:        Behavior: Behavior normal.     ED Results / Procedures / Treatments   Labs (all labs ordered are listed, but only abnormal results are displayed) Labs Reviewed  WET PREP, GENITAL - Abnormal; Notable for the following components:      Result Value   WBC, Wet Prep HPF POC FEW (*)    All other components within normal limits  URINALYSIS, ROUTINE W REFLEX MICROSCOPIC - Abnormal; Notable for the following components:   Hgb urine dipstick SMALL (*)    Leukocytes,Ua TRACE (*)    All other components within normal limits  URINALYSIS, MICROSCOPIC (REFLEX) - Abnormal; Notable for the following components:   Bacteria, UA RARE (*)    Trichomonas, UA PRESENT (*)    All other components within normal limits  PREGNANCY, URINE  HIV ANTIBODY (ROUTINE TESTING W REFLEX)  RPR  GC/CHLAMYDIA PROBE AMP ([place]) NOT AT [facility]     EKG None  Radiology No results found.  Procedures Procedures (including critical care time)  Medications Ordered in ED Medications  metroNIDAZOLE (FLAGYL) tablet 500 mg (has no administration in time range)    ED Course  I have reviewed the triage vital signs and the nursing notes.  Pertinent labs & imaging results that were available during my care of the patient were  reviewed by me and considered in my medical decision making (see chart for details).    MDM Rules/Calculators/A&P                          Patient presenting for 2 weeks of lower abdominal discomfort, dysuria, constipation. Patient does take daily iron supplementation, likely the source of her constipation. Pelvic exam with malodorous discharge, U/A positive for trich. Wet prep is unremarkable. GC/Ch, HIV, RPR cultures sent. No remarkable tenderness on pelvic exam, seems less likely PID. Dysuria is likely due to trichomonas. Will treat with flagyl.  Patient is aware she has STD cultures pending, instructed to avoid intercourse traction as a result.  Instructed to inform sexual partners of positive trichomonas so they can be treated.  Recommend Colace for constipation.  GYN follow-up and PCP follow-up as indicated.  Patient is appropriate for discharge.  Discussed results, findings, treatment and follow up. Patient advised of return precautions. Patient verbalized understanding and agreed with plan.  Final Clinical Impression(s) / ED Diagnoses Final diagnoses:  Trichomonal urethritis  Constipation, unspecified constipation type    Rx / DC Orders ED Discharge Orders         Ordered    metroNIDAZOLE (FLAGYL) 500 MG tablet  2 times daily        04/29/20 2347           Dwaine Pringle, [place] N, PA-C 04/29/20 2351    Tilden Fossa, MD 04/30/20 [phone]

## 2020-04-30 LAB — RPR: RPR Ser Ql: NONREACTIVE

## 2020-04-30 LAB — HIV ANTIBODY (ROUTINE TESTING W REFLEX): HIV Screen 4th Generation wRfx: NONREACTIVE

## 2020-05-01 LAB — GC/CHLAMYDIA PROBE AMP (~~LOC~~) NOT AT ARMC
Chlamydia: NEGATIVE
Comment: NEGATIVE
Comment: NORMAL
Neisseria Gonorrhea: NEGATIVE

## 2020-05-23 ENCOUNTER — Other Ambulatory Visit: Payer: Self-pay

## 2020-05-23 ENCOUNTER — Encounter (HOSPITAL_BASED_OUTPATIENT_CLINIC_OR_DEPARTMENT_OTHER): Payer: Self-pay | Admitting: *Deleted

## 2020-05-23 DIAGNOSIS — J09X2 Influenza due to identified novel influenza A virus with other respiratory manifestations: Secondary | ICD-10-CM | POA: Insufficient documentation

## 2020-05-23 DIAGNOSIS — R059 Cough, unspecified: Secondary | ICD-10-CM | POA: Diagnosis present

## 2020-05-23 DIAGNOSIS — Z20822 Contact with and (suspected) exposure to covid-19: Secondary | ICD-10-CM | POA: Insufficient documentation

## 2020-05-23 MED ORDER — IBUPROFEN 400 MG PO TABS
400.0000 mg | ORAL_TABLET | Freq: Once | ORAL | Status: AC
  Administered 2020-05-23: 23:00:00 400 mg via ORAL
  Filled 2020-05-23: qty 1

## 2020-05-23 NOTE — ED Triage Notes (Signed)
Cough, sore throat, bodyaches and fever for a week. Her son has the flu.

## 2020-05-24 ENCOUNTER — Emergency Department (HOSPITAL_BASED_OUTPATIENT_CLINIC_OR_DEPARTMENT_OTHER)
Admission: EM | Admit: 2020-05-24 | Discharge: 2020-05-24 | Disposition: A | Discharge status: Home / Self Care | Payer: Medicaid Other | Attending: Emergency Medicine | Admitting: Emergency Medicine

## 2020-05-24 DIAGNOSIS — J101 Influenza due to other identified influenza virus with other respiratory manifestations: Secondary | ICD-10-CM

## 2020-05-24 LAB — RESP PANEL BY RT-PCR (FLU A&B, COVID) ARPGX2
Influenza A by PCR: POSITIVE — AB
Influenza B by PCR: NEGATIVE
SARS Coronavirus 2 by RT PCR: NEGATIVE

## 2020-05-24 NOTE — ED Provider Notes (Signed)
[facility] EMERGENCY DEPARTMENT Provider Note   CSN: [id number] Arrival date & time: 05/23/20  2238     History Chief Complaint  Patient presents with  . Cough  . Fever  . Sore Throat    Elizabeth Hooper is a 29 y.o. female.  The history is provided by the patient.  Cough Severity:  Moderate Onset quality:  Gradual Duration:  1 week Timing:  Intermittent Progression:  Worsening Chronicity:  New Relieved by:  Nothing Worsened by:  Nothing Associated symptoms: fever   Associated symptoms: no shortness of breath   Fever Associated symptoms: cough   Associated symptoms: no vomiting   Sore Throat Pertinent negatives include no shortness of breath.  Patient reports cough, fever and sore throat for the past week.  She reports myalgias.  Son has been diagnosed with influenza     Past Medical History:  Diagnosis Date  . Muscular dystrophy (HCC)   . UTI (urinary tract infection) during pregnancy   . Vertigo     Patient Active Problem List   Diagnosis Date Noted  . Normal labor 09/29/2013  . Supervision of high risk pregnancy in third trimester 07/22/2013  . 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 Previous cesarean section complicating pregnancy 07/22/2013  . GBS bacteriuria 07/16/2013    Past Surgical History:  Procedure Laterality Date  . CESAREAN SECTION    . CESAREAN SECTION N/A 09/27/2013   Procedure: CESAREAN SECTION WITH BILATERAL TUBAL LIGATION;  Surgeon: Lesly Dukes, MD;  Location: [facility];  Service: Obstetrics;  Laterality: N/A;  . TUBAL LIGATION       OB History    Gravida  2   Para  2   Term  2   Preterm  0   AB      Living  2     SAB      IAB      Ectopic      Multiple      Live Births  2           Family History  Problem Relation Age of Onset  . Muscular dystrophy Father   . Hypertension Father     Social History   Tobacco Use  . Smoking status: Never Smoker  . Smokeless tobacco: Never Used  Vaping Use   . Vaping Use: Never used  Substance Use Topics  . Alcohol use: Yes    Comment: occ  . Drug use: No    Home Medications Prior to Admission medications   Medication Sig Start Date End Date Taking? Authorizing Provider  Vitamin D, Ergocalciferol, (DRISDOL) 1.25 MG (50000 UNIT) CAPS capsule Take 50,000 Units by mouth once a week. 12/07/19   [provider]  cetirizine (ZYRTEC ALLERGY) 10 MG tablet Take 1 tablet (10 mg total) by mouth daily. 06/27/18 12/24/18  Gwyneth Sprout, MD  famotidine (PEPCID) 20 MG tablet Take 1 tablet (20 mg total) by mouth 2 (two) times daily. 06/19/18 12/24/18  Henderly, Britni A, PA-C  fluticasone (FLONASE) 50 MCG/ACT nasal spray Place 2 sprays into both nostrils daily. 06/04/18 12/24/18  Michela Pitcher A, PA-C  potassium chloride SA (K-DUR,KLOR-CON) 20 MEQ tablet Take 1 tablet (20 mEq total) by mouth daily. 07/28/18 12/24/18  Petrucelli, Pleas Koch, PA-C  prochlorperazine (COMPAZINE) 10 MG tablet Take 1 tablet (10 mg total) by mouth 2 (two) times daily as needed for nausea or vomiting. 03/12/18 12/24/18  Tegeler, Canary Brim, MD    Allergies    Hydrocodone, Penicillins, and  Percocet [oxycodone-acetaminophen]  Review of Systems   Review of Systems  Constitutional: Positive for fever.  Respiratory: Positive for cough. Negative for shortness of breath.   Gastrointestinal: Negative for vomiting.    Physical Exam Updated Vital Signs BP (!) 124/51   Pulse (!) 104   Temp 99.8 F (37.7 C) (Oral)   Resp 19   Ht 1.549 m (5\' 1" )   Wt 104.3 kg   LMP 05/12/2020   SpO2 100%   BMI 43.46 kg/m   Physical Exam CONSTITUTIONAL: Well developed/well nourished HEAD: Normocephalic/atraumatic EYES: EOMI/PERRL ENMT: Mask in place NECK: supple no meningeal signs SPINE/BACK:entire spine nontender CV: S1/S2 noted, no murmurs/rubs/gallops noted LUNGS: Lungs are clear to auscultation bilaterally, no apparent distress ABDOMEN: soft, nontender NEURO: Pt is  awake/alert/appropriate, moves all extremitiesx4.  No facial droop.   EXTREMITIES: pulses normal/equal, full ROM SKIN: warm, color normal PSYCH: no abnormalities of mood noted, alert and oriented to situation  ED Results / Procedures / Treatments   Labs (all labs ordered are listed, but only abnormal results are displayed) Labs Reviewed  RESP PANEL BY RT-PCR (FLU A&B, COVID) ARPGX2 - Abnormal; Notable for the following components:      Result Value   Influenza A by PCR POSITIVE (*)    All other components within normal limits    EKG None  Radiology No results found.  Procedures Procedures    Medications Ordered in ED Medications  ibuprofen (ADVIL) tablet 400 mg (400 mg Oral Given 05/23/20 2309)    ED Course  I have reviewed the triage vital signs and the nursing notes.  Pertinent labs results that were available during my care of the patient were reviewed by me and considered in my medical decision making (see chart for details).    MDM Rules/Calculators/A&P                          Patient well-appearing, no hypoxia, she is appropriate for outpatient management Final Clinical Impression(s) / ED Diagnoses Final diagnoses:  Influenza A    Rx / DC Orders ED Discharge Orders    None       2310, MD 05/24/20 [phone]

## 2020-07-19 ENCOUNTER — Emergency Department (HOSPITAL_BASED_OUTPATIENT_CLINIC_OR_DEPARTMENT_OTHER)
Admission: EM | Admit: 2020-07-19 | Discharge: 2020-07-19 | Disposition: A | Discharge status: Home / Self Care | Payer: Medicaid Other | Attending: Emergency Medicine | Admitting: Emergency Medicine

## 2020-07-19 ENCOUNTER — Other Ambulatory Visit: Payer: Self-pay

## 2020-07-19 ENCOUNTER — Encounter (HOSPITAL_BASED_OUTPATIENT_CLINIC_OR_DEPARTMENT_OTHER): Payer: Self-pay | Admitting: *Deleted

## 2020-07-19 DIAGNOSIS — J029 Acute pharyngitis, unspecified: Secondary | ICD-10-CM | POA: Diagnosis present

## 2020-07-19 DIAGNOSIS — H9202 Otalgia, left ear: Secondary | ICD-10-CM | POA: Diagnosis not present

## 2020-07-19 DIAGNOSIS — R3121 Asymptomatic microscopic hematuria: Secondary | ICD-10-CM | POA: Diagnosis not present

## 2020-07-19 LAB — URINALYSIS, ROUTINE W REFLEX MICROSCOPIC
Bilirubin Urine: NEGATIVE
Glucose, UA: NEGATIVE mg/dL
Ketones, ur: NEGATIVE mg/dL
Leukocytes,Ua: NEGATIVE
Nitrite: NEGATIVE
Protein, ur: NEGATIVE mg/dL
Specific Gravity, Urine: 1.025 (ref 1.005–1.030)
pH: 6 (ref 5.0–8.0)

## 2020-07-19 LAB — URINALYSIS, MICROSCOPIC (REFLEX)

## 2020-07-19 LAB — GROUP A STREP BY PCR: Group A Strep by PCR: NOT DETECTED

## 2020-07-19 MED ORDER — DEXAMETHASONE SODIUM PHOSPHATE 10 MG/ML IJ SOLN
10.0000 mg | Freq: Once | INTRAMUSCULAR | Status: AC
  Administered 2020-07-19: 10 mg via INTRAMUSCULAR
  Filled 2020-07-19: qty 1

## 2020-07-19 MED ORDER — IBUPROFEN 400 MG PO TABS
400.0000 mg | ORAL_TABLET | Freq: Once | ORAL | Status: AC
  Administered 2020-07-19: 400 mg via ORAL
  Filled 2020-07-19: qty 1

## 2020-07-19 NOTE — ED Triage Notes (Signed)
C/o sore throat and left ear pain , also c/o vomiting yesterday.

## 2020-07-19 NOTE — ED Notes (Signed)
Pt provided with urine specimen cup and water per request. Pt asking for pain meds for headache.

## 2020-07-19 NOTE — ED Provider Notes (Signed)
[facility] EMERGENCY DEPARTMENT Provider Note   CSN: [id number] Arrival date & time: 07/19/20  1623     History Chief Complaint  Patient presents with  . Sore Throat    Elizabeth Hooper is a 30 y.o. female.  Patient reporting onset of sore throat and left ear pain yesterday, along with low grade fever, fatigue, vomiting last night with current non-localized abdominal discomfort, and urinary frequency. Mild right flank pain. She endorses pain with swallowing, and noticing white exudate on her tonsils. She is not vaccinated for COVID. She has not been around anyone with URI symptoms. She has two elementary children at home.  The history is provided by the patient. No language interpreter was used.  Sore Throat This is a new problem. The current episode started yesterday. The problem has been gradually worsening. Associated symptoms include abdominal pain. She has tried nothing for the symptoms.       Past Medical History:  Diagnosis Date  . Muscular dystrophy (HCC)   . UTI (urinary tract infection) during pregnancy   . Vertigo     Patient Active Problem List   Diagnosis Date Noted  . Normal labor 09/29/2013  . Supervision of high risk pregnancy in third trimester 07/22/2013  . 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 Previous cesarean section complicating pregnancy 07/22/2013  . GBS bacteriuria 07/16/2013    Past Surgical History:  Procedure Laterality Date  . CESAREAN SECTION    . CESAREAN SECTION N/A 09/27/2013   Procedure: CESAREAN SECTION WITH BILATERAL TUBAL LIGATION;  Surgeon: Lesly Dukes, MD;  Location: [facility];  Service: Obstetrics;  Laterality: N/A;  . TUBAL LIGATION       OB History    Gravida  2   Para  2   Term  2   Preterm  0   AB      Living  2     SAB      IAB      Ectopic      Multiple      Live Births  2           Family History  Problem Relation Age of Onset  . Muscular dystrophy Father   . Hypertension Father      Social History   Tobacco Use  . Smoking status: Never Smoker  . Smokeless tobacco: Never Used  Vaping Use  . Vaping Use: Never used  Substance Use Topics  . Alcohol use: Yes    Comment: occ  . Drug use: No    Home Medications Prior to Admission medications   Medication Sig Start Date End Date Taking? Authorizing Provider  Vitamin D, Ergocalciferol, (DRISDOL) 1.25 MG (50000 UNIT) CAPS capsule Take 50,000 Units by mouth once a week. 12/07/19   [provider]  cetirizine (ZYRTEC ALLERGY) 10 MG tablet Take 1 tablet (10 mg total) by mouth daily. 06/27/18 12/24/18  Gwyneth Sprout, MD  famotidine (PEPCID) 20 MG tablet Take 1 tablet (20 mg total) by mouth 2 (two) times daily. 06/19/18 12/24/18  Henderly, Britni A, PA-C  fluticasone (FLONASE) 50 MCG/ACT nasal spray Place 2 sprays into both nostrils daily. 06/04/18 12/24/18  Michela Pitcher A, PA-C  potassium chloride SA (K-DUR,KLOR-CON) 20 MEQ tablet Take 1 tablet (20 mEq total) by mouth daily. 07/28/18 12/24/18  Petrucelli, Pleas Koch, PA-C  prochlorperazine (COMPAZINE) 10 MG tablet Take 1 tablet (10 mg total) by mouth 2 (two) times daily as needed for nausea or vomiting. 03/12/18 12/24/18  Tegeler, Cristal Deer  J, MD    Allergies    Hydrocodone, Penicillins, and Percocet [oxycodone-acetaminophen]  Review of Systems   Review of Systems  Constitutional: Positive for fatigue and fever.  HENT: Positive for ear pain and sore throat.   Gastrointestinal: Positive for abdominal pain.  Genitourinary: Positive for frequency.  All other systems reviewed and are negative.   Physical Exam Updated Vital Signs BP 114/87 (BP Location: Right Arm)   Pulse (!) 114   Temp 98.8 F (37.1 C) (Oral)   Resp 16   Ht 5\' 1"  (1.549 m)   Wt 101.6 kg   LMP 07/06/2020   SpO2 100%   BMI 42.32 kg/m   Physical Exam Vitals and nursing note reviewed.  Constitutional:      Appearance: She is well-developed.  HENT:     Head: Normocephalic.      Mouth/Throat:     Mouth: Mucous membranes are moist.     Pharynx: Oropharyngeal exudate and posterior oropharyngeal erythema present.  Eyes:     Conjunctiva/sclera: Conjunctivae normal.  Cardiovascular:     Rate and Rhythm: Tachycardia present.  Pulmonary:     Effort: Pulmonary effort is normal.     Breath sounds: Normal breath sounds.  Abdominal:     General: There is no distension.     Palpations: Abdomen is soft.     Tenderness: There is no rebound.  Skin:    General: Skin is warm and dry.  Neurological:     Mental Status: She is alert and oriented to person, place, and time.     ED Results / Procedures / Treatments   Labs (all labs ordered are listed, but only abnormal results are displayed) Labs Reviewed  URINALYSIS, ROUTINE W REFLEX MICROSCOPIC - Abnormal; Notable for the following components:      Result Value   Color, Urine AMBER (*)    APPearance HAZY (*)    Hgb urine dipstick MODERATE (*)    All other components within normal limits  URINALYSIS, MICROSCOPIC (REFLEX) - Abnormal; Notable for the following components:   Bacteria, UA RARE (*)    All other components within normal limits  GROUP A STREP BY PCR    EKG None  Radiology No results found.  Procedures Procedures   Medications Ordered in ED Medications  ibuprofen (ADVIL) tablet 400 mg (400 mg Oral Given 07/19/20 1706)  dexamethasone (DECADRON) injection 10 mg (10 mg Intramuscular Given 07/19/20 1753)    ED Course  I have reviewed the triage vital signs and the nursing notes.  Pertinent labs & imaging results that were available during my care of the patient were reviewed by me and considered in my medical decision making (see chart for details).    MDM Rules/Calculators/A&P                          Pt with negative strep. Diagnosis of viral pharyngitis. No abx indicated at this time. Discharge with symptomatic tx. No evidence of dehydration. Pt is tolerating secretions. Presentation not  concerning for peritonsillar abscess or spread of infection to deep spaces of the throat; patent airway. Patient with 6-10 RBC's in urine. No leukocytosis or bacteria. No history of kidney stones. No current flank pain, nausea or vomiting. Specific return precautions discussed. Recommended PCP follow up. Pt appears safe for discharge. Final Clinical Impression(s) / ED Diagnoses Final diagnoses:  Viral pharyngitis  Asymptomatic microscopic hematuria    Rx / DC Orders ED Discharge Orders  None       Felicie Morn, NP 07/19/20 1813    Charlynne Pander, MD 07/19/20 [phone]

## 2020-08-21 ENCOUNTER — Emergency Department (HOSPITAL_BASED_OUTPATIENT_CLINIC_OR_DEPARTMENT_OTHER)
Admission: EM | Admit: 2020-08-21 | Discharge: 2020-08-21 | Disposition: A | Discharge status: Home / Self Care | Payer: Medicaid Other | Attending: Emergency Medicine | Admitting: Emergency Medicine

## 2020-08-21 ENCOUNTER — Other Ambulatory Visit: Payer: Self-pay

## 2020-08-21 ENCOUNTER — Encounter (HOSPITAL_BASED_OUTPATIENT_CLINIC_OR_DEPARTMENT_OTHER): Payer: Self-pay

## 2020-08-21 DIAGNOSIS — M549 Dorsalgia, unspecified: Secondary | ICD-10-CM

## 2020-08-21 DIAGNOSIS — M546 Pain in thoracic spine: Secondary | ICD-10-CM | POA: Insufficient documentation

## 2020-08-21 HISTORY — DX: Hereditary motor and sensory neuropathy: G60.0

## 2020-08-21 MED ORDER — MELOXICAM 15 MG PO TABS: ORAL_TABLET | ORAL | 0 refills | Status: AC

## 2020-08-21 MED ORDER — NAPROXEN 250 MG PO TABS
500.0000 mg | ORAL_TABLET | Freq: Once | ORAL | Status: AC
  Administered 2020-08-21: 500 mg via ORAL
  Filled 2020-08-21: qty 2

## 2020-08-21 MED ORDER — CYCLOBENZAPRINE HCL 10 MG PO TABS: 10.0000 mg | ORAL_TABLET | Freq: Three times a day (TID) | ORAL | 0 refills | Status: DC | PRN

## 2020-08-21 NOTE — ED Triage Notes (Addendum)
Pt c/o right upper back pain x 2 weeks-no known injury-pain worse with movement-NAD-steady gait

## 2020-08-21 NOTE — ED Provider Notes (Signed)
[facility] Provider Note: Lowella Dell, MD, FACEP  CSN: [id number] MRN: [id number] ARRIVAL: 08/21/20 at 2220 ROOM: MHFT2/[facility]FT2   CHIEF COMPLAINT  Back Pain   HISTORY OF PRESENT ILLNESS  08/21/20 11:23 PM Elizabeth Hooper is a 30 y.o. female with about 2 weeks of right upper back pain.  This began after leaning over to lift something but she did not feel a pop or any other specific injury.  The pain is persisted and is worse with movement.  She rates it as a 7 out of 10.  It is also worse with palpation of the muscles of the upper back as well as movement of the right shoulder.  She states she has been coughing more than usual for the past 2 weeks.  She has taken ibuprofen without adequate relief.   Past Medical History:  Diagnosis Date  . Charcot-Marie disease   . UTI (urinary tract infection) during pregnancy   . Vertigo     Past Surgical History:  Procedure Laterality Date  . CESAREAN SECTION    . CESAREAN SECTION N/A 09/27/2013   Procedure: CESAREAN SECTION WITH BILATERAL TUBAL LIGATION;  Surgeon: Lesly Dukes, MD;  Location: [facility];  Service: Obstetrics;  Laterality: N/A;  . TUBAL LIGATION      Family History  Problem Relation Age of Onset  . Muscular dystrophy Father   . Hypertension Father     Social History   Tobacco Use  . Smoking status: Never Smoker  . Smokeless tobacco: Never Used  Vaping Use  . Vaping Use: Never used  Substance Use Topics  . Alcohol use: Yes    Comment: occ  . Drug use: No    Prior to Admission medications   Medication Sig Start Date End Date Taking? Authorizing Provider  cyclobenzaprine (FLEXERIL) 10 MG tablet Take 1 tablet (10 mg total) by mouth 3 (three) times daily as needed for muscle spasms. 08/21/20  Yes Winn Muehl, MD  meloxicam (MOBIC) 15 MG tablet Take 1 tablet daily as needed for back pain. 08/21/20  Yes Willma Obando, MD  Vitamin D, Ergocalciferol, (DRISDOL) 1.25 MG (50000 UNIT) CAPS capsule Take 50,000  Units by mouth once a week. 12/07/19   [provider]  cetirizine (ZYRTEC ALLERGY) 10 MG tablet Take 1 tablet (10 mg total) by mouth daily. 06/27/18 12/24/18  Gwyneth Sprout, MD  famotidine (PEPCID) 20 MG tablet Take 1 tablet (20 mg total) by mouth 2 (two) times daily. 06/19/18 12/24/18  Henderly, Britni A, PA-C  fluticasone (FLONASE) 50 MCG/ACT nasal spray Place 2 sprays into both nostrils daily. 06/04/18 12/24/18  Michela Pitcher A, PA-C  potassium chloride SA (K-DUR,KLOR-CON) 20 MEQ tablet Take 1 tablet (20 mEq total) by mouth daily. 07/28/18 12/24/18  Petrucelli, Pleas Koch, PA-C  prochlorperazine (COMPAZINE) 10 MG tablet Take 1 tablet (10 mg total) by mouth 2 (two) times daily as needed for nausea or vomiting. 03/12/18 12/24/18  Tegeler, Canary Brim, MD    Allergies Hydrocodone, Penicillins, and Percocet [oxycodone-acetaminophen]   REVIEW OF SYSTEMS  Negative except as noted here or in the History of Present Illness.   PHYSICAL EXAMINATION  Initial Vital Signs Blood pressure 140/76, pulse 96, temperature 99 F (37.2 C), temperature source Oral, resp. rate 16, height 5\' 1"  (1.549 m), weight 101.2 kg, last menstrual period 08/01/2020, SpO2 98 %.  Examination General: Well-developed, well-nourished female in no acute distress; appearance consistent with age of record HENT: normocephalic; atraumatic Eyes: pupils equal, round and reactive  to light; extraocular muscles intact Neck: supple Heart: regular rate and rhythm Lungs: clear to auscultation bilaterally Abdomen: soft; nondistended; nontender; bowel sounds present Back: Right upper back and side muscle tenderness without erythema, swelling or warmth Extremities: No deformity; full range of motion; movement of right shoulder reproduces pain Neurologic: Awake, alert and oriented; motor function intact in all extremities and symmetric; no facial droop Skin: Warm and dry Psychiatric: Normal mood and affect   RESULTS  Summary of  this visit's results, reviewed and interpreted by myself:   EKG Interpretation  Date/Time:    Ventricular Rate:    PR Interval:    QRS Duration:   QT Interval:    QTC Calculation:   R Axis:     Text Interpretation:        Laboratory Studies: No results found for this or any previous visit (from the past 24 hour(s)). Imaging Studies: No results found.  ED COURSE and MDM  Nursing notes, initial and subsequent vitals signs, including pulse oximetry, reviewed and interpreted by myself.  Vitals:   08/21/20 2232 08/21/20 2232 08/21/20 2235  BP:  140/76   Pulse:  96   Resp:  16   Temp:   99 F (37.2 C)  TempSrc:   Oral  SpO2:  98%   Weight: 101.2 kg    Height: 5\' 1"  (1.549 m)     Medications  naproxen (NAPROSYN) tablet 500 mg (has no administration in time range)    Presentation is consistent with musculoskeletal pain.  Will start of an NSAID and Flexeril.  Her coughing may be exacerbating her pain.  She does have a history of Charcot tooth disease but is only minimally effective and has normal muscle tone and movement.  There is no pleuritic component or deep pain to suggest blood clot.  PROCEDURES  Procedures   ED DIAGNOSES     ICD-10-CM   1. Musculoskeletal back pain  M54.9        Angelena Sand, MD 08/21/20 2335

## 2020-08-21 NOTE — ED Notes (Signed)
AVS reviewed with client, discussed pain control, also pt teaching provided on the two medications prescribed to aid in control of back pain. Stressed the importance of safety while taking muscle relaxant that has been prescribed as well. Opportunity for questions provided along with copy of AVS given to patient

## 2020-10-02 ENCOUNTER — Encounter (HOSPITAL_BASED_OUTPATIENT_CLINIC_OR_DEPARTMENT_OTHER): Payer: Self-pay | Admitting: *Deleted

## 2020-10-02 ENCOUNTER — Emergency Department (HOSPITAL_BASED_OUTPATIENT_CLINIC_OR_DEPARTMENT_OTHER)
Admission: EM | Admit: 2020-10-02 | Discharge: 2020-10-02 | Disposition: A | Discharge status: Home / Self Care | Payer: Medicaid Other | Attending: Emergency Medicine | Admitting: Emergency Medicine

## 2020-10-02 ENCOUNTER — Other Ambulatory Visit: Payer: Self-pay

## 2020-10-02 DIAGNOSIS — Z20822 Contact with and (suspected) exposure to covid-19: Secondary | ICD-10-CM | POA: Diagnosis not present

## 2020-10-02 DIAGNOSIS — R059 Cough, unspecified: Secondary | ICD-10-CM | POA: Diagnosis not present

## 2020-10-02 DIAGNOSIS — R509 Fever, unspecified: Secondary | ICD-10-CM | POA: Insufficient documentation

## 2020-10-02 DIAGNOSIS — J029 Acute pharyngitis, unspecified: Secondary | ICD-10-CM | POA: Diagnosis present

## 2020-10-02 DIAGNOSIS — B349 Viral infection, unspecified: Secondary | ICD-10-CM

## 2020-10-02 LAB — RESP PANEL BY RT-PCR (FLU A&B, COVID) ARPGX2
Influenza A by PCR: NEGATIVE
Influenza B by PCR: NEGATIVE
SARS Coronavirus 2 by RT PCR: NEGATIVE

## 2020-10-02 LAB — GROUP A STREP BY PCR: Group A Strep by PCR: NOT DETECTED

## 2020-10-02 NOTE — Discharge Instructions (Signed)
You have been seen and discharged from the emergency department.  Your flu and COVID swab is pending, isolate till you get these results.  Use over-the-counter medication for symptomatic treatment.  Stay well-hydrated.  Follow-up with your primary provider for reevaluation and further care. Take home medications as prescribed. If you have any worsening symptoms or further concerns for your health please return to an emergency department for further evaluation.

## 2020-10-02 NOTE — ED Triage Notes (Signed)
C/o cough and sore throat x 1 week

## 2020-10-02 NOTE — ED Provider Notes (Signed)
[facility] EMERGENCY DEPARTMENT Provider Note   CSN: [id number] Arrival date & time: 10/02/20  2125     History Chief Complaint  Patient presents with  . URI    Elizabeth Hooper is a 30 y.o. female.  HPI   30 year old female presents the emergency department with 1 week of sore throat and nonproductive cough.  At the beginning of her illness she did endorse subjective fever and chills but does not have that currently.  Denies any chest pain, abdominal pain, nausea/vomiting/diarrhea.  Her niece visited earlier and had similar symptoms.  No recent flu or COVID test.  Past Medical History:  Diagnosis Date  . Charcot-Marie disease   . UTI (urinary tract infection) during pregnancy   . Vertigo     Patient Active Problem List   Diagnosis Date Noted  . Normal labor 09/29/2013  . Supervision of high risk pregnancy in third trimester 07/22/2013  . 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 Previous cesarean section complicating pregnancy 07/22/2013  . GBS bacteriuria 07/16/2013    Past Surgical History:  Procedure Laterality Date  . CESAREAN SECTION    . CESAREAN SECTION N/A 09/27/2013   Procedure: CESAREAN SECTION WITH BILATERAL TUBAL LIGATION;  Surgeon: Lesly Dukes, MD;  Location: [facility];  Service: Obstetrics;  Laterality: N/A;  . TUBAL LIGATION       OB History    Gravida  2   Para  2   Term  2   Preterm  0   AB      Living  2     SAB      IAB      Ectopic      Multiple      Live Births  2           Family History  Problem Relation Age of Onset  . Muscular dystrophy Father   . Hypertension Father     Social History   Tobacco Use  . Smoking status: Never Smoker  . Smokeless tobacco: Never Used  Vaping Use  . Vaping Use: Never used  Substance Use Topics  . Alcohol use: Yes    Comment: occ  . Drug use: No    Home Medications Prior to Admission medications   Medication Sig Start Date End Date Taking? Authorizing Provider   cyclobenzaprine (FLEXERIL) 10 MG tablet Take 1 tablet (10 mg total) by mouth 3 (three) times daily as needed for muscle spasms. 08/21/20   Molpus, John, MD  meloxicam (MOBIC) 15 MG tablet Take 1 tablet daily as needed for back pain. 08/21/20   Molpus, John, MD  Vitamin D, Ergocalciferol, (DRISDOL) 1.25 MG (50000 UNIT) CAPS capsule Take 50,000 Units by mouth once a week. 12/07/19   [provider]  cetirizine (ZYRTEC ALLERGY) 10 MG tablet Take 1 tablet (10 mg total) by mouth daily. 06/27/18 12/24/18  Gwyneth Sprout, MD  famotidine (PEPCID) 20 MG tablet Take 1 tablet (20 mg total) by mouth 2 (two) times daily. 06/19/18 12/24/18  Henderly, Britni A, PA-C  fluticasone (FLONASE) 50 MCG/ACT nasal spray Place 2 sprays into both nostrils daily. 06/04/18 12/24/18  Michela Pitcher A, PA-C  potassium chloride SA (K-DUR,KLOR-CON) 20 MEQ tablet Take 1 tablet (20 mEq total) by mouth daily. 07/28/18 12/24/18  Petrucelli, Pleas Koch, PA-C  prochlorperazine (COMPAZINE) 10 MG tablet Take 1 tablet (10 mg total) by mouth 2 (two) times daily as needed for nausea or vomiting. 03/12/18 12/24/18  Tegeler, Canary Brim, MD  Allergies    Hydrocodone, Penicillins, and Percocet [oxycodone-acetaminophen]  Review of Systems   Review of Systems  Constitutional: Positive for chills and fever.  HENT: Positive for sore throat. Negative for congestion and ear pain.   Eyes: Negative for visual disturbance.  Respiratory: Positive for cough. Negative for shortness of breath.   Cardiovascular: Negative for chest pain.  Gastrointestinal: Negative for abdominal pain, diarrhea, nausea and vomiting.  Genitourinary: Negative for dysuria.  Skin: Negative for rash.  Neurological: Negative for headaches.    Physical Exam Updated Vital Signs BP (!) 114/49 (BP Location: Right Arm)   Pulse 93   Temp 99 F (37.2 C) (Oral)   Resp 16   Ht 5\' 1"  (1.549 m)   Wt 104.8 kg   LMP 09/23/2020   SpO2 100%   BMI 43.65 kg/m   Physical  Exam Vitals and nursing note reviewed.  Constitutional:      Appearance: Normal appearance.  HENT:     Head: Normocephalic.     Mouth/Throat:     Mouth: Mucous membranes are moist.     Pharynx: Posterior oropharyngeal erythema present. No oropharyngeal exudate.  Cardiovascular:     Rate and Rhythm: Normal rate.  Pulmonary:     Effort: Pulmonary effort is normal. No respiratory distress.  Abdominal:     Palpations: Abdomen is soft.     Tenderness: There is no abdominal tenderness.  Skin:    General: Skin is warm.  Neurological:     Mental Status: She is alert and oriented to person, place, and time. Mental status is at baseline.  Psychiatric:        Mood and Affect: Mood normal.     ED Results / Procedures / Treatments   Labs (all labs ordered are listed, but only abnormal results are displayed) Labs Reviewed  RESP PANEL BY RT-PCR (FLU A&B, COVID) ARPGX2  GROUP A STREP BY PCR    EKG None  Radiology No results found.  Procedures Procedures   Medications Ordered in ED Medications - No data to display  ED Course  I have reviewed the triage vital signs and the nursing notes.  Pertinent labs & imaging results that were available during my care of the patient were reviewed by me and considered in my medical decision making (see chart for details).    MDM Rules/Calculators/A&P                          30 year old female presents the emergency department with sore throat and dry cough.  Vital signs are normal and stable.  Physical exam is reassuring, strep swab is negative.  Flu and COVID are pending.  Encouraged isolation and symptomatic treatment and following up for flu and COVID swab results.  Patient will be discharged and treated as an outpatient.  Discharge plan and strict return to ED precautions discussed, patient verbalizes understanding and agreement.  Final Clinical Impression(s) / ED Diagnoses Final diagnoses:  None    Rx / DC Orders ED Discharge  Orders    None       26, DO 10/02/20 2314

## 2020-11-26 ENCOUNTER — Emergency Department (HOSPITAL_BASED_OUTPATIENT_CLINIC_OR_DEPARTMENT_OTHER): Payer: Medicaid Other

## 2020-11-26 ENCOUNTER — Encounter (HOSPITAL_BASED_OUTPATIENT_CLINIC_OR_DEPARTMENT_OTHER): Payer: Self-pay | Admitting: Emergency Medicine

## 2020-11-26 ENCOUNTER — Other Ambulatory Visit: Payer: Self-pay

## 2020-11-26 ENCOUNTER — Emergency Department (HOSPITAL_BASED_OUTPATIENT_CLINIC_OR_DEPARTMENT_OTHER)
Admission: EM | Admit: 2020-11-26 | Discharge: 2020-11-26 | Disposition: A | Discharge status: Home / Self Care | Payer: Medicaid Other | Attending: Emergency Medicine | Admitting: Emergency Medicine

## 2020-11-26 DIAGNOSIS — M79641 Pain in right hand: Secondary | ICD-10-CM | POA: Diagnosis not present

## 2020-11-26 DIAGNOSIS — M25531 Pain in right wrist: Secondary | ICD-10-CM | POA: Insufficient documentation

## 2020-11-26 NOTE — ED Triage Notes (Signed)
Pt arrives pov with c/o R hand and wrist pain x 1 month. Pt denies injury. Denies otc meds today

## 2020-11-26 NOTE — Discharge Instructions (Addendum)
Wear the brace when you sleep and when you are at work.  You can take Tylenol or ibuprofen as needed for discomfort.

## 2020-11-26 NOTE — ED Provider Notes (Signed)
[facility] EMERGENCY DEPARTMENT Provider Note   CSN: [id number] Arrival date & time: 11/26/20  1019     History Chief Complaint  Patient presents with   Hand Pain    Elizabeth Hooper is a 30 y.o. female.  The history is provided by the patient.  Hand Pain This is a new problem. Episode onset: 2-3 weeks. The problem occurs constantly. The problem has been gradually worsening. Associated symptoms comments: Pain in the right 2/3 fingers and now starting to radiate into the right wrist.  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  She has been doing this job for the last 3 months.  Prior to that she never had any issue with her hand.  She is right-handed.  Occasionally it seems like it swells.  No new injury.. The symptoms are aggravated by bending and twisting. The symptoms are relieved by rest. She has tried nothing for the symptoms. The treatment provided no relief.      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   Previous cesarean section complicating pregnancy 07/22/2013   GBS bacteriuria 07/16/2013    Past Surgical History:  Procedure Laterality Date   CESAREAN SECTION     CESAREAN SECTION N/A 09/27/2013   Procedure: CESAREAN SECTION WITH BILATERAL TUBAL LIGATION;  Surgeon: Lesly Dukes, MD;  Location: [facility];  Service: Obstetrics;  Laterality: N/A;   TUBAL LIGATION       OB History     Gravida  2   Para  2   Term  2   Preterm  0   AB      Living  2      SAB      IAB      Ectopic      Multiple      Live Births  2           Family History  Problem Relation Age of Onset   Muscular dystrophy Father    Hypertension Father     Social History    Tobacco Use   Smoking status: Never   Smokeless tobacco: Never  Vaping Use   Vaping Use: Never used  Substance Use Topics   Alcohol use: Yes    Comment: occ   Drug use: No    Home Medications Prior to Admission medications   Medication Sig Start Date End Date Taking? Authorizing Provider  cyclobenzaprine (FLEXERIL) 10 MG tablet Take 1 tablet (10 mg total) by mouth 3 (three) times daily as needed for muscle spasms. 08/21/20   Molpus, John, MD  meloxicam (MOBIC) 15 MG tablet Take 1 tablet daily as needed for back pain. 08/21/20   Molpus, John, MD  Vitamin D, Ergocalciferol, (DRISDOL) 1.25 MG (50000 UNIT) CAPS capsule Take 50,000 Units by mouth once a week. 12/07/19   [provider]  cetirizine (ZYRTEC ALLERGY) 10 MG tablet Take 1 tablet (10 mg total) by mouth daily. 06/27/18 12/24/18  Gwyneth Sprout, MD  famotidine (PEPCID) 20 MG tablet Take 1 tablet (20 mg total) by mouth 2 (two) times daily. 06/19/18 12/24/18  Henderly, Britni A, PA-C  fluticasone (FLONASE) 50 MCG/ACT nasal spray Place 2 sprays  into both nostrils daily. 06/04/18 12/24/18  Michela Pitcher A, PA-C  potassium chloride SA (K-DUR,KLOR-CON) 20 MEQ tablet Take 1 tablet (20 mEq total) by mouth daily. 07/28/18 12/24/18  Petrucelli, Pleas Koch, PA-C  prochlorperazine (COMPAZINE) 10 MG tablet Take 1 tablet (10 mg total) by mouth 2 (two) times daily as needed for nausea or vomiting. 03/12/18 12/24/18  Tegeler, Canary Brim, MD    Allergies    Hydrocodone, Penicillins, and Percocet [oxycodone-acetaminophen]  Review of Systems   Review of Systems  All other systems reviewed and are negative.  Physical Exam Updated Vital Signs BP 108/81 (BP Location: Right Arm)   Pulse 77   Temp 98.7 F (37.1 C) (Oral)   Resp 18   Ht 5\' 1"  (1.549 m)   Wt 105.2 kg   LMP 10/20/2020   SpO2 97%   BMI 43.84 kg/m   Physical Exam Vitals and nursing note reviewed.  Constitutional:      General: She is not in acute distress.    Appearance:  Normal appearance. She is normal weight.  HENT:     Head: Normocephalic and atraumatic.     Mouth/Throat:     Mouth: Mucous membranes are moist.  Cardiovascular:     Rate and Rhythm: Normal rate.  Pulmonary:     Effort: Pulmonary effort is normal.  Musculoskeletal:        General: Tenderness present.     Right wrist: No swelling, deformity, tenderness, bony tenderness or snuff box tenderness.     Comments: Tenderness with palpation over the second and third metacarpals without significant swelling.  Mild tenderness along the radial wrist but no palm are wrist tenderness.  Negative Tinel's  Skin:    General: Skin is warm and dry.  Neurological:     Mental Status: She is alert. Mental status is at baseline.  Psychiatric:        Mood and Affect: Mood normal.        Behavior: Behavior normal.    ED Results / Procedures / Treatments   Labs (all labs ordered are listed, but only abnormal results are displayed) Labs Reviewed - No data to display  EKG None  Radiology DG Hand Complete Right  Result Date: 11/26/2020 CLINICAL DATA:  Pain and swelling. EXAM: RIGHT HAND - COMPLETE 3+ VIEW COMPARISON:  None. FINDINGS: There is no evidence of fracture or dislocation. There is no evidence of arthropathy or other focal bone abnormality. Soft tissues are unremarkable. IMPRESSION: Negative. Electronically Signed   By: 11/28/2020 M.D.   On: 11/26/2020 11:51    Procedures Procedures   Medications Ordered in ED Medications - No data to display  ED Course  I have reviewed the triage vital signs and the nursing notes.  Pertinent labs & imaging results that were available during my care of the patient were reviewed by me and considered in my medical decision making (see chart for details).    MDM Rules/Calculators/A&P                          Patient presenting with hand and wrist pain that is worsened over the last 2 to 3 weeks.  Most likely related to recurrent typing for hours at  her job.  No full-blown symptoms of carpal tunnel at this time but starting to affect the right distribution.  Will place patient in a wrist splint.  Encouraged her to take Tylenol or ibuprofen as needed for pain.  Encouraged  her to follow-up with her doctor if the splinting does not help.  Also encouraged her to wear it at night.  Imaging is negative  MDM   Amount and/or Complexity of Data Reviewed Tests in the radiology section of CPT: ordered and reviewed Independent visualization of images, tracings, or specimens: yes      Final Clinical Impression(s) / ED Diagnoses Final diagnoses:  Hand pain, right    Rx / DC Orders ED Discharge Orders     None        Gwyneth Sprout, MD 11/26/20 1213

## 2020-12-23 ENCOUNTER — Emergency Department (HOSPITAL_BASED_OUTPATIENT_CLINIC_OR_DEPARTMENT_OTHER)
Admission: EM | Admit: 2020-12-23 | Discharge: 2020-12-23 | Disposition: A | Discharge status: Home / Self Care | Payer: Medicaid Other | Attending: Emergency Medicine | Admitting: Emergency Medicine

## 2020-12-23 ENCOUNTER — Other Ambulatory Visit: Payer: Self-pay

## 2020-12-23 ENCOUNTER — Encounter (HOSPITAL_BASED_OUTPATIENT_CLINIC_OR_DEPARTMENT_OTHER): Payer: Self-pay | Admitting: Emergency Medicine

## 2020-12-23 DIAGNOSIS — N9089 Other specified noninflammatory disorders of vulva and perineum: Secondary | ICD-10-CM | POA: Diagnosis present

## 2020-12-23 DIAGNOSIS — Z202 Contact with and (suspected) exposure to infections with a predominantly sexual mode of transmission: Secondary | ICD-10-CM | POA: Insufficient documentation

## 2020-12-23 LAB — HIV ANTIBODY (ROUTINE TESTING W REFLEX): HIV Screen 4th Generation wRfx: NONREACTIVE

## 2020-12-23 MED ORDER — VALACYCLOVIR HCL 500 MG PO TABS: 500.0000 mg | ORAL_TABLET | Freq: Once | ORAL | Status: DC

## 2020-12-23 MED ORDER — VALACYCLOVIR HCL 500 MG PO TABS
1000.0000 mg | ORAL_TABLET | Freq: Once | ORAL | Status: DC
  Filled 2020-12-23: qty 2

## 2020-12-23 MED ORDER — VALACYCLOVIR HCL 1 G PO TABS: 1000.0000 mg | ORAL_TABLET | Freq: Two times a day (BID) | ORAL | 0 refills | Status: AC

## 2020-12-23 NOTE — ED Triage Notes (Signed)
Patient presents with complaints of exposure to herpes; states vaginal sores noted x 1 week; denies vaginal bleeding or discharge.

## 2020-12-23 NOTE — ED Provider Notes (Signed)
[facility] EMERGENCY DEPARTMENT Provider Note   CSN: [id number] Arrival date & time: 12/23/20  0221     History Chief Complaint  Patient presents with   SEXUALLY TRANSMITTED DISEASE   Exposure to STD    Elizabeth Hooper is a 30 y.o. female.  Patient with a painful lesion to upper inner labia. Been tehre fora few days. Sexually active with her boyfriend who is only sexually active with her apparently. No other partners. No h/o same.     Exposure to STD      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   Previous cesarean section complicating pregnancy 07/22/2013   GBS bacteriuria 07/16/2013    Past Surgical History:  Procedure Laterality Date   CESAREAN SECTION     CESAREAN SECTION N/A 09/27/2013   Procedure: CESAREAN SECTION WITH BILATERAL TUBAL LIGATION;  Surgeon: Lesly Dukes, MD;  Location: [facility];  Service: Obstetrics;  Laterality: N/A;   TUBAL LIGATION       OB History     Gravida  2   Para  2   Term  2   Preterm  0   AB      Living  2      SAB      IAB      Ectopic      Multiple      Live Births  2           Family History  Problem Relation Age of Onset   Muscular dystrophy Father    Hypertension Father     Social History   Tobacco Use   Smoking status: Never   Smokeless tobacco: Never  Vaping Use   Vaping Use: Never used  Substance Use Topics   Alcohol use: Yes    Comment: occ   Drug use: No    Home Medications Prior to Admission medications   Medication Sig Start Date End Date Taking? Authorizing Provider  valACYclovir (VALTREX) 1000 MG tablet Take 1 tablet (1,000 mg total) by mouth 2 (two) times daily for 10 days. 12/23/20 01/02/21 Yes  Olander Friedl, Barbara Cower, MD  cyclobenzaprine (FLEXERIL) 10 MG tablet Take 1 tablet (10 mg total) by mouth 3 (three) times daily as needed for muscle spasms. 08/21/20   Molpus, John, MD  meloxicam (MOBIC) 15 MG tablet Take 1 tablet daily as needed for back pain. 08/21/20   Molpus, John, MD  Vitamin D, Ergocalciferol, (DRISDOL) 1.25 MG (50000 UNIT) CAPS capsule Take 50,000 Units by mouth once a week. 12/07/19   [provider]  cetirizine (ZYRTEC ALLERGY) 10 MG tablet Take 1 tablet (10 mg total) by mouth daily. 06/27/18 12/24/18  Gwyneth Sprout, MD  famotidine (PEPCID) 20 MG tablet Take 1 tablet (20 mg total) by mouth 2 (two) times daily. 06/19/18 12/24/18  Henderly, Britni A, PA-C  fluticasone (FLONASE) 50 MCG/ACT nasal spray Place 2 sprays into both nostrils daily. 06/04/18 12/24/18  Michela Pitcher A, PA-C  potassium chloride SA (K-DUR,KLOR-CON) 20 MEQ tablet Take 1 tablet (20 mEq total) by mouth daily. 07/28/18 12/24/18  Petrucelli, Pleas Koch, PA-C  prochlorperazine (COMPAZINE) 10 MG tablet Take 1 tablet (10 mg total) by mouth 2 (two) times daily  as needed for nausea or vomiting. 03/12/18 12/24/18  Tegeler, Canary Brim, MD    Allergies    Hydrocodone, Penicillins, and Percocet [oxycodone-acetaminophen]  Review of Systems   Review of Systems  All other systems reviewed and are negative.  Physical Exam Updated Vital Signs BP 120/68 (BP Location: Right Arm)   Pulse 82   Temp 98.2 F (36.8 C) (Oral)   Resp 18   Ht 5\' 1"  (1.549 m)   Wt 105.2 kg   LMP 12/16/2020   SpO2 99%   BMI 43.82 kg/m   Physical Exam Vitals and nursing note reviewed.  Constitutional:      Appearance: She is well-developed.  HENT:     Head: Normocephalic and atraumatic.     Mouth/Throat:     Mouth: Mucous membranes are moist.     Pharynx: Oropharynx is clear.  Eyes:     Pupils: Pupils are equal, round, and reactive to light.  Cardiovascular:     Rate and Rhythm: Normal rate and regular rhythm.  Pulmonary:      Effort: No respiratory distress.     Breath sounds: No stridor.  Abdominal:     General: Abdomen is flat. There is no distension.  Genitourinary:    Comments: Chaperoned by nurse 02/16/2021 Ulcer to upper inner labia Musculoskeletal:     Cervical back: Normal range of motion.  Skin:    General: Skin is warm and dry.  Neurological:     General: No focal deficit present.     Mental Status: She is alert.    ED Results / Procedures / Treatments   Labs (all labs ordered are listed, but only abnormal results are displayed) Labs Reviewed  RPR  HIV ANTIBODY (ROUTINE TESTING W REFLEX)  GC/CHLAMYDIA PROBE AMP ([place]) NOT AT [facility]    EKG None  Radiology No results found.  Procedures Procedures   Medications Ordered in ED Medications  valACYclovir (VALTREX) tablet 1,000 mg (1,000 mg Oral Not Given 12/23/20 0345)    ED Course  I have reviewed the triage vital signs and the nursing notes.  Pertinent labs & imaging results that were available during my care of the patient were reviewed by me and considered in my medical decision making (see chart for details).    MDM Rules/Calculators/A&P                          Swabbed for HSV, treat for same. Other STD's checked as well, no e/o PID requiring ppx treatmetn.   Final Clinical Impression(s) / ED Diagnoses Final diagnoses:  Possible exposure to STD    Rx / DC Orders ED Discharge Orders          Ordered    valACYclovir (VALTREX) 1000 MG tablet  2 times daily        12/23/20 0321             Klani Caridi, 12/25/20, MD 12/23/20 [phone]

## 2020-12-24 LAB — RPR: RPR Ser Ql: NONREACTIVE

## 2020-12-25 LAB — GC/CHLAMYDIA PROBE AMP (~~LOC~~) NOT AT ARMC
Chlamydia: NEGATIVE
Comment: NEGATIVE
Comment: NORMAL
Neisseria Gonorrhea: NEGATIVE

## 2021-01-09 ENCOUNTER — Encounter (HOSPITAL_BASED_OUTPATIENT_CLINIC_OR_DEPARTMENT_OTHER): Payer: Self-pay | Admitting: *Deleted

## 2021-01-09 ENCOUNTER — Emergency Department (HOSPITAL_BASED_OUTPATIENT_CLINIC_OR_DEPARTMENT_OTHER)
Admission: EM | Admit: 2021-01-09 | Discharge: 2021-01-09 | Disposition: A | Discharge status: Home / Self Care | Payer: Medicaid Other | Attending: Emergency Medicine | Admitting: Emergency Medicine

## 2021-01-09 ENCOUNTER — Other Ambulatory Visit: Payer: Self-pay

## 2021-01-09 DIAGNOSIS — B349 Viral infection, unspecified: Secondary | ICD-10-CM

## 2021-01-09 DIAGNOSIS — R509 Fever, unspecified: Secondary | ICD-10-CM | POA: Diagnosis present

## 2021-01-09 DIAGNOSIS — Z28310 Unvaccinated for covid-19: Secondary | ICD-10-CM | POA: Insufficient documentation

## 2021-01-09 DIAGNOSIS — U071 COVID-19: Secondary | ICD-10-CM | POA: Diagnosis not present

## 2021-01-09 LAB — URINALYSIS, ROUTINE W REFLEX MICROSCOPIC
Bilirubin Urine: NEGATIVE
Glucose, UA: NEGATIVE mg/dL
Ketones, ur: NEGATIVE mg/dL
Leukocytes,Ua: NEGATIVE
Nitrite: NEGATIVE
Protein, ur: NEGATIVE mg/dL
Specific Gravity, Urine: <1.005 — ABNORMAL LOW (ref 1.005–1.030)
pH: 6.5 (ref 5.0–8.0)

## 2021-01-09 LAB — URINALYSIS, MICROSCOPIC (REFLEX): WBC, UA: NONE SEEN WBC/hpf (ref 0–5)

## 2021-01-09 LAB — PREGNANCY, URINE: Preg Test, Ur: NEGATIVE

## 2021-01-09 LAB — GROUP A STREP BY PCR: Group A Strep by PCR: NOT DETECTED

## 2021-01-09 LAB — RESP PANEL BY RT-PCR (FLU A&B, COVID) ARPGX2
Influenza A by PCR: NEGATIVE
Influenza B by PCR: NEGATIVE
SARS Coronavirus 2 by RT PCR: POSITIVE — AB

## 2021-01-09 MED ORDER — ACETAMINOPHEN 325 MG PO TABS
ORAL_TABLET | ORAL | Status: AC
  Filled 2021-01-09: qty 2

## 2021-01-09 MED ORDER — BENZONATATE 100 MG PO CAPS: 100.0000 mg | ORAL_CAPSULE | Freq: Three times a day (TID) | ORAL | 0 refills | Status: DC

## 2021-01-09 MED ORDER — ACETAMINOPHEN 325 MG PO TABS
650.0000 mg | ORAL_TABLET | Freq: Once | ORAL | Status: AC | PRN
  Administered 2021-01-09: 650 mg via ORAL

## 2021-01-09 NOTE — ED Provider Notes (Signed)
[facility] EMERGENCY DEPARTMENT Provider Note   CSN: [id number] Arrival date & time: 01/09/21  1830     History Chief Complaint  Patient presents with   Fever    Elizabeth Hooper is a 30 y.o. female presenting for evaluation of cough, sore throat, fever, body aches, urinary frequency.  Patient states her symptoms began yesterday.  She has not taken anything except NyQuil for her symptoms.  She denies sick contacts.  She is not vaccinated for COVID.  She denies chest pain or shortness of breath.  No nausea or vomiting or abdominal pain.  She has no other medical problems, takes no medications daily.  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   Previous cesarean section complicating pregnancy 07/22/2013   GBS bacteriuria 07/16/2013    Past Surgical History:  Procedure Laterality Date   CESAREAN SECTION     CESAREAN SECTION N/A 09/27/2013   Procedure: CESAREAN SECTION WITH BILATERAL TUBAL LIGATION;  Surgeon: Lesly Dukes, MD;  Location: [facility];  Service: Obstetrics;  Laterality: N/A;   TUBAL LIGATION       OB History     Gravida  2   Para  2   Term  2   Preterm  0   AB      Living  2      SAB      IAB      Ectopic      Multiple      Live Births  2           Family History  Problem Relation Age of Onset   Muscular dystrophy Father    Hypertension Father     Social History   Tobacco Use   Smoking status: Never   Smokeless tobacco: Never  Vaping Use   Vaping Use: Never used  Substance Use Topics   Alcohol use: Yes    Comment: occ   Drug use: No    Home Medications Prior to Admission medications   Medication Sig Start Date End Date Taking?  Authorizing Provider  benzonatate (TESSALON) 100 MG capsule Take 1 capsule (100 mg total) by mouth every 8 (eight) hours. 01/09/21  Yes Laray Rivkin, PA-C  cyclobenzaprine (FLEXERIL) 10 MG tablet Take 1 tablet (10 mg total) by mouth 3 (three) times daily as needed for muscle spasms. 08/21/20   Molpus, John, MD  meloxicam (MOBIC) 15 MG tablet Take 1 tablet daily as needed for back pain. 08/21/20   Molpus, John, MD  Vitamin D, Ergocalciferol, (DRISDOL) 1.25 MG (50000 UNIT) CAPS capsule Take 50,000 Units by mouth once a week. 12/07/19   [provider]  cetirizine (ZYRTEC ALLERGY) 10 MG tablet Take 1 tablet (10 mg total) by mouth daily. 06/27/18 12/24/18  Gwyneth Sprout, MD  famotidine (PEPCID) 20 MG tablet Take 1 tablet (20 mg total) by mouth 2 (two) times daily. 06/19/18 12/24/18  Henderly, Britni A, PA-C  fluticasone (FLONASE) 50 MCG/ACT nasal spray Place 2 sprays into both nostrils daily. 06/04/18 12/24/18  Michela Pitcher A, PA-C  potassium chloride SA (K-DUR,KLOR-CON) 20 MEQ tablet Take 1 tablet (20 mEq total) by mouth daily. 07/28/18 12/24/18  Petrucelli, Lelon Mast  R, PA-C  prochlorperazine (COMPAZINE) 10 MG tablet Take 1 tablet (10 mg total) by mouth 2 (two) times daily as needed for nausea or vomiting. 03/12/18 12/24/18  Tegeler, Canary Brim, MD    Allergies    Hydrocodone, Penicillins, and Percocet [oxycodone-acetaminophen]  Review of Systems   Review of Systems  Constitutional:  Positive for fever.  HENT:  Positive for sore throat.   Respiratory:  Positive for cough.   Genitourinary:  Positive for frequency. Negative for dysuria and hematuria.  All other systems reviewed and are negative.  Physical Exam Updated Vital Signs BP 120/77 (BP Location: Right Arm)   Pulse (!) 114   Temp (!) 100.4 F (38 C) (Oral)   Resp (!) 22   Ht 5\' 1"  (1.549 m)   Wt 105.2 kg   LMP 12/16/2020   SpO2 99%   BMI 43.84 kg/m   Physical Exam Vitals and nursing note reviewed.  Constitutional:       General: She is not in acute distress.    Appearance: Normal appearance.     Comments: Resting in the chair no acute distress  HENT:     Head: Normocephalic and atraumatic.  Eyes:     Conjunctiva/sclera: Conjunctivae normal.     Pupils: Pupils are equal, round, and reactive to light.  Cardiovascular:     Rate and Rhythm: Normal rate and regular rhythm.     Pulses: Normal pulses.  Pulmonary:     Effort: Pulmonary effort is normal. No respiratory distress.     Breath sounds: Normal breath sounds. No wheezing.     Comments: Speaking in full sentences.  Clear lung sounds in all fields. Abdominal:     General: There is no distension.     Palpations: Abdomen is soft.     Tenderness: There is no abdominal tenderness.  Musculoskeletal:        General: Normal range of motion.     Cervical back: Normal range of motion and neck supple.  Skin:    General: Skin is warm and dry.     Capillary Refill: Capillary refill takes less than 2 seconds.  Neurological:     Mental Status: She is alert and oriented to person, place, and time.  Psychiatric:        Mood and Affect: Mood and affect normal.        Speech: Speech normal.        Behavior: Behavior normal.    ED Results / Procedures / Treatments   Labs (all labs ordered are listed, but only abnormal results are displayed) Labs Reviewed  URINALYSIS, ROUTINE W REFLEX MICROSCOPIC - Abnormal; Notable for the following components:      Result Value   Specific Gravity, Urine <1.005 (*)    Hgb urine dipstick MODERATE (*)    All other components within normal limits  URINALYSIS, MICROSCOPIC (REFLEX) - Abnormal; Notable for the following components:   Bacteria, UA RARE (*)    All other components within normal limits  GROUP A STREP BY PCR  RESP PANEL BY RT-PCR (FLU A&B, COVID) ARPGX2  PREGNANCY, URINE    EKG None  Radiology No results found.  Procedures Procedures   Medications Ordered in ED Medications  acetaminophen (TYLENOL)  tablet 650 mg (650 mg Oral Given 01/09/21 1849)    ED Course  I have reviewed the triage vital signs and the nursing notes.  Pertinent labs & imaging results that were available during my care of the patient were reviewed by  me and considered in my medical decision making (see chart for details).    MDM Rules/Calculators/A&P                           Patient presenting with 1 day h/o viral symptoms.  Physical exam reassuring, patient is afebrile and appears nontoxic.  Pulmonary exam reassuring.  Doubt pneumonia, strep, other bacterial infection, or peritonsillar abscess. Strep testing negative. Will test for covid and flu. Likely viral URI.  However in the setting of urinary frequency and fever, will also check urine to ensure no infection.  Urine negative for infection.  As such, likely viral illness.  Will treat symptomatically. HR improved with antipyretics as expected. Patient to follow-up with primary care as needed.  At this time, patient appears safe for discharge.  Return precautions given.  Patient states she  understands and agrees to plan.  Final Clinical Impression(s) / ED Diagnoses Final diagnoses:  Viral illness    Rx / DC Orders ED Discharge Orders          Ordered    benzonatate (TESSALON) 100 MG capsule  Every 8 hours        01/09/21 2013             Alveria Apley, PA-C 01/09/21 2019    Melene Plan, DO 01/09/21 2252

## 2021-01-09 NOTE — Discharge Instructions (Signed)
You likely have a viral illness.  This should be treated symptomatically. Your COVID and flu test are pending.  You can follow-up regarding the results in MyChart. Use Tylenol or ibuprofen as needed for fevers or body aches. Use Tessalon as needed for cough. Make sure you stay well-hydrated with water. Wash your hands frequently to prevent spread of infection. Follow-up with your primary care doctor in 1 week if your symptoms are not improving. Return to the emergency room if you develop chest pain, difficulty breathing, or any new or worsening symptoms.

## 2021-01-09 NOTE — ED Triage Notes (Signed)
C/o fever , cough , body aches , sore throat x 2 days

## 2021-02-07 ENCOUNTER — Encounter (HOSPITAL_BASED_OUTPATIENT_CLINIC_OR_DEPARTMENT_OTHER): Payer: Self-pay

## 2021-02-07 ENCOUNTER — Emergency Department (HOSPITAL_BASED_OUTPATIENT_CLINIC_OR_DEPARTMENT_OTHER)
Admission: EM | Admit: 2021-02-07 | Discharge: 2021-02-07 | Disposition: A | Discharge status: Home / Self Care | Payer: Medicaid Other | Attending: Emergency Medicine | Admitting: Emergency Medicine

## 2021-02-07 ENCOUNTER — Other Ambulatory Visit: Payer: Self-pay

## 2021-02-07 DIAGNOSIS — Z8616 Personal history of COVID-19: Secondary | ICD-10-CM | POA: Diagnosis not present

## 2021-02-07 DIAGNOSIS — R002 Palpitations: Secondary | ICD-10-CM | POA: Diagnosis present

## 2021-02-07 LAB — COMPREHENSIVE METABOLIC PANEL
ALT: 22 U/L (ref 0–44)
AST: 19 U/L (ref 15–41)
Albumin: 3.8 g/dL (ref 3.5–5.0)
Alkaline Phosphatase: 71 U/L (ref 38–126)
Anion gap: 7 (ref 5–15)
BUN: 9 mg/dL (ref 6–20)
CO2: 23 mmol/L (ref 22–32)
Calcium: 9.1 mg/dL (ref 8.9–10.3)
Chloride: 105 mmol/L (ref 98–111)
Creatinine, Ser: 0.52 mg/dL (ref 0.44–1.00)
GFR, Estimated: >60 mL/min (ref >60)
Glucose, Bld: 114 mg/dL — ABNORMAL HIGH (ref 70–99)
Potassium: 3.7 mmol/L (ref 3.5–5.1)
Sodium: 135 mmol/L (ref 135–145)
Total Bilirubin: 0.3 mg/dL (ref 0.3–1.2)
Total Protein: 7.3 g/dL (ref 6.5–8.1)

## 2021-02-07 LAB — CBC WITH DIFFERENTIAL/PLATELET
Abs Immature Granulocytes: 0.04 10*3/uL (ref 0.00–0.07)
Basophils Absolute: 0 10*3/uL (ref 0.0–0.1)
Basophils Relative: 0 %
Eosinophils Absolute: 0.1 10*3/uL (ref 0.0–0.5)
Eosinophils Relative: 1 %
HCT: 38.8 % (ref 36.0–46.0)
Hemoglobin: 12.6 g/dL (ref 12.0–15.0)
Immature Granulocytes: 0 %
Lymphocytes Relative: 29 %
Lymphs Abs: 2.6 10*3/uL (ref 0.7–4.0)
MCH: 26.6 pg (ref 26.0–34.0)
MCHC: 32.5 g/dL (ref 30.0–36.0)
MCV: 81.9 fL (ref 80.0–100.0)
Monocytes Absolute: 0.5 10*3/uL (ref 0.1–1.0)
Monocytes Relative: 5 %
Neutro Abs: 5.9 10*3/uL (ref 1.7–7.7)
Neutrophils Relative %: 65 %
Platelets: 239 10*3/uL (ref 150–400)
RBC: 4.74 MIL/uL (ref 3.87–5.11)
RDW: 14 % (ref 11.5–15.5)
WBC: 9.2 10*3/uL (ref 4.0–10.5)
nRBC: 0 % (ref 0.0–0.2)

## 2021-02-07 LAB — MAGNESIUM: Magnesium: 1.8 mg/dL (ref 1.7–2.4)

## 2021-02-07 NOTE — Discharge Instructions (Addendum)
It is very important that you follow-up with your primary care doctor for recheck of your symptoms and further evaluation of the palpitations. If you are unable to see your primary care doctor or if there is no answer with your primary care doctor, follow-up with a cardiologist for further evaluation of your palpitations. Make sure you stay well-hydrated with water. Try and decrease your caffeine intake to help with symptoms. Return to the emergency room if you develop chest pain, difficulty breathing, feeling like you are going to pass out, or any new worsening or concerning symptoms

## 2021-02-07 NOTE — ED Provider Notes (Signed)
[facility] EMERGENCY DEPARTMENT Provider Note   CSN: [id number] Arrival date & time: 02/07/21  0945     History Chief Complaint  Patient presents with   Palpitations    Elizabeth Hooper is a 30 y.o. female presenting for evaluation of palpitations.  Patient states that this past week she has had intermittent palpitations.  It usually last for few seconds and then resolves without intervention.  More often happening in the morning.  She denies any associated symptoms including chest pain, shortness of breath, dizziness or lightheadedness.  No illness in the past week, although she did have COVID about 3 to 4 weeks ago.  She denies symptoms during my evaluation.  No recent nausea, vomiting, Donnell pain or change in bowel movements.  Has a history of anemia and low vitamin D, takes supplementation for this.  She has had palpitations before, several years ago, at that time there is questionable thyroid abnormality.  However her symptoms resolved and she did not continue to follow-up.  No change in caffeine intake or sleep intake.  No recent new medications.    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   Previous cesarean section complicating pregnancy 07/22/2013   GBS bacteriuria 07/16/2013    Past Surgical History:  Procedure Laterality Date   CESAREAN SECTION     CESAREAN SECTION N/A 09/27/2013   Procedure: CESAREAN SECTION WITH BILATERAL TUBAL LIGATION;  Surgeon: Lesly Dukes, MD;  Location: [facility];  Service: Obstetrics;  Laterality: N/A;   TUBAL LIGATION       OB History     Gravida  2   Para  2   Term  2   Preterm  0   AB      Living  2      SAB      IAB      Ectopic       Multiple      Live Births  2           Family History  Problem Relation Age of Onset   Muscular dystrophy Father    Hypertension Father     Social History   Tobacco Use   Smoking status: Never   Smokeless tobacco: Never  Vaping Use   Vaping Use: Never used  Substance Use Topics   Alcohol use: Yes    Comment: occ   Drug use: No    Home Medications Prior to Admission medications   Medication Sig Start Date End Date Taking? Authorizing Provider  benzonatate (TESSALON) 100 MG capsule Take 1 capsule (100 mg total) by mouth every 8 (eight) hours. 01/09/21   Sonoma Firkus, PA-C  cyclobenzaprine (FLEXERIL) 10 MG tablet Take 1 tablet (10 mg total) by mouth 3 (three) times daily as needed for muscle spasms. 08/21/20   Molpus, John, MD  meloxicam (MOBIC) 15 MG tablet Take 1 tablet daily as needed for back pain. 08/21/20   Molpus, John, MD  Vitamin D, Ergocalciferol, (DRISDOL) 1.25 MG (50000 UNIT) CAPS capsule Take 50,000 Units by mouth once a week. 12/07/19   [provider]  cetirizine (ZYRTEC ALLERGY) 10 MG tablet Take 1 tablet (10 mg total)  by mouth daily. 06/27/18 12/24/18  Gwyneth Sprout, MD  famotidine (PEPCID) 20 MG tablet Take 1 tablet (20 mg total) by mouth 2 (two) times daily. 06/19/18 12/24/18  Henderly, Britni A, PA-C  fluticasone (FLONASE) 50 MCG/ACT nasal spray Place 2 sprays into both nostrils daily. 06/04/18 12/24/18  Michela Pitcher A, PA-C  potassium chloride SA (K-DUR,KLOR-CON) 20 MEQ tablet Take 1 tablet (20 mEq total) by mouth daily. 07/28/18 12/24/18  Petrucelli, Pleas Koch, PA-C  prochlorperazine (COMPAZINE) 10 MG tablet Take 1 tablet (10 mg total) by mouth 2 (two) times daily as needed for nausea or vomiting. 03/12/18 12/24/18  Tegeler, Canary Brim, MD    Allergies    Hydrocodone, Penicillins, and Percocet [oxycodone-acetaminophen]  Review of Systems   Review of Systems  Cardiovascular:  Positive for palpitations (intermittent, none currently).  All  other systems reviewed and are negative.  Physical Exam Updated Vital Signs BP 105/73   Pulse 91   Temp 98.8 F (37.1 C) (Oral)   Resp (!) 22   Ht 5\' 1"  (1.549 m)   Wt 104.3 kg   LMP 01/20/2021 (Approximate)   SpO2 100%   BMI 43.46 kg/m   Physical Exam Vitals and nursing note reviewed.  Constitutional:      General: She is not in acute distress.    Appearance: Normal appearance. She is obese.  HENT:     Head: Normocephalic and atraumatic.  Eyes:     Conjunctiva/sclera: Conjunctivae normal.     Pupils: Pupils are equal, round, and reactive to light.  Cardiovascular:     Rate and Rhythm: Normal rate and regular rhythm.     Pulses: Normal pulses.  Pulmonary:     Effort: Pulmonary effort is normal. No respiratory distress.     Breath sounds: Normal breath sounds. No wheezing.     Comments: Speaking in full sentences.  Clear lung sounds in all fields. Abdominal:     General: There is no distension.     Palpations: Abdomen is soft. There is no mass.     Tenderness: There is no abdominal tenderness. There is no guarding or rebound.  Musculoskeletal:        General: Normal range of motion.     Cervical back: Normal range of motion and neck supple.     Right lower leg: No edema.     Left lower leg: No edema.  Skin:    General: Skin is warm and dry.     Capillary Refill: Capillary refill takes less than 2 seconds.  Neurological:     Mental Status: She is alert and oriented to person, place, and time.  Psychiatric:        Mood and Affect: Mood and affect normal.        Speech: Speech normal.        Behavior: Behavior normal.    ED Results / Procedures / Treatments   Labs (all labs ordered are listed, but only abnormal results are displayed) Labs Reviewed  COMPREHENSIVE METABOLIC PANEL - Abnormal; Notable for the following components:      Result Value   Glucose, Bld 114 (*)    All other components within normal limits  CBC WITH DIFFERENTIAL/PLATELET  MAGNESIUM     EKG EKG Interpretation  Date/Time:  Wednesday February 07 2021 09:56:31 EDT Ventricular Rate:  102 PR Interval:  144 QRS Duration: 104 QT Interval:  355 QTC Calculation: 463 R Axis:   82 Text Interpretation: Sinus tachycardia Low voltage, extremity and precordial leads  No acute changes No significant change since last tracing Confirmed by Derwood Kaplan [phone]) on 02/07/2021 1:44:12 PM  Radiology No results found.  Procedures Procedures   Medications Ordered in ED Medications - No data to display  ED Course  I have reviewed the triage vital signs and the nursing notes.  Pertinent labs & imaging results that were available during my care of the patient were reviewed by me and considered in my medical decision making (see chart for details).    MDM Rules/Calculators/A&P                           Patient present evaluation of intermittent palpitations.  On exam, patient appears nontoxic.  Regular rate and rhythm on my exam.  Consider electrolyte abnormality, arrhythmia, anemia.  Consider thyroid abnormality.  Will check labs.  Unfortunately TSH is a send out test at this facility, as such will not check today.  Labs interpreted by me, overall reassuring.  No significant electrolyte abnormality.  No anemia.  EKG shows mild sinus tach at 102, however on my evaluation patient is not tachycardic.  She has been able to ambulate without shortness of breath, chest pain, palpitations, or dizziness/lightheadedness.  Encourage patient to follow-up with her PCP for further evaluation of the palpitations.  Also given resources to follow-up with cardiology if symptoms persist.  At this time, patient appears safe for discharge.  Return precautions given.  Patient states she understands and agrees to plan.   Final Clinical Impression(s) / ED Diagnoses Final diagnoses:  Palpitations    Rx / DC Orders ED Discharge Orders     None        Alveria Apley, PA-C 02/07/21 1557     Derwood Kaplan, MD 02/08/21 [phone]

## 2021-02-07 NOTE — ED Triage Notes (Signed)
Pt c/o palpitations starting while sitting at work. Denies chest pain/sob. States hx of hypokalemia.

## 2021-03-23 ENCOUNTER — Emergency Department (HOSPITAL_BASED_OUTPATIENT_CLINIC_OR_DEPARTMENT_OTHER)
Admission: EM | Admit: 2021-03-23 | Discharge: 2021-03-23 | Disposition: A | Discharge status: Home / Self Care | Payer: Medicaid Other | Attending: Emergency Medicine | Admitting: Emergency Medicine

## 2021-03-23 ENCOUNTER — Other Ambulatory Visit: Payer: Self-pay

## 2021-03-23 ENCOUNTER — Encounter (HOSPITAL_BASED_OUTPATIENT_CLINIC_OR_DEPARTMENT_OTHER): Payer: Self-pay | Admitting: Emergency Medicine

## 2021-03-23 DIAGNOSIS — Z5321 Procedure and treatment not carried out due to patient leaving prior to being seen by health care provider: Secondary | ICD-10-CM | POA: Insufficient documentation

## 2021-03-23 DIAGNOSIS — R42 Dizziness and giddiness: Secondary | ICD-10-CM | POA: Insufficient documentation

## 2021-03-23 LAB — CBC
HCT: 39.7 % (ref 36.0–46.0)
Hemoglobin: 12.9 g/dL (ref 12.0–15.0)
MCH: 26.9 pg (ref 26.0–34.0)
MCHC: 32.5 g/dL (ref 30.0–36.0)
MCV: 82.9 fL (ref 80.0–100.0)
Platelets: 286 10*3/uL (ref 150–400)
RBC: 4.79 MIL/uL (ref 3.87–5.11)
RDW: 13.5 % (ref 11.5–15.5)
WBC: 12.4 10*3/uL — ABNORMAL HIGH (ref 4.0–10.5)
nRBC: 0 % (ref 0.0–0.2)

## 2021-03-23 LAB — URINALYSIS, ROUTINE W REFLEX MICROSCOPIC
Bilirubin Urine: NEGATIVE
Glucose, UA: NEGATIVE mg/dL
Ketones, ur: NEGATIVE mg/dL
Leukocytes,Ua: NEGATIVE
Nitrite: NEGATIVE
Protein, ur: NEGATIVE mg/dL
Specific Gravity, Urine: 1.03 (ref 1.005–1.030)
pH: 6 (ref 5.0–8.0)

## 2021-03-23 LAB — URINALYSIS, MICROSCOPIC (REFLEX)

## 2021-03-23 LAB — BASIC METABOLIC PANEL
Anion gap: 9 (ref 5–15)
BUN: 9 mg/dL (ref 6–20)
CO2: 23 mmol/L (ref 22–32)
Calcium: 8.8 mg/dL — ABNORMAL LOW (ref 8.9–10.3)
Chloride: 102 mmol/L (ref 98–111)
Creatinine, Ser: 0.58 mg/dL (ref 0.44–1.00)
GFR, Estimated: >60 mL/min (ref >60)
Glucose, Bld: 100 mg/dL — ABNORMAL HIGH (ref 70–99)
Potassium: 3.5 mmol/L (ref 3.5–5.1)
Sodium: 134 mmol/L — ABNORMAL LOW (ref 135–145)

## 2021-03-23 LAB — PREGNANCY, URINE: Preg Test, Ur: NEGATIVE

## 2021-03-23 LAB — CBG MONITORING, ED: Glucose-Capillary: 93 mg/dL (ref 70–99)

## 2021-03-23 NOTE — ED Triage Notes (Signed)
Pt presents to ED POV. Pt c/o dizziness x25m. Pt reports that this has happened before and her K+ was low and another time her BS was high. Pt ambulatory to triage w/o difficulty

## 2021-04-21 ENCOUNTER — Encounter (HOSPITAL_BASED_OUTPATIENT_CLINIC_OR_DEPARTMENT_OTHER): Payer: Self-pay | Admitting: *Deleted

## 2021-04-21 ENCOUNTER — Emergency Department (HOSPITAL_BASED_OUTPATIENT_CLINIC_OR_DEPARTMENT_OTHER)
Admission: EM | Admit: 2021-04-21 | Discharge: 2021-04-21 | Disposition: A | Discharge status: Home / Self Care | Payer: Medicaid Other | Attending: Emergency Medicine | Admitting: Emergency Medicine

## 2021-04-21 ENCOUNTER — Emergency Department (HOSPITAL_BASED_OUTPATIENT_CLINIC_OR_DEPARTMENT_OTHER): Payer: Medicaid Other

## 2021-04-21 ENCOUNTER — Other Ambulatory Visit: Payer: Self-pay

## 2021-04-21 DIAGNOSIS — R1084 Generalized abdominal pain: Secondary | ICD-10-CM | POA: Diagnosis not present

## 2021-04-21 DIAGNOSIS — R1013 Epigastric pain: Secondary | ICD-10-CM

## 2021-04-21 LAB — COMPREHENSIVE METABOLIC PANEL
ALT: 26 U/L (ref 0–44)
AST: 20 U/L (ref 15–41)
Albumin: 3.9 g/dL (ref 3.5–5.0)
Alkaline Phosphatase: 81 U/L (ref 38–126)
Anion gap: 9 (ref 5–15)
BUN: 11 mg/dL (ref 6–20)
CO2: 24 mmol/L (ref 22–32)
Calcium: 9 mg/dL (ref 8.9–10.3)
Chloride: 103 mmol/L (ref 98–111)
Creatinine, Ser: 0.51 mg/dL (ref 0.44–1.00)
GFR, Estimated: >60 mL/min (ref >60)
Glucose, Bld: 104 mg/dL — ABNORMAL HIGH (ref 70–99)
Potassium: 3.8 mmol/L (ref 3.5–5.1)
Sodium: 136 mmol/L (ref 135–145)
Total Bilirubin: 0.3 mg/dL (ref 0.3–1.2)
Total Protein: 7.9 g/dL (ref 6.5–8.1)

## 2021-04-21 LAB — CBC
HCT: 40 % (ref 36.0–46.0)
Hemoglobin: 13.2 g/dL (ref 12.0–15.0)
MCH: 27.1 pg (ref 26.0–34.0)
MCHC: 33 g/dL (ref 30.0–36.0)
MCV: 82.1 fL (ref 80.0–100.0)
Platelets: 284 10*3/uL (ref 150–400)
RBC: 4.87 MIL/uL (ref 3.87–5.11)
RDW: 13.1 % (ref 11.5–15.5)
WBC: 11.8 10*3/uL — ABNORMAL HIGH (ref 4.0–10.5)
nRBC: 0 % (ref 0.0–0.2)

## 2021-04-21 LAB — LIPASE, BLOOD: Lipase: 20 U/L (ref 11–51)

## 2021-04-21 LAB — URINALYSIS, ROUTINE W REFLEX MICROSCOPIC
Bilirubin Urine: NEGATIVE
Glucose, UA: NEGATIVE mg/dL
Ketones, ur: NEGATIVE mg/dL
Leukocytes,Ua: NEGATIVE
Nitrite: NEGATIVE
Protein, ur: NEGATIVE mg/dL
Specific Gravity, Urine: 1.02 (ref 1.005–1.030)
pH: 6 (ref 5.0–8.0)

## 2021-04-21 LAB — URINALYSIS, MICROSCOPIC (REFLEX)

## 2021-04-21 LAB — PREGNANCY, URINE: Preg Test, Ur: NEGATIVE

## 2021-04-21 MED ORDER — FAMOTIDINE 20 MG PO TABS: 20.0000 mg | ORAL_TABLET | Freq: Two times a day (BID) | ORAL | 0 refills | Status: AC

## 2021-04-21 NOTE — ED Provider Notes (Signed)
[facility] Provider Note   CSN: [id number] Arrival date & time: 04/21/21  1809     History Chief Complaint  Patient presents with   Abdominal Pain    Elizabeth Hooper is a 30 y.o. female.  Patient with history of C-section/tubal ligation, no other abdominal surgeries --presents to the emergency department for evaluation of epigastric pain.  Symptoms have been going on for about 7 days.  It is intermittent.  Patient has had occasional vomiting.  No real change with eating or drinking.  No diarrhea but has had some constipation and increased straining.  She has had a UTI in the past with increased frequency but no dysuria.  No treatments prior to arrival.  No chest pain or shortness of breath.  No fevers.  Pain radiates to her right side. The onset of this condition was acute. The course is constant. Aggravating factors: none. Alleviating factors: none.  Denies heavy NSAID, alcohol use.       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   Previous cesarean section complicating pregnancy [id number]   GBS bacteriuria 07/16/2013    Past Surgical History:  Procedure Laterality Date   CESAREAN SECTION     CESAREAN SECTION N/A 09/27/2013   Procedure: CESAREAN SECTION WITH BILATERAL TUBAL LIGATION;  Surgeon: Guss Bunde, MD;  Location: [facility];  Service: Obstetrics;  Laterality: N/A;   TUBAL LIGATION       OB History     Gravida  2   Para  2   Term  2   Preterm  0   AB      Living  2      SAB      IAB      Ectopic      Multiple      Live Births  2           Family History  Problem Relation Age of Onset   Muscular dystrophy Father    Hypertension Father      Social History   Tobacco Use   Smoking status: Never   Smokeless tobacco: Never  Vaping Use   Vaping Use: Never used  Substance Use Topics   Alcohol use: Yes    Comment: occ   Drug use: No    Home Medications Prior to Admission medications   Medication Sig Start Date End Date Taking? Authorizing Provider  benzonatate (TESSALON) 100 MG capsule Take 1 capsule (100 mg total) by mouth every 8 (eight) hours. 01/09/21   Caccavale, Sophia, PA-C  cyclobenzaprine (FLEXERIL) 10 MG tablet Take 1 tablet (10 mg total) by mouth 3 (three) times daily as needed for muscle spasms. 08/21/20   Molpus, John, MD  ferrous sulfate 325 (65 FE) MG EC tablet Take 325 mg by mouth 3 (three) times daily with meals.    [provider]  meloxicam (MOBIC) 15 MG tablet Take 1 tablet daily as needed for back pain. 08/21/20   Molpus, John, MD  Vitamin D, Ergocalciferol, (DRISDOL) 1.25 MG (50000 UNIT) CAPS capsule Take 50,000 Units by mouth once a week. 12/07/19   [provider]  cetirizine (ZYRTEC ALLERGY) 10 MG tablet Take 1 tablet (10  mg total) by mouth daily. 06/27/18 12/24/18  Blanchie Dessert, MD  famotidine (PEPCID) 20 MG tablet Take 1 tablet (20 mg total) by mouth 2 (two) times daily. 06/19/18 12/24/18  Henderly, Britni A, PA-C  fluticasone (FLONASE) 50 MCG/ACT nasal spray Place 2 sprays into both nostrils daily. 06/04/18 12/24/18  Rodell Perna A, PA-C  potassium chloride SA (K-DUR,KLOR-CON) 20 MEQ tablet Take 1 tablet (20 mEq total) by mouth daily. 07/28/18 12/24/18  Petrucelli, Glynda Jaeger, PA-C  prochlorperazine (COMPAZINE) 10 MG tablet Take 1 tablet (10 mg total) by mouth 2 (two) times daily as needed for nausea or vomiting. 03/12/18 12/24/18  Tegeler, Gwenyth Allegra, MD    Allergies    Hydrocodone, Penicillins, and Percocet [oxycodone-acetaminophen]  Review of Systems   Review of Systems  Constitutional:  Positive for fatigue. Negative for fever.  HENT:  Negative for rhinorrhea and sore throat.    Eyes:  Negative for redness.  Respiratory:  Negative for cough and shortness of breath.   Cardiovascular:  Negative for chest pain.  Gastrointestinal:  Positive for abdominal pain, constipation, nausea and vomiting. Negative for diarrhea.  Genitourinary:  Positive for frequency. Negative for dysuria, hematuria and urgency.  Musculoskeletal:  Negative for myalgias.  Skin:  Negative for rash.  Neurological:  Negative for headaches.   Physical Exam Updated Vital Signs BP 124/62 (BP Location: Right Arm)   Pulse 87   Temp 98.4 F (36.9 C) (Oral)   Resp 16   Ht 5\' 1"  (1.549 m)   Wt 107 kg   LMP 04/12/2021   SpO2 100%   BMI 44.59 kg/m   Physical Exam Vitals and nursing note reviewed.  Constitutional:      General: She is not in acute distress.    Appearance: She is well-developed.  HENT:     Head: Normocephalic and atraumatic.     Right Ear: External ear normal.     Left Ear: External ear normal.     Nose: Nose normal.  Eyes:     Conjunctiva/sclera: Conjunctivae normal.  Cardiovascular:     Rate and Rhythm: Normal rate and regular rhythm.     Heart sounds: No murmur heard. Pulmonary:     Effort: No respiratory distress.     Breath sounds: No wheezing, rhonchi or rales.  Abdominal:     Palpations: Abdomen is soft.     Tenderness: There is generalized abdominal tenderness (Mild). There is no guarding or rebound.  Musculoskeletal:     Cervical back: Normal range of motion and neck supple.     Right lower leg: No edema.     Left lower leg: No edema.  Skin:    General: Skin is warm and dry.     Findings: No rash.  Neurological:     General: No focal deficit present.     Mental Status: She is alert. Mental status is at baseline.     Motor: No weakness.  Psychiatric:        Mood and Affect: Mood normal.    ED Results / Procedures / Treatments   Labs (all labs ordered are listed, but only abnormal results are displayed) Labs Reviewed  COMPREHENSIVE METABOLIC PANEL -  Abnormal; Notable for the following components:      Result Value   Glucose, Bld 104 (*)    All other components within normal limits  CBC - Abnormal; Notable for the following components:   WBC 11.8 (*)    All other components within normal limits  URINALYSIS,  ROUTINE W REFLEX MICROSCOPIC - Abnormal; Notable for the following components:   Hgb urine dipstick SMALL (*)    All other components within normal limits  URINALYSIS, MICROSCOPIC (REFLEX) - Abnormal; Notable for the following components:   Bacteria, UA FEW (*)    All other components within normal limits  LIPASE, BLOOD  PREGNANCY, URINE    EKG None  Radiology US Abdomen Limited RUQ (LIVER/GB)  Result Date: 04/21/2021 CLINICAL DATA:  Epigastric pain, nausea/vomiting EXAM: ULTRASOUND ABDOMEN LIMITED RIGHT UPPER QUADRANT COMPARISON:  None. FINDINGS: Gallbladder: No gallstones or wall thickening visualized. No sonographic Murphy sign noted by sonographer. Common bile duct: Diameter: 3 mm Liver: Hyperechoic hepatic parenchyma, suggesting hepatic steatosis. No focal hepatic lesions seen. Portal vein is patent on color Doppler imaging with normal direction of blood flow towards the liver. Other: None. IMPRESSION: Possible hepatic steatosis. Electronically Signed   By: Charline Bills M.D.   On: 04/21/2021 19:28    Procedures Procedures   Medications Ordered in ED Medications - No data to display  ED Course  I have reviewed the triage vital signs and the nursing notes.  Pertinent labs & imaging results that were available during my care of the patient were reviewed by me and considered in my medical decision making (see chart for details).  Patient seen and examined. Work-up initiated.  Patient looks very comfortable at time of initial exam.  Vital signs reviewed and are as follows: BP 124/62 (BP Location: Right Arm)   Pulse 87   Temp 98.4 F (36.9 C) (Oral)   Resp 16   Ht 5\' 1"  (1.549 m)   Wt 107 kg   LMP 04/12/2021    SpO2 100%   BMI 44.59 kg/m   Patient updated on results.  At this point, we will treat with Pepcid and avoidance of anything that makes her symptoms worse.  Encourage PCP follow-up.  Patient asked about fatigue and I recommended PCP evaluation to check things like thyroid and other vitamin levels.  Discussed getting enough sleep.  8:49 PM The patient was urged to return to the Emergency Department immediately with worsening of current symptoms, worsening abdominal pain, persistent vomiting, blood noted in stools, fever, or any other concerns. The patient verbalized understanding.     MDM Rules/Calculators/A&P                           Patient with abdominal pain, epigastric. Vitals are stable, no fever. Labs mildly elevated white blood cell count, otherwise reassuring. Imaging right upper quadrant ultrasound negative for gallstones or cholecystitis. No signs of dehydration, patient is tolerating PO's. Lungs are clear and no signs suggestive of PNA. Low concern for appendicitis, cholecystitis, pancreatitis, ruptured viscus, UTI, kidney stone, aortic dissection, aortic aneurysm or other emergent abdominal etiology. Supportive therapy indicated with return if symptoms worsen.   Final Clinical Impression(s) / ED Diagnoses Final diagnoses:  Epigastric pain    Rx / DC Orders ED Discharge Orders          Ordered    famotidine (PEPCID) 20 MG tablet  2 times daily        04/21/21 2048             13/12/22, PA-C 04/21/21 2050    13/12/22, MD 04/21/21 2232

## 2021-04-21 NOTE — Discharge Instructions (Signed)
Please read and follow all provided instructions.  Your diagnoses today include:  1. Epigastric pain     Tests performed today include: Blood cell counts and platelets: Slightly elevated white blood cell count Kidney and liver function tests Pancreas function test (called lipase) Urine test to look for infection A blood or urine test for pregnancy (women only) Ultrasound that did not show any gallstones Vital signs. See below for your results today.   Medications prescribed:  Pepcid (famotidine) - antihistamine  You can find this medication over-the-counter.   DO NOT exceed:  20mg  Pepcid every 12 hours  Take any prescribed medications only as directed.  Home care instructions:  Follow any educational materials contained in this packet.  Follow-up instructions: Please follow-up with your primary care provider in the next 3 days for further evaluation of your symptoms.    Return instructions:  SEEK IMMEDIATE MEDICAL ATTENTION IF: The pain does not go away or becomes severe  A temperature above 101F develops  Repeated vomiting occurs (multiple episodes)  The pain becomes localized to portions of the abdomen. The right side could possibly be appendicitis. In an adult, the left lower portion of the abdomen could be colitis or diverticulitis.  Blood is being passed in stools or vomit (bright red or black tarry stools)  You develop chest pain, difficulty breathing, dizziness or fainting, or become confused, poorly responsive, or inconsolable (young children) If you have any other emergent concerns regarding your health  Additional Information: Abdominal (belly) pain can be caused by many things. Your caregiver performed an examination and possibly ordered blood/urine tests and imaging (CT scan, x-rays, ultrasound). Many cases can be observed and treated at home after initial evaluation in the emergency department. Even though you are being discharged home, abdominal pain can be  unpredictable. Therefore, you need a repeated exam if your pain does not resolve, returns, or worsens. Most patients with abdominal pain don't have to be admitted to the hospital or have surgery, but serious problems like appendicitis and gallbladder attacks can start out as nonspecific pain. Many abdominal conditions cannot be diagnosed in one visit, so follow-up evaluations are very important.  Your vital signs today were: BP 124/62 (BP Location: Right Arm)   Pulse 87   Temp 98.4 F (36.9 C) (Oral)   Resp 16   Ht 5\' 1"  (1.549 m)   Wt 107 kg   LMP 04/12/2021   SpO2 100%   BMI 44.59 kg/m  If your blood pressure (bp) was elevated above 135/85 this visit, please have this repeated by your doctor within one month. --------------

## 2021-04-21 NOTE — ED Triage Notes (Signed)
Pt c/o mid abd pain since Monday. Vomited x 1 upon arrival

## 2021-05-20 ENCOUNTER — Encounter (HOSPITAL_BASED_OUTPATIENT_CLINIC_OR_DEPARTMENT_OTHER): Payer: Self-pay

## 2021-05-20 ENCOUNTER — Emergency Department (HOSPITAL_BASED_OUTPATIENT_CLINIC_OR_DEPARTMENT_OTHER)
Admission: EM | Admit: 2021-05-20 | Discharge: 2021-05-20 | Disposition: A | Discharge status: Home / Self Care | Payer: Medicaid Other | Attending: Emergency Medicine | Admitting: Emergency Medicine

## 2021-05-20 ENCOUNTER — Other Ambulatory Visit: Payer: Self-pay

## 2021-05-20 DIAGNOSIS — Z79899 Other long term (current) drug therapy: Secondary | ICD-10-CM | POA: Insufficient documentation

## 2021-05-20 DIAGNOSIS — R0981 Nasal congestion: Secondary | ICD-10-CM | POA: Diagnosis not present

## 2021-05-20 DIAGNOSIS — Z20822 Contact with and (suspected) exposure to covid-19: Secondary | ICD-10-CM | POA: Diagnosis not present

## 2021-05-20 DIAGNOSIS — J029 Acute pharyngitis, unspecified: Secondary | ICD-10-CM | POA: Diagnosis present

## 2021-05-20 DIAGNOSIS — R059 Cough, unspecified: Secondary | ICD-10-CM | POA: Insufficient documentation

## 2021-05-20 LAB — RESP PANEL BY RT-PCR (FLU A&B, COVID) ARPGX2
Influenza A by PCR: NEGATIVE
Influenza B by PCR: NEGATIVE
SARS Coronavirus 2 by RT PCR: NEGATIVE

## 2021-05-20 LAB — GROUP A STREP BY PCR: Group A Strep by PCR: NOT DETECTED

## 2021-05-20 NOTE — ED Triage Notes (Signed)
Pt states that throat feels scratchy since today. Son has had same symptoms.

## 2021-05-20 NOTE — ED Provider Notes (Signed)
[facility] EMERGENCY DEPARTMENT Provider Note   CSN: [id number] Arrival date & time: 05/20/21  0140     History Chief Complaint  Patient presents with   Sore Throat    Elizabeth Hooper is a 30 y.o. female.  The history is provided by the patient.  Sore Throat This is a new problem. The current episode started 12 to 24 hours ago. The problem has been gradually worsening. Pertinent negatives include no abdominal pain and no shortness of breath. The symptoms are aggravated by swallowing. The symptoms are relieved by rest.  Patient reports scratchy throat and sore throat.  She reports mild congestion and cough.  No chest pain or shortness of breath.  No abdominal pain.  She reports sick contacts.    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   Previous cesarean section complicating pregnancy 07/22/2013   GBS bacteriuria 07/16/2013    Past Surgical History:  Procedure Laterality Date   CESAREAN SECTION     CESAREAN SECTION N/A 09/27/2013   Procedure: CESAREAN SECTION WITH BILATERAL TUBAL LIGATION;  Surgeon: Lesly Dukes, MD;  Location: [facility];  Service: Obstetrics;  Laterality: N/A;   TUBAL LIGATION       OB History     Gravida  2   Para  2   Term  2   Preterm  0   AB      Living  2      SAB      IAB      Ectopic      Multiple      Live Births  2           Family History  Problem Relation Age of Onset   Muscular dystrophy Father    Hypertension Father     Social History   Tobacco Use   Smoking status: Never   Smokeless tobacco: Never  Vaping Use   Vaping Use: Never used  Substance Use Topics   Alcohol use: Yes    Comment: occ   Drug use: No    Home  Medications Prior to Admission medications   Medication Sig Start Date End Date Taking? Authorizing Provider  famotidine (PEPCID) 20 MG tablet Take 1 tablet (20 mg total) by mouth 2 (two) times daily. 04/21/21   Renne Crigler, PA-C  ferrous sulfate 325 (65 FE) MG EC tablet Take 325 mg by mouth 3 (three) times daily with meals.    [provider]  meloxicam (MOBIC) 15 MG tablet Take 1 tablet daily as needed for back pain. 08/21/20   Molpus, John, MD  Vitamin D, Ergocalciferol, (DRISDOL) 1.25 MG (50000 UNIT) CAPS capsule Take 50,000 Units by mouth once a week. 12/07/19   [provider]  cetirizine (ZYRTEC ALLERGY) 10 MG tablet Take 1 tablet (10 mg total) by mouth daily. 06/27/18 12/24/18  Gwyneth Sprout, MD  fluticasone (FLONASE) 50 MCG/ACT nasal spray Place 2 sprays into both nostrils daily. 06/04/18 12/24/18  Michela Pitcher A, PA-C  potassium chloride SA (K-DUR,KLOR-CON) 20 MEQ tablet Take 1 tablet (20 mEq total) by mouth daily. 07/28/18 12/24/18  Petrucelli, Pleas Koch, PA-C  prochlorperazine (COMPAZINE) 10 MG tablet Take 1 tablet (10 mg total)  by mouth 2 (two) times daily as needed for nausea or vomiting. 03/12/18 12/24/18  Tegeler, Canary Brim, MD    Allergies    Hydrocodone, Penicillins, and Percocet [oxycodone-acetaminophen]  Review of Systems   Review of Systems  Constitutional:  Negative for fever.  HENT:  Positive for congestion and sore throat.   Respiratory:  Positive for cough. Negative for shortness of breath.   Gastrointestinal:  Negative for abdominal pain and vomiting.   Physical Exam Updated Vital Signs BP 122/66 (BP Location: Right Arm)   Pulse 76   Temp 98.1 F (36.7 C) (Oral)   Resp 17   Ht 1.575 m (5\' 2" )   Wt 105.7 kg   SpO2 95%   BMI 42.62 kg/m   Physical Exam CONSTITUTIONAL: Well developed/well nourished HEAD: Normocephalic/atraumatic EYES: EOMI/PERRL ENMT: Mucous membranes moist, uvula midline without erythema or exudate, no stridor no  drooling NECK: supple no meningeal signs CV: S1/S2 noted, no murmurs/rubs/gallops noted LUNGS: Lungs are clear to auscultation bilaterally, no apparent distress ABDOMEN: soft, obese NEURO: Pt is awake/alert/appropriate, moves all extremitiesx4.  No facial droop.   EXTREMITIES: full ROM SKIN: warm, color normal PSYCH: no abnormalities of mood noted, alert and oriented to situation  ED Results / Procedures / Treatments   Labs (all labs ordered are listed, but only abnormal results are displayed) Labs Reviewed  RESP PANEL BY RT-PCR (FLU A&B, COVID) ARPGX2  GROUP A STREP BY PCR    EKG None  Radiology No results found.  Procedures Procedures   Medications Ordered in ED Medications - No data to display  ED Course  I have reviewed the triage vital signs and the nursing notes.  Pertinent labs  results that were available during my care of the patient were reviewed by me and considered in my medical decision making (see chart for details).    MDM Rules/Calculators/A&P                           Strep and viral panel are both negative.  Patient is in no acute distress Will discharge home Final Clinical Impression(s) / ED Diagnoses Final diagnoses:  Viral pharyngitis    Rx / DC Orders ED Discharge Orders     None        , MD 05/20/21 [id number]

## 2021-06-09 ENCOUNTER — Other Ambulatory Visit: Payer: Self-pay

## 2021-06-09 ENCOUNTER — Encounter (HOSPITAL_BASED_OUTPATIENT_CLINIC_OR_DEPARTMENT_OTHER): Payer: Self-pay | Admitting: Emergency Medicine

## 2021-06-09 ENCOUNTER — Emergency Department (HOSPITAL_BASED_OUTPATIENT_CLINIC_OR_DEPARTMENT_OTHER)
Admission: EM | Admit: 2021-06-09 | Discharge: 2021-06-09 | Disposition: A | Discharge status: Home / Self Care | Payer: Medicaid Other | Attending: Emergency Medicine | Admitting: Emergency Medicine

## 2021-06-09 DIAGNOSIS — Z5321 Procedure and treatment not carried out due to patient leaving prior to being seen by health care provider: Secondary | ICD-10-CM | POA: Insufficient documentation

## 2021-06-09 DIAGNOSIS — R55 Syncope and collapse: Secondary | ICD-10-CM | POA: Insufficient documentation

## 2021-06-09 DIAGNOSIS — R42 Dizziness and giddiness: Secondary | ICD-10-CM | POA: Insufficient documentation

## 2021-06-09 LAB — URINALYSIS, MICROSCOPIC (REFLEX)

## 2021-06-09 LAB — URINALYSIS, ROUTINE W REFLEX MICROSCOPIC
Bilirubin Urine: NEGATIVE
Glucose, UA: NEGATIVE mg/dL
Ketones, ur: NEGATIVE mg/dL
Leukocytes,Ua: NEGATIVE
Nitrite: NEGATIVE
Protein, ur: NEGATIVE mg/dL
Specific Gravity, Urine: 1.015 (ref 1.005–1.030)
pH: 7.5 (ref 5.0–8.0)

## 2021-06-09 LAB — PREGNANCY, URINE: Preg Test, Ur: NEGATIVE

## 2021-06-09 LAB — CBG MONITORING, ED: Glucose-Capillary: 109 mg/dL — ABNORMAL HIGH (ref 70–99)

## 2021-06-09 NOTE — ED Triage Notes (Incomplete)
Pt arrives pov with c/o dizziness upon waking. Pt endorses "little, slight" HA. Pt aox4, bilaterally equal. Pt endorses anxiety

## 2021-06-09 NOTE — ED Provider Notes (Signed)
Emergency Medicine Provider Triage Evaluation Note  Elizabeth Hooper , a 30 y.o. female  was evaluated in triage.  Pt complains of lightheadedness.  Patient awoke with feelings of near syncope.  She has had this in the past.  She does have a history of anemia secondary to heavy periods.  He denies shortness of breath or vertigo  Review of Systems  Positive: Light headed Negative: sob  Physical Exam  BP 132/63 (BP Location: Left Arm)    Pulse 87    Temp 98.6 F (37 C) (Oral)    Resp 20    Ht 5\' 2"  (1.575 m)    Wt 105.7 kg    LMP 06/04/2021    SpO2 97%    BMI 42.62 kg/m  Gen:   Awake, no distress   Resp:  Normal effort  MSK:   Moves extremities without difficulty  Other:  anxious  Medical Decision Making  Medically screening exam initiated at 11:51 AM.  Appropriate orders placed.  Elizabeth Hooper was informed that the remainder of the evaluation will be completed by another provider, this initial triage assessment does not replace that evaluation, and the importance of remaining in the ED until their evaluation is complete.  Work up initiated.   Elizabeth Ripa, PA-C 06/09/21 1152    06/11/21, MD 06/09/21 [phone]

## 2021-06-22 ENCOUNTER — Other Ambulatory Visit: Payer: Self-pay

## 2021-06-22 ENCOUNTER — Encounter (HOSPITAL_BASED_OUTPATIENT_CLINIC_OR_DEPARTMENT_OTHER): Payer: Self-pay | Admitting: *Deleted

## 2021-06-22 ENCOUNTER — Emergency Department (HOSPITAL_BASED_OUTPATIENT_CLINIC_OR_DEPARTMENT_OTHER)
Admission: EM | Admit: 2021-06-22 | Discharge: 2021-06-22 | Disposition: A | Discharge status: Home / Self Care | Payer: Medicaid Other | Attending: Emergency Medicine | Admitting: Emergency Medicine

## 2021-06-22 DIAGNOSIS — R112 Nausea with vomiting, unspecified: Secondary | ICD-10-CM | POA: Insufficient documentation

## 2021-06-22 DIAGNOSIS — R5383 Other fatigue: Secondary | ICD-10-CM | POA: Insufficient documentation

## 2021-06-22 LAB — CBC WITH DIFFERENTIAL/PLATELET
Abs Immature Granulocytes: 0.03 10*3/uL (ref 0.00–0.07)
Basophils Absolute: 0 10*3/uL (ref 0.0–0.1)
Basophils Relative: 0 %
Eosinophils Absolute: 0.1 10*3/uL (ref 0.0–0.5)
Eosinophils Relative: 1 %
HCT: 39.7 % (ref 36.0–46.0)
Hemoglobin: 13.1 g/dL (ref 12.0–15.0)
Immature Granulocytes: 0 %
Lymphocytes Relative: 26 %
Lymphs Abs: 2.6 10*3/uL (ref 0.7–4.0)
MCH: 26.7 pg (ref 26.0–34.0)
MCHC: 33 g/dL (ref 30.0–36.0)
MCV: 81 fL (ref 80.0–100.0)
Monocytes Absolute: 0.5 10*3/uL (ref 0.1–1.0)
Monocytes Relative: 6 %
Neutro Abs: 6.6 10*3/uL (ref 1.7–7.7)
Neutrophils Relative %: 67 %
Platelets: 256 10*3/uL (ref 150–400)
RBC: 4.9 MIL/uL (ref 3.87–5.11)
RDW: 13.1 % (ref 11.5–15.5)
WBC: 9.8 10*3/uL (ref 4.0–10.5)
nRBC: 0 % (ref 0.0–0.2)

## 2021-06-22 LAB — LIPASE, BLOOD: Lipase: 21 U/L (ref 11–51)

## 2021-06-22 LAB — URINALYSIS, ROUTINE W REFLEX MICROSCOPIC
Bilirubin Urine: NEGATIVE
Glucose, UA: NEGATIVE mg/dL
Ketones, ur: NEGATIVE mg/dL
Leukocytes,Ua: NEGATIVE
Nitrite: NEGATIVE
Protein, ur: NEGATIVE mg/dL
Specific Gravity, Urine: 1.025 (ref 1.005–1.030)
pH: 7 (ref 5.0–8.0)

## 2021-06-22 LAB — COMPREHENSIVE METABOLIC PANEL
ALT: 25 U/L (ref 0–44)
AST: 24 U/L (ref 15–41)
Albumin: 3.6 g/dL (ref 3.5–5.0)
Alkaline Phosphatase: 79 U/L (ref 38–126)
Anion gap: 7 (ref 5–15)
BUN: 8 mg/dL (ref 6–20)
CO2: 22 mmol/L (ref 22–32)
Calcium: 8.4 mg/dL — ABNORMAL LOW (ref 8.9–10.3)
Chloride: 105 mmol/L (ref 98–111)
Creatinine, Ser: 0.53 mg/dL (ref 0.44–1.00)
GFR, Estimated: >60 mL/min (ref >60)
Glucose, Bld: 130 mg/dL — ABNORMAL HIGH (ref 70–99)
Potassium: 3.4 mmol/L — ABNORMAL LOW (ref 3.5–5.1)
Sodium: 134 mmol/L — ABNORMAL LOW (ref 135–145)
Total Bilirubin: 0.4 mg/dL (ref 0.3–1.2)
Total Protein: 7.4 g/dL (ref 6.5–8.1)

## 2021-06-22 LAB — URINALYSIS, MICROSCOPIC (REFLEX)

## 2021-06-22 LAB — HCG, SERUM, QUALITATIVE: Preg, Serum: NEGATIVE

## 2021-06-22 MED ORDER — ONDANSETRON HCL 4 MG/2ML IJ SOLN
4.0000 mg | Freq: Once | INTRAMUSCULAR | Status: AC
  Administered 2021-06-22: 4 mg via INTRAVENOUS
  Filled 2021-06-22: qty 2

## 2021-06-22 MED ORDER — ONDANSETRON 4 MG PO TBDP
4.0000 mg | ORAL_TABLET | Freq: Three times a day (TID) | ORAL | 0 refills | Status: AC | PRN
Start: 2021-06-22 — End: ?

## 2021-06-22 NOTE — Discharge Instructions (Addendum)
Your work-up today did not show significant abnormality.  You likely have a viral GI bug.  I have written you for some Zofran which is a nausea medicine.  Take as prescribed.  Return for new or worsening symptoms.

## 2021-06-22 NOTE — ED Triage Notes (Signed)
C/o n/v generalized fatigue x 3 days, others in home with same, pt drinking Gatorade in triage

## 2021-06-22 NOTE — ED Provider Notes (Signed)
[facility] Provider Note   CSN: [id number] Arrival date & time: 06/22/21  1557     History  Chief Complaint  Patient presents with   Vomiting    Elizabeth Hooper is a 31 y.o. female here for evaluation of fatigue, nausea and vomiting over the last 3 days.  Son did have a "GI bug" yesterday which resolved.  She denies chance of pregnancy.  No dysuria, hematuria.  Initially had some upper abdominal pain which resolved with vomiting.  No current abdominal pain.  No fever, cough, congestion, rhinorrhea.  No recent suspicious food intake.  No change in bowel movements, diarrhea.  HPI     Home Medications Prior to Admission medications   Medication Sig Start Date End Date Taking? Authorizing Provider  ondansetron (ZOFRAN-ODT) 4 MG disintegrating tablet Take 1 tablet (4 mg total) by mouth every 8 (eight) hours as needed for nausea or vomiting. 06/22/21  Yes Zeenat Jeanbaptiste A, PA-C  famotidine (PEPCID) 20 MG tablet Take 1 tablet (20 mg total) by mouth 2 (two) times daily. 04/21/21   Carlisle Cater, PA-C  ferrous sulfate 325 (65 FE) MG EC tablet Take 325 mg by mouth 3 (three) times daily with meals.    [provider]  meloxicam (MOBIC) 15 MG tablet Take 1 tablet daily as needed for back pain. 08/21/20   Molpus, John, MD  Vitamin D, Ergocalciferol, (DRISDOL) 1.25 MG (50000 UNIT) CAPS capsule Take 50,000 Units by mouth once a week. 12/07/19   [provider]  cetirizine (ZYRTEC ALLERGY) 10 MG tablet Take 1 tablet (10 mg total) by mouth daily. 06/27/18 12/24/18  Blanchie Dessert, MD  fluticasone (FLONASE) 50 MCG/ACT nasal spray Place 2 sprays into both nostrils daily. 06/04/18 12/24/18  Rodell Perna A, PA-C  potassium chloride SA (K-DUR,KLOR-CON) 20 MEQ tablet Take 1 tablet (20 mEq total) by mouth daily. 07/28/18 12/24/18  Petrucelli, Glynda Jaeger, PA-C  prochlorperazine (COMPAZINE) 10 MG tablet Take 1 tablet (10 mg total) by mouth 2 (two) times daily as  needed for nausea or vomiting. 03/12/18 12/24/18  Tegeler, Gwenyth Allegra, MD      Allergies    Hydrocodone, Penicillins, and Percocet [oxycodone-acetaminophen]    Review of Systems   Review of Systems  Constitutional:  Positive for fatigue.  HENT: Negative.    Respiratory:  Negative for cough.   Cardiovascular: Negative.   Gastrointestinal:  Positive for abdominal pain (resolved), nausea and vomiting. Negative for constipation and diarrhea.  Genitourinary: Negative.   Neurological: Negative.    Physical Exam Updated Vital Signs BP 118/66    Pulse 98    Temp 98 F (36.7 C) (Oral)    Resp 18    Ht 5\' 2"  (1.575 m)    Wt 105.7 kg    LMP 06/04/2021    SpO2 99%    BMI 42.62 kg/m  Physical Exam Vitals and nursing note reviewed.  Constitutional:      General: She is not in acute distress.    Appearance: She is well-developed. She is not ill-appearing, toxic-appearing or diaphoretic.  HENT:     Head: Normocephalic and atraumatic.     Nose: Nose normal. No congestion or rhinorrhea.     Mouth/Throat:     Mouth: Mucous membranes are moist.  Eyes:     Pupils: Pupils are equal, round, and reactive to light.  Cardiovascular:     Rate and Rhythm: Normal rate.     Pulses: Normal pulses.     Heart  sounds: Normal heart sounds.  Pulmonary:     Effort: No respiratory distress.     Breath sounds: Normal breath sounds.  Abdominal:     General: Bowel sounds are normal. There is no distension.     Palpations: Abdomen is soft.     Tenderness: There is no abdominal tenderness. There is no right CVA tenderness, left CVA tenderness, guarding or rebound.     Comments: Soft, nontender throughout no guarding.  Negative Murphy sign.  Musculoskeletal:        General: Normal range of motion.     Cervical back: Normal range of motion.  Skin:    General: Skin is warm and dry.     Capillary Refill: Capillary refill takes less than 2 seconds.  Neurological:     General: No focal deficit present.      Mental Status: She is alert and oriented to person, place, and time.  Psychiatric:        Mood and Affect: Mood normal.    ED Results / Procedures / Treatments   Labs (all labs ordered are listed, but only abnormal results are displayed) Labs Reviewed  COMPREHENSIVE METABOLIC PANEL - Abnormal; Notable for the following components:      Result Value   Sodium 134 (*)    Potassium 3.4 (*)    Glucose, Bld 130 (*)    Calcium 8.4 (*)    All other components within normal limits  URINALYSIS, ROUTINE W REFLEX MICROSCOPIC - Abnormal; Notable for the following components:   Hgb urine dipstick TRACE (*)    All other components within normal limits  URINALYSIS, MICROSCOPIC (REFLEX) - Abnormal; Notable for the following components:   Bacteria, UA MANY (*)    All other components within normal limits  CBC WITH DIFFERENTIAL/PLATELET  LIPASE, BLOOD  HCG, SERUM, QUALITATIVE    EKG None  Radiology No results found.  Procedures Procedures    Medications Ordered in ED Medications  ondansetron (ZOFRAN) injection 4 mg (4 mg Intravenous Given 06/22/21 1758)    ED Course/ Medical Decision Making/ A&P    Pleasant 31 year old here for evaluation of nausea and vomiting over the last 3 days.  Approximately 4 episodes of NBNB emesis.  Initially had some generalized upper abdominal cramping at onset of nausea which resolved with last emesis.  She has no current abdominal pain.  Her abdomen is soft, nontender.  Denies any urinary complaints.  She denies chance of pregnancy.  No cough, congestion, rhinorrhea, concern for COVID or flu.  We will plan on checking labs, urine, Zofran and reassess  Labs personally reviewed and interpreted:  CBC without leukocytosis  CMP potassium 3.7, sodium 134 Lipase 21 UA bacteria no nitrate, leuks Preg negative   Patient reassessed.  Discussed labs and imaging.  Suspect viral gastroenteritis.  She is tolerating p.o. intake here in the emergency department.  Dc  home with symptomatic management  Patient is nontoxic, nonseptic appearing, in no apparent distress.  Patient's pain and other symptoms adequately managed in emergency department.  Fluid bolus given.  Labs, imaging and vitals reviewed.  Patient does not meet the SIRS or Sepsis criteria.  On repeat exam patient does not have a surgical abdomin and there are no peritoneal signs.  No indication of appendicitis, bowel obstruction, bowel perforation, cholecystitis, diverticulitis, PID or ectopic pregnancy.  Patient discharged home with symptomatic treatment and given strict instructions for follow-up with their primary care physician.  I have also discussed reasons to return immediately to the  ER.  Patient expresses understanding and agrees with plan.                            Medical Decision Making Amount and/or Complexity of Data Reviewed Labs: ordered. Decision-making details documented in ED Course. Radiology: ordered and independent interpretation performed. Decision-making details documented in ED Course.  Risk OTC drugs. Prescription drug management. Diagnosis or treatment significantly limited by social determinants of health. Risk Details: No PCP          Final Clinical Impression(s) / ED Diagnoses Final diagnoses:  Nausea and vomiting, unspecified vomiting type    Rx / DC Orders ED Discharge Orders          Ordered    ondansetron (ZOFRAN-ODT) 4 MG disintegrating tablet  Every 8 hours PRN        06/22/21 1922              Nasiir Monts A, PA-C 06/22/21 1927    Hayden Rasmussen, MD 06/23/21 1021

## 2021-06-22 NOTE — ED Notes (Signed)
Fluid challenge complete. Pt is able to keep fluids down without complaint.

## 2022-08-10 ENCOUNTER — Encounter (HOSPITAL_BASED_OUTPATIENT_CLINIC_OR_DEPARTMENT_OTHER): Payer: Self-pay | Admitting: Emergency Medicine

## 2022-08-10 ENCOUNTER — Other Ambulatory Visit: Payer: Self-pay

## 2022-08-10 ENCOUNTER — Emergency Department (HOSPITAL_BASED_OUTPATIENT_CLINIC_OR_DEPARTMENT_OTHER)
Admission: EM | Admit: 2022-08-10 | Discharge: 2022-08-10 | Disposition: A | Discharge status: Home / Self Care | Payer: Medicaid Other | Attending: Emergency Medicine | Admitting: Emergency Medicine

## 2022-08-10 DIAGNOSIS — R059 Cough, unspecified: Secondary | ICD-10-CM | POA: Diagnosis present

## 2022-08-10 DIAGNOSIS — J069 Acute upper respiratory infection, unspecified: Secondary | ICD-10-CM

## 2022-08-10 DIAGNOSIS — Z20822 Contact with and (suspected) exposure to covid-19: Secondary | ICD-10-CM | POA: Insufficient documentation

## 2022-08-10 LAB — RESP PANEL BY RT-PCR (RSV, FLU A&B, COVID)  RVPGX2
Influenza A by PCR: NEGATIVE
Influenza B by PCR: NEGATIVE
Resp Syncytial Virus by PCR: NEGATIVE
SARS Coronavirus 2 by RT PCR: NEGATIVE

## 2022-08-10 LAB — GROUP A STREP BY PCR: Group A Strep by PCR: NOT DETECTED

## 2022-08-10 MED ORDER — IBUPROFEN 400 MG PO TABS
400.0000 mg | ORAL_TABLET | Freq: Once | ORAL | Status: AC
  Administered 2022-08-10: 400 mg via ORAL
  Filled 2022-08-10: qty 1

## 2022-08-10 NOTE — ED Provider Notes (Signed)
[facility] Provider Note   CSN: [id number] Arrival date & time: 08/10/22  1958     History  Chief Complaint  Patient presents with   Cough   Sore Throat    Elizabeth Hooper is a 32 y.o. female.  The history is provided by the patient.  Cough Cough characteristics:  Non-productive Severity:  Moderate Onset quality:  Gradual Duration:  4 days Timing:  Intermittent Progression:  Unchanged Chronicity:  New Context: upper respiratory infection   Relieved by:  Nothing Worsened by:  Nothing Associated symptoms: sore throat   Associated symptoms: no fever   Risk factors: no chemical exposure        Home Medications Prior to Admission medications   Medication Sig Start Date End Date Taking? Authorizing Provider  famotidine (PEPCID) 20 MG tablet Take 1 tablet (20 mg total) by mouth 2 (two) times daily. 04/21/21   Carlisle Cater, PA-C  ferrous sulfate 325 (65 FE) MG EC tablet Take 325 mg by mouth 3 (three) times daily with meals.    [provider]  meloxicam (MOBIC) 15 MG tablet Take 1 tablet daily as needed for back pain. 08/21/20   Molpus, John, MD  ondansetron (ZOFRAN-ODT) 4 MG disintegrating tablet Take 1 tablet (4 mg total) by mouth every 8 (eight) hours as needed for nausea or vomiting. 06/22/21   Henderly, Britni A, PA-C  Vitamin D, Ergocalciferol, (DRISDOL) 1.25 MG (50000 UNIT) CAPS capsule Take 50,000 Units by mouth once a week. 12/07/19   [provider]  cetirizine (ZYRTEC ALLERGY) 10 MG tablet Take 1 tablet (10 mg total) by mouth daily. 06/27/18 12/24/18  Blanchie Dessert, MD  fluticasone (FLONASE) 50 MCG/ACT nasal spray Place 2 sprays into both nostrils daily. 06/04/18 12/24/18  Rodell Perna A, PA-C  potassium chloride SA (K-DUR,KLOR-CON) 20 MEQ tablet Take 1 tablet (20 mEq total) by mouth daily. 07/28/18 12/24/18  Petrucelli, Glynda Jaeger, PA-C  prochlorperazine (COMPAZINE) 10 MG tablet Take 1 tablet (10 mg  total) by mouth 2 (two) times daily as needed for nausea or vomiting. 03/12/18 12/24/18  Tegeler, Gwenyth Allegra, MD      Allergies    Hydrocodone, Penicillins, and Percocet [oxycodone-acetaminophen]    Review of Systems   Review of Systems  Constitutional:  Negative for fever.  HENT:  Positive for sore throat.   Eyes:  Negative for redness.  Respiratory:  Positive for cough.   All other systems reviewed and are negative.   Physical Exam Updated Vital Signs BP (!) 142/85   Pulse 99   Temp 98.3 F (36.8 C) (Oral)   Resp 15   Ht '5\' 1"'$  (1.549 m)   Wt 104.8 kg   LMP 07/27/2022   SpO2 99%   BMI 43.65 kg/m  Physical Exam Vitals and nursing note reviewed.  Constitutional:      General: She is not in acute distress.    Appearance: She is well-developed.  HENT:     Head: Normocephalic and atraumatic.     Nose: Nose normal.     Mouth/Throat:     Mouth: Mucous membranes are moist.     Pharynx: Oropharynx is clear. No oropharyngeal exudate.  Eyes:     Pupils: Pupils are equal, round, and reactive to light.  Cardiovascular:     Rate and Rhythm: Normal rate and regular rhythm.     Pulses: Normal pulses.     Heart sounds: Normal heart sounds.  Pulmonary:  Effort: Pulmonary effort is normal. No respiratory distress.     Breath sounds: Normal breath sounds.  Abdominal:     General: Bowel sounds are normal. There is no distension.     Palpations: Abdomen is soft.     Tenderness: There is no abdominal tenderness. There is no guarding or rebound.  Genitourinary:    Vagina: No vaginal discharge.  Musculoskeletal:        General: Normal range of motion.     Cervical back: Normal range of motion and neck supple.  Lymphadenopathy:     Cervical: No cervical adenopathy.  Skin:    General: Skin is dry.     Capillary Refill: Capillary refill takes less than 2 seconds.     Findings: No erythema or rash.  Neurological:     General: No focal deficit present.     Deep Tendon  Reflexes: Reflexes normal.  Psychiatric:        Mood and Affect: Mood normal.     ED Results / Procedures / Treatments   Labs (all labs ordered are listed, but only abnormal results are displayed) Labs Reviewed  RESP PANEL BY RT-PCR (RSV, FLU A&B, COVID)  RVPGX2  GROUP A STREP BY PCR    EKG None  Radiology No results found.  Procedures Procedures    Medications Ordered in ED Medications  ibuprofen (ADVIL) tablet 400 mg (has no administration in time range)    ED Course/ Medical Decision Making/ A&P                             Medical Decision Making Sore throat and cough x 4 days   Amount and/or Complexity of Data Reviewed External Data Reviewed: notes.    Details: Previous notes  Labs: ordered.    Details: Negative covid and strep   Risk Prescription drug management. Risk Details: Well appearing, normal exam.  This is a viral URI.  Based on [facility] there is no indication for further testing or treatment.  Stable for discharge.  Strict returns.      Final Clinical Impression(s) / ED Diagnoses Final diagnoses:  Viral upper respiratory tract infection  Return for intractable cough, coughing up blood, fevers > 100.4 unrelieved by medication, shortness of breath, intractable vomiting, chest pain, shortness of breath, weakness, numbness, changes in speech, facial asymmetry, abdominal pain, passing out, Inability to tolerate liquids or food, cough, altered mental status or any concerns. No signs of systemic illness or infection. The patient is nontoxic-appearing on exam and vital signs are within normal limits.  I have reviewed the triage vital signs and the nursing notes. Pertinent labs & imaging results that were available during my care of the patient were reviewed by me and considered in my medical decision making (see chart for details). After history, exam, and medical workup I feel the patient has been appropriately medically screened and is safe for discharge  home. Pertinent diagnoses were discussed with the patient. Patient was given return precautions  Rx / DC Orders ED Discharge Orders     None         Husam Hohn, MD 08/10/22 2310

## 2022-08-10 NOTE — ED Triage Notes (Signed)
Pt w/ cough and sore throat x few days; kids have strep

## 2023-03-27 ENCOUNTER — Other Ambulatory Visit: Payer: Self-pay

## 2023-03-27 ENCOUNTER — Encounter (HOSPITAL_BASED_OUTPATIENT_CLINIC_OR_DEPARTMENT_OTHER): Payer: Self-pay | Admitting: Urology

## 2023-03-27 ENCOUNTER — Emergency Department (HOSPITAL_BASED_OUTPATIENT_CLINIC_OR_DEPARTMENT_OTHER)
Admission: EM | Admit: 2023-03-27 | Discharge: 2023-03-27 | Discharge status: Against Medical Advice | Payer: Medicaid Other | Attending: Emergency Medicine | Admitting: Emergency Medicine

## 2023-03-27 DIAGNOSIS — F419 Anxiety disorder, unspecified: Secondary | ICD-10-CM | POA: Insufficient documentation

## 2023-03-27 DIAGNOSIS — Z5321 Procedure and treatment not carried out due to patient leaving prior to being seen by health care provider: Secondary | ICD-10-CM | POA: Diagnosis not present

## 2023-03-27 NOTE — ED Triage Notes (Addendum)
Pt states was driving and felt cheeks and arms got numb approx 30 min ago  States feeling has returned, feels anxious now  States " I might be having a panic attack"   H/o Charcot-marie disease

## 2023-03-27 NOTE — ED Notes (Signed)
Called for x 2 in lobby with no answer  ?

## 2023-03-31 NOTE — Plan of Care (Signed)
[facility] Tonsillectomy/Adenoidectomy, Postoperative PEDS care plan entered in error.

## 2024-06-02 ENCOUNTER — Other Ambulatory Visit: Payer: Self-pay

## 2024-06-02 ENCOUNTER — Encounter (HOSPITAL_BASED_OUTPATIENT_CLINIC_OR_DEPARTMENT_OTHER): Payer: Self-pay | Admitting: Emergency Medicine

## 2024-06-02 ENCOUNTER — Emergency Department (HOSPITAL_BASED_OUTPATIENT_CLINIC_OR_DEPARTMENT_OTHER)
Admission: EM | Admit: 2024-06-02 | Discharge: 2024-06-03 | Disposition: A | Discharge status: Home / Self Care | Attending: Emergency Medicine | Admitting: Emergency Medicine

## 2024-06-02 DIAGNOSIS — R059 Cough, unspecified: Secondary | ICD-10-CM | POA: Diagnosis present

## 2024-06-02 DIAGNOSIS — J101 Influenza due to other identified influenza virus with other respiratory manifestations: Secondary | ICD-10-CM | POA: Insufficient documentation

## 2024-06-02 DIAGNOSIS — E876 Hypokalemia: Secondary | ICD-10-CM | POA: Diagnosis not present

## 2024-06-02 LAB — BASIC METABOLIC PANEL WITH GFR
Anion gap: 16 — ABNORMAL HIGH (ref 5–15)
BUN: 7 mg/dL (ref 6–20)
CO2: 21 mmol/L — ABNORMAL LOW (ref 22–32)
Calcium: 8.5 mg/dL — ABNORMAL LOW (ref 8.9–10.3)
Chloride: 99 mmol/L (ref 98–111)
Creatinine, Ser: 0.5 mg/dL (ref 0.44–1.00)
GFR, Estimated: >60 mL/min
Glucose, Bld: 142 mg/dL — ABNORMAL HIGH (ref 70–99)
Potassium: 3.2 mmol/L — ABNORMAL LOW (ref 3.5–5.1)
Sodium: 136 mmol/L (ref 135–145)

## 2024-06-02 LAB — RESP PANEL BY RT-PCR (RSV, FLU A&B, COVID)  RVPGX2
Influenza A by PCR: POSITIVE — AB
Influenza B by PCR: NEGATIVE
Resp Syncytial Virus by PCR: NEGATIVE
SARS Coronavirus 2 by RT PCR: NEGATIVE

## 2024-06-02 LAB — CBC
HCT: 38 % (ref 36.0–46.0)
Hemoglobin: 12.2 g/dL (ref 12.0–15.0)
MCH: 25.2 pg — ABNORMAL LOW (ref 26.0–34.0)
MCHC: 32.1 g/dL (ref 30.0–36.0)
MCV: 78.5 fL — ABNORMAL LOW (ref 80.0–100.0)
Platelets: 261 K/uL (ref 150–400)
RBC: 4.84 MIL/uL (ref 3.87–5.11)
RDW: 13.7 % (ref 11.5–15.5)
WBC: 9.3 K/uL (ref 4.0–10.5)
nRBC: 0 % (ref 0.0–0.2)

## 2024-06-02 NOTE — ED Triage Notes (Signed)
 Pt c/o productive cough x 3-4 days.  Recent flu exposure.  Reports feeling dehydrated, mouth dry, lips peeling. Known flu exposure earlier this week.   Good po intake. Also c/o increased urinary frequency.   Denies fever.

## 2024-06-03 MED ORDER — POTASSIUM CHLORIDE CRYS ER 20 MEQ PO TBCR
20.0000 meq | EXTENDED_RELEASE_TABLET | Freq: Once | ORAL | Status: AC
  Administered 2024-06-03: 20 meq via ORAL
  Filled 2024-06-03: qty 1

## 2024-06-03 MED ORDER — BENZONATATE 100 MG PO CAPS: 100.0000 mg | ORAL_CAPSULE | Freq: Three times a day (TID) | ORAL | 0 refills | Status: AC | PRN

## 2024-06-03 MED ORDER — BENZONATATE 100 MG PO CAPS
200.0000 mg | ORAL_CAPSULE | Freq: Once | ORAL | Status: AC
  Administered 2024-06-03: 200 mg via ORAL
  Filled 2024-06-03: qty 2

## 2024-06-03 NOTE — ED Provider Notes (Signed)
 " [facility] Provider Note   CSN: [id number] Arrival date & time: 06/02/24  2051     Patient presents with: Cough and Urinary Frequency   Elizabeth Hooper is a 33 y.o. female.   The history is provided by the patient and medical records.  Cough Urinary Frequency  Elizabeth Hooper is a 33 y.o. female who presents to the Emergency Department complaining of cough.  She presents to the emergency department for evaluation of illness that started on Friday.  She reports cough, urinary frequency, dry and cracked lips.  She is concerned that she might be dehydrated.  She is drinking fluids well.  She has not subjective fevers.  She does have some diarrhea but no vomiting.  Her son recently tested positive for influenza.  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  No dysuria. Denies any chance of pregnancy.     Prior to Admission medications  Medication Sig Start Date End Date Taking? Authorizing Provider  benzonatate  (TESSALON ) 100 MG capsule Take 1 capsule (100 mg total) by mouth 3 (three) times daily as needed. 06/03/24  Yes Griselda Norris, MD  famotidine  (PEPCID ) 20 MG tablet Take 1 tablet (20 mg total) by mouth 2 (two) times daily. 04/21/21   Geiple, Joshua, PA-C  ferrous sulfate 325 (65 FE) MG EC tablet Take 325 mg by mouth 3 (three) times daily with meals.    [provider]  meloxicam  (MOBIC ) 15 MG tablet Take 1 tablet daily as needed for back pain. 08/21/20   Molpus, Norleen, MD  ondansetron  (ZOFRAN -ODT) 4 MG disintegrating tablet Take 1 tablet (4 mg total) by mouth every 8 (eight) hours as needed for nausea or vomiting. 06/22/21   Henderly, Britni A, PA-C  Vitamin D, Ergocalciferol, (DRISDOL) 1.25 MG (50000 UNIT) CAPS capsule Take 50,000 Units by mouth once a week. 12/07/19   [provider]  cetirizine  (ZYRTEC  ALLERGY) 10 MG tablet Take 1 tablet (10 mg total) by mouth daily.  06/27/18 12/24/18  Doretha Folks, MD  fluticasone  (FLONASE ) 50 MCG/ACT nasal spray Place 2 sprays into both nostrils daily. 06/04/18 12/24/18  Fawze, Mina A, PA-C  potassium chloride  SA (K-DUR,KLOR-CON ) 20 MEQ tablet Take 1 tablet (20 mEq total) by mouth daily. 07/28/18 12/24/18  Petrucelli, Samantha R, PA-C  prochlorperazine  (COMPAZINE ) 10 MG tablet Take 1 tablet (10 mg total) by mouth 2 (two) times daily as needed for nausea or vomiting. 03/12/18 12/24/18  Tegeler, Lonni JINNY, MD    Allergies: Hydrocodone, Penicillins, and Percocet [oxycodone -acetaminophen ]    Review of Systems  Respiratory:  Positive for cough.   Genitourinary:  Positive for frequency.  All other systems reviewed and are negative.   Updated Vital Signs BP 132/75   Pulse 98   Temp 98.4 F (36.9 C) (Oral)   Resp 20   SpO2 100%   Physical Exam Vitals and nursing note reviewed.  Constitutional:      Appearance: She is well-developed.  HENT:     Head: Normocephalic and atraumatic.  Cardiovascular:     Rate and Rhythm: Normal rate and regular rhythm.     Heart sounds: No murmur heard. Pulmonary:     Effort: Pulmonary effort is normal. No respiratory distress.     Breath sounds: Normal breath sounds.  Abdominal:     Palpations: Abdomen is soft.     Tenderness: There is no abdominal tenderness. There is no guarding or rebound.  Musculoskeletal:        General: No tenderness.  Skin:    General: Skin is warm and dry.  Neurological:     Mental Status: She is alert and oriented to person, place, and time.  Psychiatric:        Behavior: Behavior normal.     (all labs ordered are listed, but only abnormal results are displayed) Labs Reviewed  RESP PANEL BY RT-PCR (RSV, FLU A&B, COVID)  RVPGX2 - Abnormal; Notable for the following components:      Result Value   Influenza A by PCR POSITIVE (*)    All other components within normal limits  BASIC METABOLIC PANEL WITH GFR - Abnormal; Notable for the following  components:   Potassium 3.2 (*)    CO2 21 (*)    Glucose, Bld 142 (*)    Calcium 8.5 (*)    Anion gap 16 (*)    All other components within normal limits  CBC - Abnormal; Notable for the following components:   MCV 78.5 (*)    MCH 25.2 (*)    All other components within normal limits  URINALYSIS, ROUTINE W REFLEX MICROSCOPIC  PREGNANCY, URINE    EKG: None  Radiology: No results found.   Procedures   Medications Ordered in the ED  potassium chloride  SA (KLOR-CON  M) CR tablet 20 mEq (has no administration in time range)  benzonatate  (TESSALON ) capsule 200 mg (has no administration in time range)                                    Medical Decision Making Amount and/or Complexity of Data Reviewed Labs: ordered.  Risk Prescription drug management.   Patient here for evaluation of dry lips, cough.  She is nontoxic-appearing on evaluation.  She does appear hydrated.  Labs significant for hypokalemia, history of same in the past.  She is positive for influenza.  No evidence of secondary bacterial infection.  Discussed with patient oral fluid hydration, home care for influenza.  Discussed close outpatient follow-up for progressive or concerning symptoms as well as return precautions.     Final diagnoses:  Influenza A  Hypokalemia    ED Discharge Orders          Ordered    benzonatate  (TESSALON ) 100 MG capsule  3 times daily PRN        06/03/24 0010               Griselda Norris, MD 06/03/24 0014  "
